# Patient Record
Sex: Female | Born: 1975 | Race: White | Hispanic: No | Marital: Single | Smoking: Current every day smoker
Health system: Southern US, Community
[De-identification: ages and names within clinical notes are randomized; demographics above are authoritative.]

## PROBLEM LIST (undated history)

## (undated) DIAGNOSIS — E7800 Pure hypercholesterolemia, unspecified: Secondary | ICD-10-CM

## (undated) DIAGNOSIS — I499 Cardiac arrhythmia, unspecified: Secondary | ICD-10-CM

## (undated) DIAGNOSIS — I10 Essential (primary) hypertension: Secondary | ICD-10-CM

## (undated) DIAGNOSIS — Z91199 Patient's noncompliance with other medical treatment and regimen due to unspecified reason: Secondary | ICD-10-CM

## (undated) DIAGNOSIS — F319 Bipolar disorder, unspecified: Secondary | ICD-10-CM

## (undated) DIAGNOSIS — F329 Major depressive disorder, single episode, unspecified: Secondary | ICD-10-CM

## (undated) DIAGNOSIS — Z9119 Patient's noncompliance with other medical treatment and regimen: Secondary | ICD-10-CM

## (undated) DIAGNOSIS — F209 Schizophrenia, unspecified: Secondary | ICD-10-CM

## (undated) DIAGNOSIS — J45909 Unspecified asthma, uncomplicated: Secondary | ICD-10-CM

## (undated) DIAGNOSIS — F419 Anxiety disorder, unspecified: Secondary | ICD-10-CM

## (undated) DIAGNOSIS — F32A Depression, unspecified: Secondary | ICD-10-CM

## (undated) DIAGNOSIS — I4891 Unspecified atrial fibrillation: Secondary | ICD-10-CM

## (undated) HISTORY — PX: TONSILLECTOMY: SUR1361

---
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
## 2002-04-27 ENCOUNTER — Emergency Department (HOSPITAL_COMMUNITY): Admission: EM | Admit: 2002-04-27 | Discharge: 2002-04-28 | Payer: Self-pay

## 2002-08-14 ENCOUNTER — Emergency Department (HOSPITAL_COMMUNITY): Admission: EM | Admit: 2002-08-14 | Discharge: 2002-08-14 | Payer: Self-pay | Admitting: Emergency Medicine

## 2002-11-05 ENCOUNTER — Emergency Department (HOSPITAL_COMMUNITY): Admission: EM | Admit: 2002-11-05 | Discharge: 2002-11-05 | Payer: Self-pay | Admitting: Emergency Medicine

## 2002-11-22 ENCOUNTER — Ambulatory Visit (HOSPITAL_COMMUNITY): Admission: RE | Admit: 2002-11-22 | Discharge: 2002-11-22 | Payer: Self-pay | Admitting: Orthopaedic Surgery

## 2003-05-01 ENCOUNTER — Emergency Department (HOSPITAL_COMMUNITY): Admission: EM | Admit: 2003-05-01 | Discharge: 2003-05-01 | Payer: Self-pay | Admitting: Emergency Medicine

## 2003-11-20 ENCOUNTER — Emergency Department (HOSPITAL_COMMUNITY): Admission: EM | Admit: 2003-11-20 | Discharge: 2003-11-21 | Payer: Self-pay | Admitting: Emergency Medicine

## 2004-02-17 ENCOUNTER — Emergency Department (HOSPITAL_COMMUNITY): Admission: EM | Admit: 2004-02-17 | Discharge: 2004-02-17 | Payer: Self-pay | Admitting: Emergency Medicine

## 2004-10-11 ENCOUNTER — Emergency Department (HOSPITAL_COMMUNITY): Admission: EM | Admit: 2004-10-11 | Discharge: 2004-10-11 | Payer: Self-pay | Admitting: Emergency Medicine

## 2005-02-23 ENCOUNTER — Emergency Department (HOSPITAL_COMMUNITY): Admission: EM | Admit: 2005-02-23 | Discharge: 2005-02-24 | Payer: Self-pay | Admitting: *Deleted

## 2005-05-16 ENCOUNTER — Emergency Department (HOSPITAL_COMMUNITY): Admission: EM | Admit: 2005-05-16 | Discharge: 2005-05-16 | Payer: Self-pay | Admitting: Emergency Medicine

## 2008-03-27 ENCOUNTER — Emergency Department (HOSPITAL_COMMUNITY): Admission: EM | Admit: 2008-03-27 | Discharge: 2008-03-27 | Payer: Self-pay | Admitting: Emergency Medicine

## 2008-08-11 ENCOUNTER — Emergency Department (HOSPITAL_COMMUNITY): Admission: EM | Admit: 2008-08-11 | Discharge: 2008-08-11 | Payer: Self-pay | Admitting: Emergency Medicine

## 2008-11-11 ENCOUNTER — Emergency Department (HOSPITAL_COMMUNITY): Admission: EM | Admit: 2008-11-11 | Discharge: 2008-11-11 | Payer: Self-pay | Admitting: Emergency Medicine

## 2009-11-25 ENCOUNTER — Emergency Department (HOSPITAL_COMMUNITY): Admission: EM | Admit: 2009-11-25 | Discharge: 2009-11-25 | Payer: Self-pay | Admitting: Emergency Medicine

## 2010-04-22 ENCOUNTER — Emergency Department (HOSPITAL_COMMUNITY): Admission: EM | Admit: 2010-04-22 | Discharge: 2010-04-22 | Payer: Self-pay | Admitting: Emergency Medicine

## 2010-07-10 ENCOUNTER — Emergency Department (HOSPITAL_COMMUNITY): Admission: EM | Admit: 2010-07-10 | Discharge: 2010-07-10 | Payer: Self-pay | Admitting: Emergency Medicine

## 2010-11-20 LAB — URINE MICROSCOPIC-ADD ON

## 2010-11-20 LAB — URINALYSIS, ROUTINE W REFLEX MICROSCOPIC
Glucose, UA: 500 mg/dL — AB
Leukocytes, UA: NEGATIVE
Protein, ur: 100 mg/dL — AB
Specific Gravity, Urine: >1.03 — ABNORMAL HIGH (ref 1.005–1.030)
Urobilinogen, UA: 0.2 mg/dL (ref 0.0–1.0)

## 2010-11-20 LAB — DIFFERENTIAL
Basophils Absolute: 0 10*3/uL (ref 0.0–0.1)
Basophils Relative: 0 % (ref 0–1)
Eosinophils Absolute: 0.8 10*3/uL — ABNORMAL HIGH (ref 0.0–0.7)
Eosinophils Relative: 6 % — ABNORMAL HIGH (ref 0–5)
Monocytes Absolute: 0.4 10*3/uL (ref 0.1–1.0)
Monocytes Relative: 3 % (ref 3–12)
Neutro Abs: 9.4 10*3/uL — ABNORMAL HIGH (ref 1.7–7.7)

## 2010-11-20 LAB — CBC
HCT: 45.7 % (ref 36.0–46.0)
Hemoglobin: 15.8 g/dL — ABNORMAL HIGH (ref 12.0–15.0)
MCH: 30.6 pg (ref 26.0–34.0)
MCHC: 34.6 g/dL (ref 30.0–36.0)
RDW: 14.5 % (ref 11.5–15.5)

## 2010-11-20 LAB — BASIC METABOLIC PANEL
BUN: 11 mg/dL (ref 6–23)
CO2: 22 mEq/L (ref 19–32)
GFR calc non Af Amer: >60 mL/min (ref >60)
Glucose, Bld: 265 mg/dL — ABNORMAL HIGH (ref 70–99)
Potassium: 4 mEq/L (ref 3.5–5.1)
Sodium: 135 mEq/L (ref 135–145)

## 2010-11-30 LAB — URINE MICROSCOPIC-ADD ON

## 2010-11-30 LAB — URINALYSIS, ROUTINE W REFLEX MICROSCOPIC
Bilirubin Urine: NEGATIVE
Glucose, UA: 100 mg/dL — AB
Ketones, ur: NEGATIVE mg/dL
Leukocytes, UA: NEGATIVE
Protein, ur: 30 mg/dL — AB
pH: 5.5 (ref 5.0–8.0)

## 2010-12-04 ENCOUNTER — Emergency Department (HOSPITAL_COMMUNITY)
Admission: EM | Admit: 2010-12-04 | Discharge: 2010-12-04 | Disposition: A | Discharge status: Home / Self Care | Payer: Self-pay | Attending: Emergency Medicine | Admitting: Emergency Medicine

## 2010-12-04 DIAGNOSIS — R220 Localized swelling, mass and lump, head: Secondary | ICD-10-CM | POA: Insufficient documentation

## 2010-12-04 DIAGNOSIS — E119 Type 2 diabetes mellitus without complications: Secondary | ICD-10-CM | POA: Insufficient documentation

## 2010-12-04 DIAGNOSIS — J45909 Unspecified asthma, uncomplicated: Secondary | ICD-10-CM | POA: Insufficient documentation

## 2010-12-04 DIAGNOSIS — K029 Dental caries, unspecified: Secondary | ICD-10-CM | POA: Insufficient documentation

## 2010-12-04 DIAGNOSIS — I10 Essential (primary) hypertension: Secondary | ICD-10-CM | POA: Insufficient documentation

## 2010-12-04 DIAGNOSIS — Z79899 Other long term (current) drug therapy: Secondary | ICD-10-CM | POA: Insufficient documentation

## 2011-11-22 ENCOUNTER — Encounter (HOSPITAL_COMMUNITY): Payer: Self-pay | Admitting: Emergency Medicine

## 2011-11-22 ENCOUNTER — Emergency Department (HOSPITAL_COMMUNITY)
Admission: EM | Admit: 2011-11-22 | Discharge: 2011-11-22 | Disposition: A | Discharge status: Home / Self Care | Payer: Self-pay | Attending: Emergency Medicine | Admitting: Emergency Medicine

## 2011-11-22 DIAGNOSIS — E119 Type 2 diabetes mellitus without complications: Secondary | ICD-10-CM | POA: Insufficient documentation

## 2011-11-22 DIAGNOSIS — L02219 Cutaneous abscess of trunk, unspecified: Secondary | ICD-10-CM | POA: Insufficient documentation

## 2011-11-22 DIAGNOSIS — R109 Unspecified abdominal pain: Secondary | ICD-10-CM | POA: Insufficient documentation

## 2011-11-22 DIAGNOSIS — F17200 Nicotine dependence, unspecified, uncomplicated: Secondary | ICD-10-CM | POA: Insufficient documentation

## 2011-11-22 DIAGNOSIS — L0291 Cutaneous abscess, unspecified: Secondary | ICD-10-CM

## 2011-11-22 MED ORDER — DOXYCYCLINE HYCLATE 100 MG PO TABS
100.0000 mg | ORAL_TABLET | Freq: Once | ORAL | Status: AC
  Administered 2011-11-22: 100 mg via ORAL
  Filled 2011-11-22: qty 1

## 2011-11-22 MED ORDER — LIDOCAINE-EPINEPHRINE (PF) 2 %-1:200000 IJ SOLN
INTRAMUSCULAR | Status: AC
  Administered 2011-11-22: 20 mL
  Filled 2011-11-22: qty 20

## 2011-11-22 MED ORDER — HYDROCODONE-ACETAMINOPHEN 5-325 MG PO TABS: 1.0000 | ORAL_TABLET | Freq: Four times a day (QID) | ORAL | Status: AC | PRN

## 2011-11-22 MED ORDER — HYDROCODONE-ACETAMINOPHEN 5-325 MG PO TABS
1.0000 | ORAL_TABLET | Freq: Once | ORAL | Status: AC
  Administered 2011-11-22: 1 via ORAL
  Filled 2011-11-22: qty 1

## 2011-11-22 MED ORDER — DOXYCYCLINE HYCLATE 100 MG PO CAPS: 100.0000 mg | ORAL_CAPSULE | Freq: Two times a day (BID) | ORAL | Status: DC

## 2011-11-22 MED ORDER — LIDOCAINE-EPINEPHRINE (PF) 2 %-1:200000 IJ SOLN
20.0000 mL | Freq: Once | INTRAMUSCULAR | Status: AC
  Administered 2011-11-22: 20 mL

## 2011-11-22 MED ORDER — IBUPROFEN 800 MG PO TABS
800.0000 mg | ORAL_TABLET | Freq: Once | ORAL | Status: AC
  Administered 2011-11-22: 800 mg via ORAL
  Filled 2011-11-22: qty 1

## 2011-11-22 NOTE — Discharge Instructions (Signed)
Abscess An abscess (boil or furuncle) is an infected area that contains a collection of pus.  SYMPTOMS Signs and symptoms of an abscess include pain, tenderness, redness, or hardness. You may feel a moveable soft area under your skin. An abscess can occur anywhere in the body.  TREATMENT  A surgical cut (incision) may be made over your abscess to drain the pus. Gauze may be packed into the space or a drain may be looped through the abscess cavity (pocket). This provides a drain that will allow the cavity to heal from the inside outwards. The abscess may be painful for a few days, but should feel much better if it was drained.  Your abscess, if seen early, may not have localized and may not have been drained. If not, another appointment may be required if it does not get better on its own or with medications. HOME CARE INSTRUCTIONS   Only take over-the-counter or prescription medicines for pain, discomfort, or fever as directed by your caregiver.   Take your antibiotics as directed if they were prescribed. Finish them even if you start to feel better.   Keep the skin and clothes clean around your abscess.   If the abscess was drained, you will need to use gauze dressing to collect any draining pus. Dressings will typically need to be changed 3 or more times a day.   The infection may spread by skin contact with others. Avoid skin contact as much as possible.   Practice good hygiene. This includes regular hand washing, cover any draining skin lesions, and do not share personal care items.   If you participate in sports, do not share athletic equipment, towels, whirlpools, or personal care items. Shower after every practice or tournament.   If a draining area cannot be adequately covered:   Do not participate in sports.   Children should not participate in day care until the wound has healed or drainage stops.   If your caregiver has given you a follow-up appointment, it is very important  to keep that appointment. Not keeping the appointment could result in a much worse infection, chronic or permanent injury, pain, and disability. If there is any problem keeping the appointment, you must call back to this facility for assistance.  SEEK MEDICAL CARE IF:   You develop increased pain, swelling, redness, drainage, or bleeding in the wound site.   You develop signs of generalized infection including muscle aches, chills, fever, or a general ill feeling.   You have an oral temperature above 102 F (38.9 C).  MAKE SURE YOU:   Understand these instructions.   Will watch your condition.   Will get help right away if you are not doing well or get worse.  Document Released: 06/03/2005 Document Revised: 08/13/2011 Document Reviewed: 03/27/2008 [facility] Patient Information 2012 [place], [place].Heat Therapy Your caregiver advises heat therapy for your condition. Heat applications help reduce pain and muscle spasm around injuries or areas of inflammation. They also increase blood flow to the area which can speed healing. Moist heat is commonly used to help heal skin infections. Heat treatments should be used for about 30-40 minutes every 2-4 hours. Shorter treatments should be used if there is discomfort. Different forms of heat therapy are:  Warm water - Use a basin or tub filled with heated water; change it often to keep the water hot. The water temperature should not be uncomfortable to the skin.   Hot packs - Use several bath towels soaked in hot  water and lightly wrung out. These should be changed every 5-10 minutes. You can buy commercially-available packs that provide more sustained heat. Hot water bottles are not recommended because they give only a small amount of heat.   Electric heating pads - These may be used for dry heat only. Do not use wet material around a regular heating pad because of the risk of electrical shock. Do not leave heating pads on for long periods as they can  burn the skin or cause permanent discoloration. Do not lie on top of a heating pad because, again, this can cause a burn.   Heat lamps - Use an infrared light. Keep the bulb 15-25 inches from the skin. Watch for signs of excessive heat (blotchy areas will appear).  Be cautious with heat therapy to avoid burning the skin. You should not use heat therapy without careful medical supervision if you have: circulation problems, numbness or unusual swelling in the area to be treated. Document Released: 08/24/2005 Document Revised: 08/13/2011 Document Reviewed: 02/19/2007 [facility] Patient Information 2012 [place], [place].   Take the meds as directed.  Take ibuprofen 800 mg every 8 hrs with food.  Remove 1 inch of packing daily until gone.  Apply warm compresses several times daily.  Return as needed.

## 2011-11-22 NOTE — ED Notes (Signed)
Dressing applied to wound.  

## 2011-11-22 NOTE — ED Provider Notes (Signed)
History     CSN: [id number]  Arrival date & time 11/22/11  1402   First MD Initiated Contact with Patient 11/22/11 1443      Chief Complaint  Patient presents with  . Abscess    (Consider location/radiation/quality/duration/timing/severity/associated sxs/prior treatment) Patient is a 36 y.o. female presenting with abscess. The history is provided by the patient and the police. No language interpreter was used.  Abscess  This is a new problem. Episode onset: 3 days ago. The problem has been gradually worsening. Affected Location: r groin crease. The abscess is characterized by redness, painfulness and draining. The abscess first occurred at home. Pertinent negatives include no fever. There were no sick contacts. She has received no recent medical care.    Past Medical History  Diagnosis Date  . Diabetes mellitus     Past Surgical History  Procedure Date  . Cesarean section   . Tonsillectomy     No family history on file.  History  Substance Use Topics  . Smoking status: Current Everyday Smoker  . Smokeless tobacco: Not on file  . Alcohol Use: No    OB History    Grav Para Term Preterm Abortions TAB SAB Ect Mult Living                  Review of Systems  Constitutional: Negative for fever.  Skin:       Abscess   All other systems reviewed and are negative.    Allergies  Sulfa antibiotics  Home Medications   Current Outpatient Rx  Name Route Sig Dispense Refill  . DOXYCYCLINE HYCLATE 100 MG PO CAPS Oral Take 1 capsule (100 mg total) by mouth 2 (two) times daily. 20 capsule 0  . HYDROCODONE-ACETAMINOPHEN 5-325 MG PO TABS Oral Take 1 tablet by mouth every 6 (six) hours as needed for pain. 20 tablet 0    BP 129/82  Pulse 93  Temp(Src) 97.8 F (36.6 C) (Oral)  Resp 18  Ht 5\' 6"  (1.676 m)  Wt 220 lb (99.791 kg)  BMI 35.51 kg/m2  SpO2 100%  LMP 11/13/2011  Physical Exam  Nursing note and vitals reviewed. Constitutional: She is oriented to person,  place, and time. She appears well-developed and well-nourished. No distress.  HENT:  Head: Normocephalic and atraumatic.  Eyes: EOM are normal.  Neck: Normal range of motion.  Cardiovascular: Normal rate, regular rhythm and normal heart sounds.   Pulmonary/Chest: Effort normal and breath sounds normal.  Abdominal: Soft. She exhibits no distension. There is no tenderness.  Musculoskeletal: Normal range of motion. She exhibits tenderness.       Legs: Neurological: She is alert and oriented to person, place, and time.  Skin: Skin is warm and dry.  Psychiatric: She has a normal mood and affect. Judgment normal.    ED Course  INCISION AND DRAINAGE Date/Time: 11/22/2011 3:30 PM Performed by: Worthy Rancher Authorized by: Worthy Rancher Consent: Verbal consent obtained. Written consent not obtained. Risks and benefits: risks, benefits and alternatives were discussed Consent given by: patient Patient understanding: patient states understanding of the procedure being performed Patient consent: the patient's understanding of the procedure matches consent given Imaging studies: imaging studies not available Required items: required blood products, implants, devices, and special equipment available Patient identity confirmed: verbally with patient Type: abscess Body area: trunk (R groin crease) Anesthesia: local infiltration Local anesthetic: lidocaine 2% with epinephrine Anesthetic total: 3 ml Patient sedated: no Scalpel size: 11 Needle gauge: 25 ga. Incision  type: single straight Complexity: simple Drainage: purulent Drainage amount: copious Wound treatment: drain placed Packing material: 1/4 in iodoform gauze (~ 6 inches inserted.) Patient tolerance: Patient tolerated the procedure well with no immediate complications.   (including critical care time)  Labs Reviewed - No data to display No results found.   1. Abscess       MDM  rx-doxy rx-hydrocodone OTC  ibuprofen Warm compresses Return prn.        Worthy Rancher, PA 11/22/11 1601  Worthy Rancher, PA 11/22/11 [phone]

## 2011-11-22 NOTE — ED Notes (Signed)
Pt presents with large red raised abscess to left thigh/groin. Center of abscess is black. Pt states abscess started approx 1 week ago. Pt reports sharp pain to site. Site draining since pt arrived in treatment room. Pt sitting up in stretcher. Stretcher in low locked position. Side rail up for pt safety. Call light within reach. Education on plan of care provided. Pt verbalized understanding. Awaiting PA evaluation on plan of care provided. Will continue to monitor.

## 2011-11-22 NOTE — ED Notes (Signed)
Pt a/ox4. Resp even and unlabored. NAD at this time. D/C instructions reviewed with pt. Pt verbalized understanding. Pt ambulated to lobby with steady gate.  

## 2011-11-22 NOTE — ED Notes (Signed)
Pt c/o abscess to right groin.

## 2011-11-26 NOTE — ED Provider Notes (Signed)
Medical screening examination/treatment/procedure(s) were performed by non-physician practitioner and as supervising physician I was immediately available for consultation/collaboration.   Jasani Lengel L Arieonna Medine, MD 11/26/11 1138 

## 2012-11-11 ENCOUNTER — Other Ambulatory Visit (HOSPITAL_COMMUNITY): Payer: Self-pay | Admitting: Nurse Practitioner

## 2012-11-11 DIAGNOSIS — N6452 Nipple discharge: Secondary | ICD-10-CM

## 2012-11-16 ENCOUNTER — Encounter (HOSPITAL_COMMUNITY): Payer: Self-pay

## 2012-11-21 ENCOUNTER — Encounter (HOSPITAL_COMMUNITY): Payer: Self-pay | Admitting: *Deleted

## 2012-11-21 ENCOUNTER — Emergency Department (HOSPITAL_COMMUNITY)
Admission: EM | Admit: 2012-11-21 | Discharge: 2012-11-21 | Disposition: A | Discharge status: Home / Self Care | Payer: Self-pay | Attending: Emergency Medicine | Admitting: Emergency Medicine

## 2012-11-21 ENCOUNTER — Emergency Department (HOSPITAL_COMMUNITY): Payer: Self-pay

## 2012-11-21 DIAGNOSIS — Z79899 Other long term (current) drug therapy: Secondary | ICD-10-CM | POA: Insufficient documentation

## 2012-11-21 DIAGNOSIS — R079 Chest pain, unspecified: Secondary | ICD-10-CM | POA: Insufficient documentation

## 2012-11-21 DIAGNOSIS — R109 Unspecified abdominal pain: Secondary | ICD-10-CM

## 2012-11-21 DIAGNOSIS — R110 Nausea: Secondary | ICD-10-CM | POA: Insufficient documentation

## 2012-11-21 DIAGNOSIS — M545 Low back pain, unspecified: Secondary | ICD-10-CM | POA: Insufficient documentation

## 2012-11-21 DIAGNOSIS — R059 Cough, unspecified: Secondary | ICD-10-CM | POA: Insufficient documentation

## 2012-11-21 DIAGNOSIS — J45901 Unspecified asthma with (acute) exacerbation: Secondary | ICD-10-CM | POA: Insufficient documentation

## 2012-11-21 DIAGNOSIS — J3489 Other specified disorders of nose and nasal sinuses: Secondary | ICD-10-CM | POA: Insufficient documentation

## 2012-11-21 DIAGNOSIS — Z3202 Encounter for pregnancy test, result negative: Secondary | ICD-10-CM | POA: Insufficient documentation

## 2012-11-21 DIAGNOSIS — R05 Cough: Secondary | ICD-10-CM | POA: Insufficient documentation

## 2012-11-21 DIAGNOSIS — R1012 Left upper quadrant pain: Secondary | ICD-10-CM | POA: Insufficient documentation

## 2012-11-21 DIAGNOSIS — R51 Headache: Secondary | ICD-10-CM | POA: Insufficient documentation

## 2012-11-21 DIAGNOSIS — R002 Palpitations: Secondary | ICD-10-CM | POA: Insufficient documentation

## 2012-11-21 DIAGNOSIS — M25519 Pain in unspecified shoulder: Secondary | ICD-10-CM | POA: Insufficient documentation

## 2012-11-21 DIAGNOSIS — Z794 Long term (current) use of insulin: Secondary | ICD-10-CM | POA: Insufficient documentation

## 2012-11-21 DIAGNOSIS — F17200 Nicotine dependence, unspecified, uncomplicated: Secondary | ICD-10-CM | POA: Insufficient documentation

## 2012-11-21 DIAGNOSIS — E119 Type 2 diabetes mellitus without complications: Secondary | ICD-10-CM | POA: Insufficient documentation

## 2012-11-21 HISTORY — DX: Unspecified asthma, uncomplicated: J45.909

## 2012-11-21 LAB — BASIC METABOLIC PANEL
BUN: 12 mg/dL (ref 6–23)
Calcium: 9 mg/dL (ref 8.4–10.5)
Creatinine, Ser: 0.52 mg/dL (ref 0.50–1.10)
GFR calc Af Amer: >90 mL/min (ref >90)
GFR calc non Af Amer: >90 mL/min (ref >90)
Glucose, Bld: 353 mg/dL — ABNORMAL HIGH (ref 70–99)
Potassium: 3.9 mEq/L (ref 3.5–5.1)

## 2012-11-21 LAB — URINALYSIS, ROUTINE W REFLEX MICROSCOPIC
Bilirubin Urine: NEGATIVE
Ketones, ur: NEGATIVE mg/dL
Leukocytes, UA: NEGATIVE
Nitrite: NEGATIVE
Protein, ur: NEGATIVE mg/dL
pH: 6 (ref 5.0–8.0)

## 2012-11-21 LAB — CBC WITH DIFFERENTIAL/PLATELET
Basophils Relative: 1 % (ref 0–1)
Eosinophils Absolute: 0.7 10*3/uL (ref 0.0–0.7)
Eosinophils Relative: 8 % — ABNORMAL HIGH (ref 0–5)
Hemoglobin: 16.1 g/dL — ABNORMAL HIGH (ref 12.0–15.0)
Lymphs Abs: 2.2 10*3/uL (ref 0.7–4.0)
MCH: 31.8 pg (ref 26.0–34.0)
MCHC: 37.5 g/dL — ABNORMAL HIGH (ref 30.0–36.0)
MCV: 84.6 fL (ref 78.0–100.0)
Monocytes Absolute: 0.3 10*3/uL (ref 0.1–1.0)
Monocytes Relative: 4 % (ref 3–12)
Neutrophils Relative %: 60 % (ref 43–77)
RBC: 5.07 MIL/uL (ref 3.87–5.11)

## 2012-11-21 LAB — HEPATIC FUNCTION PANEL
Albumin: 3.4 g/dL — ABNORMAL LOW (ref 3.5–5.2)
Alkaline Phosphatase: 93 U/L (ref 39–117)
Bilirubin, Direct: 0.1 mg/dL (ref 0.0–0.3)
Indirect Bilirubin: 0.2 mg/dL — ABNORMAL LOW (ref 0.3–0.9)
Total Bilirubin: 0.3 mg/dL (ref 0.3–1.2)

## 2012-11-21 LAB — LIPASE, BLOOD: Lipase: 31 U/L (ref 11–59)

## 2012-11-21 MED ORDER — SODIUM CHLORIDE 0.9 % IV BOLUS (SEPSIS)
250.0000 mL | Freq: Once | INTRAVENOUS | Status: AC
  Administered 2012-11-21: 250 mL via INTRAVENOUS

## 2012-11-21 MED ORDER — PROMETHAZINE HCL 25 MG PO TABS: 25.0000 mg | ORAL_TABLET | Freq: Four times a day (QID) | ORAL | Status: DC | PRN

## 2012-11-21 MED ORDER — HYDROCODONE-ACETAMINOPHEN 5-325 MG PO TABS: 1.0000 | ORAL_TABLET | Freq: Four times a day (QID) | ORAL | Status: DC | PRN

## 2012-11-21 MED ORDER — MORPHINE SULFATE 2 MG/ML IJ SOLN
2.0000 mg | Freq: Once | INTRAMUSCULAR | Status: AC
  Administered 2012-11-21: 2 mg via INTRAVENOUS
  Filled 2012-11-21: qty 1

## 2012-11-21 MED ORDER — SODIUM CHLORIDE 0.9 % IV SOLN
INTRAVENOUS | Status: DC
  Administered 2012-11-21: 18:00:00 via INTRAVENOUS

## 2012-11-21 MED ORDER — IOHEXOL 300 MG/ML  SOLN
100.0000 mL | Freq: Once | INTRAMUSCULAR | Status: AC | PRN
  Administered 2012-11-21: 100 mL via INTRAVENOUS

## 2012-11-21 MED ORDER — ONDANSETRON HCL 4 MG/2ML IJ SOLN
4.0000 mg | Freq: Once | INTRAMUSCULAR | Status: AC
  Administered 2012-11-21: 4 mg via INTRAVENOUS
  Filled 2012-11-21: qty 2

## 2012-11-21 MED ORDER — IOHEXOL 300 MG/ML  SOLN
50.0000 mL | Freq: Once | INTRAMUSCULAR | Status: AC | PRN
  Administered 2012-11-21: 50 mL via ORAL

## 2012-11-21 NOTE — ED Notes (Signed)
Chest pain with pain lt shoulder, with LUQ pain.  Sob, headache, cough nonproductive.

## 2012-11-21 NOTE — ED Provider Notes (Signed)
History    This chart was scribed for Shelda Jakes, MD by Marlyne Beards, ED Scribe. The patient was seen in room APA12/[facility]12. Patient's care was started at 5:05 PM.    CSN: [id number]  Arrival date & time 11/21/12  1628   First MD Initiated Contact with Patient 11/21/12 1705      Chief Complaint  Patient presents with  . Chest Pain    (Consider location/radiation/quality/duration/timing/severity/associated sxs/prior treatment) Patient is a 37 y.o. female presenting with chest pain. The history is provided by the patient. No language interpreter was used.  Chest Pain Pain location:  L chest Pain quality: aching   Pain radiates to:  L arm Pain radiates to the back: no   Pain severity:  Mild Onset quality:  Gradual Duration:  15 minutes Timing:  Intermittent Progression:  Unchanged Associated symptoms: abdominal pain, back pain (lower back), cough, headache, nausea and shortness of breath   Associated symptoms: no fever and not vomiting    Shari Collins is a 37 y.o. female who presents to the Emergency Department complaining of mild intermittent LUQ chest pain onset yesterday around 5 PM. Pt states the chest pain is "achey"and is exacerbated by movement. Pt states her chest pain radiates down into her left arm. She states that chest pain episodes last for about 15 min and are gone for periods of 30 minutes at a time. Pt complains of associated headaches, shortness of breath, nausea, heart palpitations and a nonproductive cough. Pt denies fever, chills, vomiting, diarrhea, weakness and any other associated symptoms. Pt's primary care is at the health department.    Past Medical History  Diagnosis Date  . Diabetes mellitus   . Asthma     Past Surgical History  Procedure Laterality Date  . Cesarean section    . Tonsillectomy      History reviewed. No pertinent family history.  History  Substance Use Topics  . Smoking status: Current Every Day Smoker  . Smokeless  tobacco: Not on file  . Alcohol Use: No    OB History   Grav Para Term Preterm Abortions TAB SAB Ect Mult Living                  Review of Systems  Constitutional: Negative for fever and chills.  HENT: Positive for rhinorrhea. Negative for congestion.   Eyes: Negative for visual disturbance.  Respiratory: Positive for cough and shortness of breath.   Cardiovascular: Positive for chest pain.  Gastrointestinal: Positive for nausea and abdominal pain. Negative for vomiting and diarrhea.  Genitourinary: Negative for dysuria and hematuria.  Musculoskeletal: Positive for back pain (lower back).  Skin: Negative for rash.  Neurological: Positive for headaches.  Hematological: Does not bruise/bleed easily.  Psychiatric/Behavioral: Negative for confusion.    Allergies  Sulfa antibiotics  Home Medications   Current Outpatient Rx  Name  Route  Sig  Dispense  Refill  . citalopram (CELEXA) 40 MG tablet   Oral   Take 40 mg by mouth daily.         . clonazePAM (KLONOPIN) 0.5 MG tablet   Oral   Take 0.5 mg by mouth 3 (three) times daily.         Marland Kitchen gemfibrozil (LOPID) 600 MG tablet   Oral   Take 600 mg by mouth 2 (two) times daily before a meal.         . insulin detemir (LEVEMIR) 100 UNIT/ML injection   Subcutaneous   Inject 10 Units  into the skin at bedtime.         Marland Kitchen lisinopril (PRINIVIL,ZESTRIL) 5 MG tablet   Oral   Take 5 mg by mouth daily.         . metFORMIN (GLUCOPHAGE) 500 MG tablet   Oral   Take 500 mg by mouth daily.         . traZODone (DESYREL) 100 MG tablet   Oral   Take 100 mg by mouth at bedtime.         Marland Kitchen HYDROcodone-acetaminophen (NORCO/VICODIN) 5-325 MG per tablet   Oral   Take 1-2 tablets by mouth every 6 (six) hours as needed for pain.   14 tablet   0   . promethazine (PHENERGAN) 25 MG tablet   Oral   Take 1 tablet (25 mg total) by mouth every 6 (six) hours as needed for nausea.   12 tablet   0     BP 135/84  Pulse 95   Temp(Src) 97.9 F (36.6 C) (Oral)  Resp 18  Ht 5\' 6"  (1.676 m)  Wt 220 lb (99.791 kg)  BMI 35.53 kg/m2  SpO2 100%  LMP 10/31/2012  Physical Exam  Nursing note and vitals reviewed. Constitutional: She is oriented to person, place, and time. She appears well-developed and well-nourished. No distress.  HENT:  Head: Normocephalic and atraumatic.  Mouth/Throat: Oropharynx is clear and moist. No oropharyngeal exudate.  Eyes: Conjunctivae and EOM are normal. Pupils are equal, round, and reactive to light. No scleral icterus.  Neck: Normal range of motion. Neck supple. No tracheal deviation present.  Cardiovascular: Normal rate, regular rhythm and normal heart sounds.   No murmur heard. Regular pulse is 2+  Pulmonary/Chest: Effort normal and breath sounds normal. No respiratory distress.  chet tenderness upon palpation on left side  Abdominal: Soft. Bowel sounds are normal. There is tenderness. There is no guarding.  Tenderness in LUQ but no gaurding  Musculoskeletal: Normal range of motion. She exhibits tenderness.  Pain in left shoulder upon movement but no deformity.  Lymphadenopathy:    She has no cervical adenopathy.  Neurological: She is alert and oriented to person, place, and time. No cranial nerve deficit. She exhibits normal muscle tone. Coordination normal.  Skin: Skin is warm and dry.  Psychiatric: She has a normal mood and affect. Her behavior is normal.    ED Course  Procedures (including critical care time) DIAGNOSTIC STUDIES: Oxygen Saturation is 100% on room air, normal by my interpretation.    COORDINATION OF CARE: 5:35 PM Discussed ED treatment with pt and pt agrees.     Labs Reviewed  CBC WITH DIFFERENTIAL - Abnormal; Notable for the following:    Hemoglobin 16.1 (*)    MCHC 37.5 (*)    Eosinophils Relative 8 (*)    All other components within normal limits  BASIC METABOLIC PANEL - Abnormal; Notable for the following:    Sodium 130 (*)    Chloride 95 (*)     Glucose, Bld 353 (*)    All other components within normal limits  HEPATIC FUNCTION PANEL - Abnormal; Notable for the following:    Albumin 3.4 (*)    All other components within normal limits  URINALYSIS, ROUTINE W REFLEX MICROSCOPIC - Abnormal; Notable for the following:    Glucose, UA >1000 (*)    All other components within normal limits  TROPONIN I  LIPASE, BLOOD  PREGNANCY, URINE  URINE MICROSCOPIC-ADD ON   Ct Abdomen Pelvis W Contrast  11/21/2012  *  RADIOLOGY REPORT*  Clinical Data: Left upper quadrant pain.  Chest pain. Shortness of breath.  CT ABDOMEN AND PELVIS WITH CONTRAST  Technique:  Multidetector CT imaging of the abdomen and pelvis was performed following the standard protocol during bolus administration of intravenous contrast.  Contrast: 50mL OMNIPAQUE IOHEXOL 300 MG/ML  SOLN, OMNIPAQUE IOHEXOL 300 MG/ML  SOLN  Comparison: 04/22/2010 and 11/25/2009.  Findings: Tiny lung base nodules measuring up to 2.4 mm greater on the right.  These areas may not been previously imaged.  If the patient is at high risk for bronchogenic carcinoma, follow- up chest CT at 1 year is recommended.  If the patient is at low risk, no follow-up is needed.  This recommendation follows the consensus statement: Guidelines for Management of Small Pulmonary Nodules Detected on CT Scans:  A Statement from the Fleischner Society as published in Radiology 2005; 237:395-400.  Right adrenal low density lesion has increased in size now measuring 2.5 x 3.1 x 3.3 cm versus prior 2 x 2.8 x 3 cm.  Consider consultation with interventional radiologist on elective basis to determine further workup.  Left adrenal hyperplasia/tiny adenoma.  Splenic 1 cm low density structures suggestive of a cyst which and is probably better visualized secondary to use contrast.  Fatty infiltration of the liver without focal hepatic lesion.  No calcified gallstones.  Corpus luteum cyst on the left.  Decompressed noncontrast filled  urinary bladder with limit evaluation.  Degenerative changes most notable L5-S1 without bony destructive lesion noted.  Top normal size right external iliac lymph node.  No abdominal aortic aneurysm.  No extraluminal bowel inflammatory process, free fluid or free air.  IMPRESSION: No extraluminal bowel inflammatory process, free fluid or free air.  Corpus luteum cyst on the left.  Degenerative changes thoracic lumbar spine most notable L5-S1.  Enlarging right adrenal lesion.  Consultation with interventional radiologist on elective basis may be considered to determine how this should be worked up.  Fatty infiltration of the liver.  Splenic 1 cm low density structure probably a cyst and an incidental finding. This is felt unlikely to be the cause of the patient's left upper quadrant pain but can be reevaluated on follow- up if there are progressive left upper quadrant symptoms.  Tiny lung nodules with follow up as noted above   Original Report Authenticated By: Lacy Duverney, M.D.    Dg Chest Portable 1 View  11/21/2012  *RADIOLOGY REPORT*  Clinical Data: Chest pain.  Current history of diabetes.  Smoker with asthma.  PORTABLE CHEST - 1 VIEW 11/21/2012 1722 hours:  Comparison: Two-view chest x-ray 07/10/2010.  Findings: Cardiac silhouette upper normal in size for the AP portable technique, unchanged.  Hilar and mediastinal contours otherwise unremarkable.  Mildly prominent bronchovascular markings diffusely, unchanged.  No new pulmonary parenchymal abnormalities. No visible pleural effusions.  IMPRESSION: Stable mild changes of chronic bronchitis and/or asthma.  No acute cardiopulmonary disease.   Original Report Authenticated By: Hulan Saas, M.D.     Date: 11/21/2012  Rate: 90  Rhythm: normal sinus rhythm  QRS Axis: left  Intervals: normal  ST/T Wave abnormalities: nonspecific ST/T changes  Conduction Disutrbances:none  Narrative Interpretation:   Old EKG Reviewed: none available  Results for  orders placed during the hospital encounter of 11/21/12  CBC WITH DIFFERENTIAL      Result Value Range   WBC 8.3  4.0 - 10.5 K/uL   RBC 5.07  3.87 - 5.11 MIL/uL   Hemoglobin 16.1 (*) 12.0 - 15.0  g/dL   HCT 28.4  13.2 - 44.0 %   MCV 84.6  78.0 - 100.0 fL   MCH 31.8  26.0 - 34.0 pg   MCHC 37.5 (*) 30.0 - 36.0 g/dL   RDW 10.2  72.5 - 36.6 %   Platelets 268  150 - 400 K/uL   Neutrophils Relative 60  43 - 77 %   Neutro Abs 5.0  1.7 - 7.7 K/uL   Lymphocytes Relative 27  12 - 46 %   Lymphs Abs 2.2  0.7 - 4.0 K/uL   Monocytes Relative 4  3 - 12 %   Monocytes Absolute 0.3  0.1 - 1.0 K/uL   Eosinophils Relative 8 (*) 0 - 5 %   Eosinophils Absolute 0.7  0.0 - 0.7 K/uL   Basophils Relative 1  0 - 1 %   Basophils Absolute 0.1  0.0 - 0.1 K/uL  BASIC METABOLIC PANEL      Result Value Range   Sodium 130 (*) 135 - 145 mEq/L   Potassium 3.9  3.5 - 5.1 mEq/L   Chloride 95 (*) 96 - 112 mEq/L   CO2 22  19 - 32 mEq/L   Glucose, Bld 353 (*) 70 - 99 mg/dL   BUN 12  6 - 23 mg/dL   Creatinine, Ser 4.40  0.50 - 1.10 mg/dL   Calcium 9.0  8.4 - 34.7 mg/dL   GFR calc non Af Amer >90  >90 mL/min   GFR calc Af Amer >90  >90 mL/min  TROPONIN I      Result Value Range   Troponin I <0.30  <0.30 ng/mL  HEPATIC FUNCTION PANEL      Result Value Range   Total Protein 6.4  6.0 - 8.3 g/dL   Albumin 3.4 (*) 3.5 - 5.2 g/dL   AST 15  0 - 37 U/L   ALT 20  0 - 35 U/L   Alkaline Phosphatase 93  39 - 117 U/L   Total Bilirubin 0.3  0.3 - 1.2 mg/dL   Bilirubin, Direct PENDING  0.0 - 0.3 mg/dL   Indirect Bilirubin PENDING  0.3 - 0.9 mg/dL  LIPASE, BLOOD      Result Value Range   Lipase 31  11 - 59 U/L  URINALYSIS, ROUTINE W REFLEX MICROSCOPIC      Result Value Range   Color, Urine YELLOW  YELLOW   APPearance CLEAR  CLEAR   Specific Gravity, Urine 1.020  1.005 - 1.030   pH 6.0  5.0 - 8.0   Glucose, UA >1000 (*) NEGATIVE mg/dL   Hgb urine dipstick NEGATIVE  NEGATIVE   Bilirubin Urine NEGATIVE  NEGATIVE    Ketones, ur NEGATIVE  NEGATIVE mg/dL   Protein, ur NEGATIVE  NEGATIVE mg/dL   Urobilinogen, UA 0.2  0.0 - 1.0 mg/dL   Nitrite NEGATIVE  NEGATIVE   Leukocytes, UA NEGATIVE  NEGATIVE  PREGNANCY, URINE      Result Value Range   Preg Test, Ur NEGATIVE  NEGATIVE  URINE MICROSCOPIC-ADD ON      Result Value Range   Urine-Other       Value: NO FORMED ELEMENTS SEEN ON URINE MICROSCOPIC EXAMINATION      1. Chest pain   2. Abdominal pain       MDM  Workup without significant findings however there are some concerns that will require followup. Nothing to specifically explain presentation of her symptoms here today other than perhaps a splenic cyst. There is an  enlargement of the right adrenal gland lesion that will require follow up and perhaps interventional radiology to evaluate. Patient will followup with health department and get referrals as needed. Urinalysis was negative for any evidence urinary tract infection renal function is normal. No leukocytosis no significant anemia. Chest x-ray negative for pneumonia pneumothorax or pulmonary edema. Troponin was also negative EKG had no acute changes.  I personally performed the services described in this documentation, which was scribed in my presence. The recorded information has been reviewed and is accurate.         Shelda Jakes, MD 11/21/12 [phone]

## 2012-11-30 ENCOUNTER — Other Ambulatory Visit (HOSPITAL_COMMUNITY): Payer: Self-pay | Admitting: Nurse Practitioner

## 2012-11-30 ENCOUNTER — Ambulatory Visit (HOSPITAL_COMMUNITY)
Admission: RE | Admit: 2012-11-30 | Discharge: 2012-11-30 | Disposition: A | Discharge status: Home / Self Care | Payer: Self-pay | Source: Ambulatory Visit | Attending: Obstetrics and Gynecology | Admitting: Obstetrics and Gynecology

## 2012-11-30 DIAGNOSIS — N6459 Other signs and symptoms in breast: Secondary | ICD-10-CM | POA: Insufficient documentation

## 2012-11-30 DIAGNOSIS — N6452 Nipple discharge: Secondary | ICD-10-CM

## 2012-11-30 DIAGNOSIS — D249 Benign neoplasm of unspecified breast: Secondary | ICD-10-CM | POA: Insufficient documentation

## 2013-07-04 ENCOUNTER — Encounter (HOSPITAL_COMMUNITY): Payer: Self-pay | Admitting: Emergency Medicine

## 2013-07-04 ENCOUNTER — Emergency Department (HOSPITAL_COMMUNITY)
Admission: EM | Admit: 2013-07-04 | Discharge: 2013-07-04 | Disposition: A | Discharge status: Home / Self Care | Payer: Medicaid Other | Attending: Emergency Medicine | Admitting: Emergency Medicine

## 2013-07-04 ENCOUNTER — Other Ambulatory Visit (HOSPITAL_COMMUNITY): Payer: Self-pay | Admitting: Physician Assistant

## 2013-07-04 DIAGNOSIS — R109 Unspecified abdominal pain: Secondary | ICD-10-CM | POA: Insufficient documentation

## 2013-07-04 DIAGNOSIS — Z79899 Other long term (current) drug therapy: Secondary | ICD-10-CM | POA: Insufficient documentation

## 2013-07-04 DIAGNOSIS — R1031 Right lower quadrant pain: Secondary | ICD-10-CM | POA: Insufficient documentation

## 2013-07-04 DIAGNOSIS — K829 Disease of gallbladder, unspecified: Secondary | ICD-10-CM

## 2013-07-04 DIAGNOSIS — R110 Nausea: Secondary | ICD-10-CM | POA: Insufficient documentation

## 2013-07-04 DIAGNOSIS — Z794 Long term (current) use of insulin: Secondary | ICD-10-CM | POA: Insufficient documentation

## 2013-07-04 DIAGNOSIS — E119 Type 2 diabetes mellitus without complications: Secondary | ICD-10-CM | POA: Insufficient documentation

## 2013-07-04 DIAGNOSIS — F17200 Nicotine dependence, unspecified, uncomplicated: Secondary | ICD-10-CM | POA: Insufficient documentation

## 2013-07-04 LAB — COMPREHENSIVE METABOLIC PANEL
ALT: 21 U/L (ref 0–35)
AST: 15 U/L (ref 0–37)
BUN: 14 mg/dL (ref 6–23)
CO2: 26 mEq/L (ref 19–32)
Calcium: 9 mg/dL (ref 8.4–10.5)
Chloride: 99 mEq/L (ref 96–112)
Creatinine, Ser: 0.6 mg/dL (ref 0.50–1.10)
GFR calc non Af Amer: >90 mL/min (ref >90)
Glucose, Bld: 224 mg/dL — ABNORMAL HIGH (ref 70–99)
Sodium: 135 mEq/L (ref 135–145)
Total Bilirubin: 0.3 mg/dL (ref 0.3–1.2)

## 2013-07-04 LAB — CBC
Hemoglobin: 14.3 g/dL (ref 12.0–15.0)
MCV: 88.1 fL (ref 78.0–100.0)
Platelets: 294 10*3/uL (ref 150–400)
RBC: 4.55 MIL/uL (ref 3.87–5.11)
WBC: 11.4 10*3/uL — ABNORMAL HIGH (ref 4.0–10.5)

## 2013-07-04 MED ORDER — GI COCKTAIL ~~LOC~~
30.0000 mL | Freq: Once | ORAL | Status: AC
  Administered 2013-07-04: 30 mL via ORAL
  Filled 2013-07-04: qty 30

## 2013-07-04 MED ORDER — ONDANSETRON 4 MG PO TBDP: ORAL_TABLET | ORAL | Status: DC

## 2013-07-04 NOTE — ED Provider Notes (Signed)
CSN: [id number]     Arrival date & time 07/04/13  1930 History  This chart was scribed for Dagmar Hait, MD by Bennett Scrape, ED Scribe. This patient was seen in room APA01/APA01 and the patient's care was started at 9:00 PM.     Chief Complaint  Patient presents with  . Abdominal Pain    Patient is a 37 y.o. female presenting with abdominal pain. The history is provided by the patient. No language interpreter was used.  Abdominal Pain Pain location:  RLQ Pain radiates to:  Back Onset quality:  Gradual Duration: several months. Timing:  Constant Progression:  Worsening Chronicity:  New Relieved by:  Nothing Worsened by:  Nothing tried Associated symptoms: nausea   Associated symptoms: no diarrhea, no dysuria, no fever and no vomiting     HPI Comments: Shari Collins is a 37 y.o. female who presents to the Emergency Department complaining of RLQ that radiates to the back that has been intermittently for 'the past few months'. She reports that the pain has become a gradually worsening, constant pain with associated nausea over the past 2 days. She denies any modifying factors including eating. She denies any vaginal bleeding or discharge. She was seen by her PMD and was advised to come to the ED for an [place].  She denies any chronic pain or cardiac conditions. She reports one prior c-section but otherwise denies any abdominal surgeries  PCP is Katherene Ponto    Past Medical History  Diagnosis Date  . Diabetes mellitus   . Asthma    Past Surgical History  Procedure Laterality Date  . Cesarean section    . Tonsillectomy     History reviewed. No pertinent family history. History  Substance Use Topics  . Smoking status: Current Every Day Smoker  . Smokeless tobacco: Not on file  . Alcohol Use: No   No OB history provided.  Review of Systems  Constitutional: Negative for fever.  Gastrointestinal: Positive for nausea and abdominal pain. Negative for vomiting and diarrhea.   Genitourinary: Negative for dysuria.  All other systems reviewed and are negative.    Allergies  Bee venom and Sulfa antibiotics  Home Medications   Current Outpatient Rx  Name  Route  Sig  Dispense  Refill  . Choline Fenofibrate (TRILIPIX) 135 MG capsule   Oral   Take 135 mg by mouth every morning.         . citalopram (CELEXA) 20 MG tablet   Oral   Take 20 mg by mouth at bedtime.         Marland Kitchen ibuprofen (ADVIL,MOTRIN) 200 MG tablet   Oral   Take 800 mg by mouth daily as needed for pain.         . Insulin Glargine (LANTUS SOLOSTAR) 100 UNIT/ML SOPN   Subcutaneous   Inject 36 Units into the skin at bedtime.         Marland Kitchen lisinopril (PRINIVIL,ZESTRIL) 10 MG tablet   Oral   Take 10 mg by mouth every morning.         . Multiple Vitamin (MULTIVITAMIN WITH MINERALS) TABS tablet   Oral   Take 1 tablet by mouth daily.         . rosuvastatin (CRESTOR) 20 MG tablet   Oral   Take 20 mg by mouth at bedtime.         . sitaGLIPtin-metformin (JANUMET) 50-500 MG per tablet   Oral   Take 1 tablet by mouth 2 (two)  times daily with a meal.         . traZODone (DESYREL) 150 MG tablet   Oral   Take 150 mg by mouth at bedtime.         . ondansetron (ZOFRAN ODT) 4 MG disintegrating tablet      4mg  ODT q4 hours prn nausea/vomit   10 tablet   0    Triage Vitals: BP 117/64  Pulse 85  Temp(Src) 98.1 F (36.7 C) (Oral)  Resp 20  Ht 5\' 6"  (1.676 m)  Wt 223 lb (101.152 kg)  BMI 36.01 kg/m2  SpO2 97%  LMP 05/31/2013  Physical Exam  Nursing note and vitals reviewed. Constitutional: She is oriented to person, place, and time. She appears well-developed and well-nourished. No distress.  HENT:  Head: Normocephalic and atraumatic.  Eyes: EOM are normal.  Neck: Neck supple. No tracheal deviation present.  Cardiovascular: Normal rate and regular rhythm.   Pulmonary/Chest: Effort normal and breath sounds normal. No respiratory distress.  Abdominal: Soft. There is  tenderness (mild RLQ tenderness). There is no rebound and no guarding.  Musculoskeletal: Normal range of motion.  Neurological: She is alert and oriented to person, place, and time.  Skin: Skin is warm and dry.  Psychiatric: She has a normal mood and affect. Her behavior is normal.    ED Course  Procedures (including critical care time)  Medications  gi cocktail (Maalox,Lidocaine,Donnatal) (30 mLs Oral Given 07/04/13 2127)    DIAGNOSTIC STUDIES: Oxygen Saturation is 97% on room air, normal by my interpretation.    COORDINATION OF CARE: 9:03 PM-Discussed treatment plan which includes GI cocktail, [place] of abdomen, CBC panel, CMP and lipase with pt at bedside and pt agreed to plan.   9:22 PM-Advised pt that [place] has already left today. She has an [place] scheduled tomorrow at 2:15 PM. Advised pt that if she would rather wait until lab results are back before discharge. She states that the GI cocktail helped.  Labs Review Labs Reviewed  CBC - Abnormal; Notable for the following:    WBC 11.4 (*)    All other components within normal limits  COMPREHENSIVE METABOLIC PANEL - Abnormal; Notable for the following:    Glucose, Bld 224 (*)    All other components within normal limits  LIPASE, BLOOD   Imaging Review No results found.  EKG Interpretation   None       MDM   1. Abdominal pain    37 year old female here with right upper quadrant pain for the past 2 months. [place] by PCP for right upper quadrant ultrasound. Patient actually has ultrasound scheduled for tomorrow which is on the dry ordered. Labs with mild white count, otherwise normal. Vision is mild right upper quadrant pain on exam with Murphy sign, however with no fevers, vomiting, feel like this is not consistent with acute cholecystitis. Vitals are stable, I feel she is stable for discharge and return tomorrow for her scan.  I personally performed the services described in this documentation, which was scribed in my presence.  The recorded information has been reviewed and is accurate.    Dagmar Hait, MD 07/05/13 [phone]

## 2013-07-04 NOTE — ED Notes (Signed)
Pt c/o rt sided abd pain for months with nausea. Pt states the pain has gotten worse over last week.

## 2013-07-04 NOTE — ED Notes (Signed)
Pt reports intermittent right side abdominal pain x2 months. Constant for last 2 days. Reports some dark stools, and having to strain to void urine small amounts.

## 2013-07-05 ENCOUNTER — Ambulatory Visit (HOSPITAL_COMMUNITY)
Admission: RE | Admit: 2013-07-05 | Discharge: 2013-07-05 | Disposition: A | Discharge status: Home / Self Care | Payer: Medicaid Other | Source: Ambulatory Visit | Attending: Physician Assistant | Admitting: Physician Assistant

## 2013-07-05 DIAGNOSIS — R1011 Right upper quadrant pain: Secondary | ICD-10-CM

## 2013-07-05 DIAGNOSIS — K7689 Other specified diseases of liver: Secondary | ICD-10-CM | POA: Insufficient documentation

## 2013-07-10 ENCOUNTER — Other Ambulatory Visit (HOSPITAL_COMMUNITY): Payer: Self-pay | Admitting: Physician Assistant

## 2013-07-10 DIAGNOSIS — R1011 Right upper quadrant pain: Secondary | ICD-10-CM

## 2013-07-14 ENCOUNTER — Encounter (HOSPITAL_COMMUNITY): Payer: Self-pay

## 2013-07-14 ENCOUNTER — Ambulatory Visit (HOSPITAL_COMMUNITY)
Admission: RE | Admit: 2013-07-14 | Discharge: 2013-07-14 | Disposition: A | Discharge status: Home / Self Care | Payer: Medicaid Other | Source: Ambulatory Visit | Attending: Physician Assistant | Admitting: Physician Assistant

## 2013-07-14 DIAGNOSIS — R1011 Right upper quadrant pain: Secondary | ICD-10-CM | POA: Insufficient documentation

## 2013-07-14 DIAGNOSIS — E119 Type 2 diabetes mellitus without complications: Secondary | ICD-10-CM | POA: Insufficient documentation

## 2013-07-14 DIAGNOSIS — K7689 Other specified diseases of liver: Secondary | ICD-10-CM | POA: Insufficient documentation

## 2013-07-14 DIAGNOSIS — D3500 Benign neoplasm of unspecified adrenal gland: Secondary | ICD-10-CM | POA: Insufficient documentation

## 2013-07-14 DIAGNOSIS — Q619 Cystic kidney disease, unspecified: Secondary | ICD-10-CM | POA: Insufficient documentation

## 2013-07-14 MED ORDER — GADOBENATE DIMEGLUMINE 529 MG/ML IV SOLN
20.0000 mL | Freq: Once | INTRAVENOUS | Status: AC | PRN
  Administered 2013-07-14: 20 mL via INTRAVENOUS

## 2013-07-14 MED ORDER — SODIUM CHLORIDE 0.9 % IV SOLN
INTRAVENOUS | Status: AC
  Filled 2013-07-14: qty 150

## 2013-07-25 ENCOUNTER — Other Ambulatory Visit (HOSPITAL_COMMUNITY): Payer: Self-pay | Admitting: *Deleted

## 2013-07-25 DIAGNOSIS — R928 Other abnormal and inconclusive findings on diagnostic imaging of breast: Secondary | ICD-10-CM

## 2013-08-02 ENCOUNTER — Ambulatory Visit (HOSPITAL_COMMUNITY)
Admission: RE | Admit: 2013-08-02 | Discharge: 2013-08-02 | Disposition: A | Discharge status: Home / Self Care | Payer: PRIVATE HEALTH INSURANCE | Source: Ambulatory Visit | Attending: *Deleted | Admitting: *Deleted

## 2013-08-02 DIAGNOSIS — N63 Unspecified lump in unspecified breast: Secondary | ICD-10-CM | POA: Insufficient documentation

## 2013-08-02 DIAGNOSIS — R928 Other abnormal and inconclusive findings on diagnostic imaging of breast: Secondary | ICD-10-CM

## 2014-01-04 ENCOUNTER — Emergency Department (HOSPITAL_COMMUNITY)
Admission: EM | Admit: 2014-01-04 | Discharge: 2014-01-04 | Discharge status: Against Medical Advice | Payer: Medicaid Other | Attending: Emergency Medicine | Admitting: Emergency Medicine

## 2014-01-04 ENCOUNTER — Encounter (HOSPITAL_COMMUNITY): Payer: Self-pay | Admitting: Emergency Medicine

## 2014-01-04 DIAGNOSIS — Z008 Encounter for other general examination: Secondary | ICD-10-CM | POA: Insufficient documentation

## 2014-01-04 DIAGNOSIS — J45909 Unspecified asthma, uncomplicated: Secondary | ICD-10-CM | POA: Insufficient documentation

## 2014-01-04 DIAGNOSIS — E119 Type 2 diabetes mellitus without complications: Secondary | ICD-10-CM | POA: Insufficient documentation

## 2014-01-04 DIAGNOSIS — F17200 Nicotine dependence, unspecified, uncomplicated: Secondary | ICD-10-CM | POA: Insufficient documentation

## 2014-01-04 HISTORY — DX: Major depressive disorder, single episode, unspecified: F32.9

## 2014-01-04 HISTORY — DX: Depression, unspecified: F32.A

## 2014-01-04 NOTE — ED Notes (Signed)
Hx of depression. Been off meds lately and having thought of wanting to harm self. Denies any plan.  Tearful in triage

## 2014-01-04 NOTE — ED Notes (Signed)
When pt was place in her room she state she did not want to stay. States she came her voluntary and she wanted to leave. Pt states she has an appointment on 5/15 at [facility]. States her meds have been changed and they are not working and her doctor does not care

## 2014-01-15 ENCOUNTER — Other Ambulatory Visit (HOSPITAL_COMMUNITY): Payer: Self-pay | Admitting: Physician Assistant

## 2014-01-15 DIAGNOSIS — F17200 Nicotine dependence, unspecified, uncomplicated: Secondary | ICD-10-CM

## 2014-01-15 DIAGNOSIS — R918 Other nonspecific abnormal finding of lung field: Secondary | ICD-10-CM

## 2014-01-22 ENCOUNTER — Ambulatory Visit (HOSPITAL_COMMUNITY): Payer: Self-pay

## 2014-02-15 ENCOUNTER — Ambulatory Visit (HOSPITAL_COMMUNITY)
Admission: RE | Admit: 2014-02-15 | Discharge: 2014-02-15 | Disposition: A | Discharge status: Home / Self Care | Payer: Medicaid Other | Source: Ambulatory Visit | Attending: Physician Assistant | Admitting: Physician Assistant

## 2014-02-15 DIAGNOSIS — R918 Other nonspecific abnormal finding of lung field: Secondary | ICD-10-CM | POA: Diagnosis present

## 2014-02-15 DIAGNOSIS — D3500 Benign neoplasm of unspecified adrenal gland: Secondary | ICD-10-CM | POA: Diagnosis not present

## 2014-02-15 DIAGNOSIS — F17200 Nicotine dependence, unspecified, uncomplicated: Secondary | ICD-10-CM | POA: Diagnosis not present

## 2014-02-23 ENCOUNTER — Emergency Department (HOSPITAL_COMMUNITY)
Admission: EM | Admit: 2014-02-23 | Discharge: 2014-02-23 | Disposition: A | Discharge status: Home / Self Care | Payer: Medicaid Other | Attending: Emergency Medicine | Admitting: Emergency Medicine

## 2014-02-23 ENCOUNTER — Encounter (HOSPITAL_COMMUNITY): Payer: Self-pay | Admitting: Emergency Medicine

## 2014-02-23 DIAGNOSIS — F3289 Other specified depressive episodes: Secondary | ICD-10-CM | POA: Insufficient documentation

## 2014-02-23 DIAGNOSIS — Z791 Long term (current) use of non-steroidal anti-inflammatories (NSAID): Secondary | ICD-10-CM | POA: Insufficient documentation

## 2014-02-23 DIAGNOSIS — Z3201 Encounter for pregnancy test, result positive: Secondary | ICD-10-CM | POA: Insufficient documentation

## 2014-02-23 DIAGNOSIS — R21 Rash and other nonspecific skin eruption: Secondary | ICD-10-CM

## 2014-02-23 DIAGNOSIS — Z79899 Other long term (current) drug therapy: Secondary | ICD-10-CM | POA: Insufficient documentation

## 2014-02-23 DIAGNOSIS — F329 Major depressive disorder, single episode, unspecified: Secondary | ICD-10-CM | POA: Insufficient documentation

## 2014-02-23 DIAGNOSIS — J45909 Unspecified asthma, uncomplicated: Secondary | ICD-10-CM | POA: Insufficient documentation

## 2014-02-23 DIAGNOSIS — F17200 Nicotine dependence, unspecified, uncomplicated: Secondary | ICD-10-CM | POA: Insufficient documentation

## 2014-02-23 DIAGNOSIS — E119 Type 2 diabetes mellitus without complications: Secondary | ICD-10-CM | POA: Insufficient documentation

## 2014-02-23 DIAGNOSIS — Z794 Long term (current) use of insulin: Secondary | ICD-10-CM | POA: Insufficient documentation

## 2014-02-23 LAB — POC URINE PREG, ED: Preg Test, Ur: POSITIVE — AB

## 2014-02-23 MED ORDER — PERMETHRIN 5 % EX CREA: TOPICAL_CREAM | CUTANEOUS | Status: DC

## 2014-02-23 NOTE — ED Provider Notes (Signed)
CSN: [id number]     Arrival date & time 02/23/14  1524 History   First MD Initiated Contact with Patient 02/23/14 1534     Chief Complaint  Patient presents with  . Rash     (Consider location/radiation/quality/duration/timing/severity/associated sxs/prior Treatment) Patient is a 38 y.o. female presenting with rash. The history is provided by the patient.  Rash Location:  Torso and finger (between fingers) Torso rash location:  L flank, R flank, abd LLQ and abd RLQ Quality: dryness and redness   Severity:  Moderate Onset quality:  Gradual Duration:  1 month Timing:  Constant Progression:  Worsening Chronicity:  New  Shari Collins is a 38 y.o. female who presents to the ED with a rash that started a month ago. She complains of itching between her finger and on both sides of her lower abdomen that goes to the lower back. She has done nothing for the rash. She was treated by her doctor with cortisone cream but it did not help.   Past Medical History  Diagnosis Date  . Diabetes mellitus   . Asthma   . Depression    Past Surgical History  Procedure Laterality Date  . Cesarean section    . Tonsillectomy     No family history on file. History  Substance Use Topics  . Smoking status: Current Every Day Smoker  . Smokeless tobacco: Not on file  . Alcohol Use: No   OB History   Grav Para Term Preterm Abortions TAB SAB Ect Mult Living                 Review of Systems  Skin: Positive for rash.  all other systems negative    Allergies  Bee venom and Sulfa antibiotics  Home Medications   Prior to Admission medications   Medication Sig Start Date End Date Taking? Authorizing Provider  albuterol (PROVENTIL HFA;VENTOLIN HFA) 108 (90 BASE) MCG/ACT inhaler Inhale 2 puffs into the lungs every 6 (six) hours as needed for wheezing or shortness of breath.    Historical Provider, MD  citalopram (CELEXA) 20 MG tablet Take 20 mg by mouth at bedtime.    Historical Provider, MD   ibuprofen (ADVIL,MOTRIN) 200 MG tablet Take 800 mg by mouth daily as needed for pain.    Historical Provider, MD  Insulin Glargine (LANTUS SOLOSTAR) 100 UNIT/ML SOPN Inject 32 Units into the skin at bedtime.     Historical Provider, MD  lisinopril (PRINIVIL,ZESTRIL) 10 MG tablet Take 10 mg by mouth every morning.    Historical Provider, MD  Multiple Vitamin (MULTIVITAMIN WITH MINERALS) TABS tablet Take 1 tablet by mouth daily.    Historical Provider, MD  rosuvastatin (CRESTOR) 20 MG tablet Take 20 mg by mouth at bedtime.    Historical Provider, MD  sitaGLIPtin-metformin (JANUMET) 50-1000 MG per tablet Take 1 tablet by mouth 2 (two) times daily with a meal.    Historical Provider, MD  traZODone (DESYREL) 50 MG tablet Take 50 mg by mouth at bedtime.    Historical Provider, MD   BP 119/74  Pulse 78  Temp(Src) 97.6 F (36.4 C) (Oral)  Resp 18  SpO2 98%  LMP 01/29/2014 Physical Exam  Nursing note and vitals reviewed. Constitutional: She is oriented to person, place, and time. She appears well-developed and well-nourished.  HENT:  Head: Normocephalic.  Eyes: Conjunctivae and EOM are normal.  Neck: Neck supple.  Cardiovascular: Normal rate.   Pulmonary/Chest: Effort normal.  Musculoskeletal: Normal range of motion.  Neurological:  She is alert and oriented to person, place, and time. No cranial nerve deficit.  Skin: Skin is warm and dry. Rash noted.  Pruritic papules and vesicles noted to lower abdomen, fingers and right nipple.   Psychiatric: She has a normal mood and affect. Her behavior is normal.   Results for orders placed during the hospital encounter of 02/23/14 (from the past 24 hour(s))  POC URINE PREG, ED     Status: Abnormal   Collection Time    02/23/14  4:09 PM      Result Value Ref Range   Preg Test, Ur POSITIVE (*) NEGATIVE    ED Course  Procedures  Dr. Roderic Palau in to examine the patient.  MDM  38 y.o. female with pruritic rash x 1 month. Will treat for scabies and she  will follow up with the dermatologist. She will also follow up with her OB since her pregnancy test today is positive. Discussed with the patient and all questioned fully answered.    Medication List    TAKE these medications       permethrin 5 % cream  Commonly known as:  ELIMITE  Apply to affected area once      ASK your doctor about these medications       albuterol 108 (90 BASE) MCG/ACT inhaler  Commonly known as:  PROVENTIL HFA;VENTOLIN HFA  Inhale 2 puffs into the lungs every 6 (six) hours as needed for wheezing or shortness of breath.     citalopram 20 MG tablet  Commonly known as:  CELEXA  Take 20 mg by mouth at bedtime.     ibuprofen 200 MG tablet  Commonly known as:  ADVIL,MOTRIN  Take 800 mg by mouth daily as needed for pain.     LANTUS SOLOSTAR 100 UNIT/ML Solostar Pen  Generic drug:  Insulin Glargine  Inject 32 Units into the skin at bedtime.     lisinopril 10 MG tablet  Commonly known as:  PRINIVIL,ZESTRIL  Take 10 mg by mouth every morning.     multivitamin with minerals Tabs tablet  Take 1 tablet by mouth daily.     rosuvastatin 20 MG tablet  Commonly known as:  CRESTOR  Take 20 mg by mouth at bedtime.     sitaGLIPtin-metformin 50-1000 MG per tablet  Commonly known as:  JANUMET  Take 1 tablet by mouth 2 (two) times daily with a meal.     traZODone 50 MG tablet  Commonly known as:  DESYREL  Take 50 mg by mouth at bedtime.            [place], [place] 02/23/14 1623

## 2014-02-23 NOTE — ED Notes (Signed)
Pt reports woke up with rash this morning.  C/O itching.  Rash is fine, red, raised bumps on trunk and chest.  Pt also reports has a knot on her R nipple x 4 days and is tender to touch.

## 2014-02-23 NOTE — Discharge Instructions (Signed)
The rash you have may be scabies. We are treating you with the cream that will treat scabies. If the rash continues call Dr. Juel Burrow office for follow up.

## 2014-02-23 NOTE — ED Notes (Signed)
Dr Roderic Palau in to see pt

## 2014-02-23 NOTE — ED Notes (Signed)
Pts  POC preg was  Positive, PT notified,  And advised about f/u.  Alert, NAD

## 2014-02-23 NOTE — ED Provider Notes (Signed)
Medical screening examination/treatment/procedure(s) were performed by non-physician practitioner and as supervising physician I was immediately available for consultation/collaboration.   EKG Interpretation None        Maudry Diego, MD 02/23/14 2133

## 2014-03-08 ENCOUNTER — Other Ambulatory Visit: Payer: Self-pay

## 2014-03-15 ENCOUNTER — Encounter (HOSPITAL_COMMUNITY): Payer: Self-pay | Admitting: Nurse Practitioner

## 2014-03-20 ENCOUNTER — Encounter (HOSPITAL_COMMUNITY): Payer: Medicaid Other

## 2014-03-23 ENCOUNTER — Encounter (HOSPITAL_COMMUNITY): Payer: Medicaid Other

## 2014-03-27 ENCOUNTER — Encounter (HOSPITAL_COMMUNITY): Payer: Self-pay

## 2014-03-27 ENCOUNTER — Encounter (HOSPITAL_COMMUNITY): Payer: Medicaid Other

## 2014-03-27 ENCOUNTER — Ambulatory Visit (HOSPITAL_COMMUNITY)
Admission: RE | Admit: 2014-03-27 | Discharge: 2014-03-27 | Disposition: A | Discharge status: Home / Self Care | Payer: Medicaid Other | Source: Ambulatory Visit | Attending: Nurse Practitioner | Admitting: Nurse Practitioner

## 2014-03-27 ENCOUNTER — Other Ambulatory Visit: Payer: Self-pay

## 2014-03-27 DIAGNOSIS — O09529 Supervision of elderly multigravida, unspecified trimester: Secondary | ICD-10-CM | POA: Insufficient documentation

## 2014-03-27 DIAGNOSIS — E119 Type 2 diabetes mellitus without complications: Secondary | ICD-10-CM | POA: Diagnosis not present

## 2014-03-27 DIAGNOSIS — O24919 Unspecified diabetes mellitus in pregnancy, unspecified trimester: Secondary | ICD-10-CM | POA: Insufficient documentation

## 2014-03-27 DIAGNOSIS — O10019 Pre-existing essential hypertension complicating pregnancy, unspecified trimester: Secondary | ICD-10-CM | POA: Diagnosis not present

## 2014-03-27 DIAGNOSIS — J45909 Unspecified asthma, uncomplicated: Secondary | ICD-10-CM | POA: Diagnosis not present

## 2014-03-27 DIAGNOSIS — IMO0002 Reserved for concepts with insufficient information to code with codable children: Secondary | ICD-10-CM | POA: Diagnosis not present

## 2014-03-27 DIAGNOSIS — O99330 Smoking (tobacco) complicating pregnancy, unspecified trimester: Secondary | ICD-10-CM | POA: Insufficient documentation

## 2014-03-27 DIAGNOSIS — O99340 Other mental disorders complicating pregnancy, unspecified trimester: Secondary | ICD-10-CM | POA: Insufficient documentation

## 2014-03-27 DIAGNOSIS — F341 Dysthymic disorder: Secondary | ICD-10-CM | POA: Insufficient documentation

## 2014-03-27 NOTE — Progress Notes (Signed)
Genetic Counseling  High-Risk Gestation Note  Appointment Date:  03/27/2014 Referred By: Shari Fantasia, NP Date of Birth:  1976-03-29   Pregnancy History: G1P0 Estimated Date of Delivery: 11/04/14 Estimated Gestational Age: [redacted]w[redacted]d Attending: Jolyn Lent, MD  [name] was seen for genetic counseling because of a maternal age of 38 y.o..  She was accompanied by her friend to today's visit.   They were counseled regarding maternal age and the association with risk for chromosome conditions due to nondisjunction with aging of the ova.   We reviewed chromosomes, nondisjunction, and the associated 1 in 67 risk for fetal aneuploidy related to a maternal age of 38 y.o. at [redacted]w[redacted]d gestation.  They were counseled that the risk for aneuploidy decreases as gestational age increases, accounting for those pregnancies which spontaneously abort.  We specifically discussed Down syndrome (trisomy 54), trisomies 43 and 76, and sex chromosome aneuploidies (47,XXX and 47,XXY) including the common features and prognoses of each.   We reviewed available screening options including First Screen, Quad screen, noninvasive prenatal screening (NIPS)/cell free DNA (cfDNA) testing, and detailed ultrasound.  They were counseled that screening tests are used to modify a patient's a priori risk for aneuploidy, typically based on age. This estimate provides a pregnancy specific risk assessment. We reviewed the benefits and limitations of each option. Specifically, we discussed the conditions for which each test screens, the detection rates, and false positive rates of each. They were also counseled regarding diagnostic testing via CVS and amniocentesis. We reviewed the approximate 1 in 175 risk for complications for CVS and the approximate 1 in 102-585 risk for complications for amniocentesis, including spontaneous pregnancy loss. After consideration of all the options, she expressed interest in pursuing first trimester  screening. We discussed that this is performed between 11 and [redacted] weeks gestation. Please contact our office if you would like this appointment scheduled with the [facility].  [name] declined CVS and amniocentesis in the pregnancy. They understand/s that screening tests cannot rule out all birth defects or genetic syndromes.   [name] was provided with written information regarding cystic fibrosis (CF) including the carrier frequency and incidence in the Caucasian and African American populations, the availability of carrier testing and prenatal diagnosis if indicated.  In addition, we discussed that CF is routinely screened for as part of the [place] newborn screening panel.  She declined testing today.   [name] family history was reviewed and found to be noncontributory for birth defects, intellectual disability, and known genetic conditions. [name] was not familiar with the father of the baby's family history.  We, therefore, cannot comment on how his history might contribute to the overall chance for the baby to have a birth defect. Without further information regarding the provided family history, an accurate genetic risk cannot be calculated. Further genetic counseling is warranted if more information is obtained.  [name] denied exposure to environmental toxins or chemical agents. She denied the use of alcohol or street drugs. She reported smoking approximately a half pack of cigarettes per day. The associations of smoking in pregnancy were reviewed and cessation encouraged. She denied significant viral illnesses during the course of her pregnancy. Her medical and surgical histories were contributory for diabetes mellitus. She also reported a history of mental health conditions and stated that she is not currently taking medication for treatment. [name] was seen for MFM consultation today regarding her medical history. See separate consult  note  from Dr. Nelda Collins for detailed discussion.   I counseled [name] regarding the above risks and available options.  The approximate face-to-face time with the genetic counselor was 35 minutes.  Shari Oman, MS,  Certified Genetic Counselor 03/27/2014

## 2014-03-27 NOTE — Progress Notes (Signed)
MATERNAL FETAL MEDICINE CONSULT  Patient Name: [place] Record Number:  [id number] Date of Birth: [birth year] Requesting Physician Name:  Tylene Fantasia, NP Date of Service: 03/27/2014  Chief Complaint Multiple medical issues  History of Present Illness Abena L Raschke was seen today for prenatal diagnosis secondary to multiple medical issues at the request of Tylene Fantasia, NP.  The patient is a 38 y.o. G1P0,at [redacted]w[redacted]d with an EDD of 11/04/2014, by Last Menstrual Period dating method.  Ms. Navarrete has a history of type II DM since 2011.  She is currently receiving Novolog 10 units [place] with meals, Lantus 40 mg [place] qhs, and Janumet.  Review of Ms. Russett's glucose log since starting the current regimen about 1 week ago show all of her fastings to be greater than 90, but only a few of her 1 hour post-prandials are greater than 140.  She was first diagnosed with hypertension 14 years ago.  She reports good blood pressure control with lisinopril prior to pregnancy.  Since learning she was pregnant she has stopped this medication and not started an additional agent.  She also has an extensive psychiatric history and reports having depression, anxiety, bipolar disorder, and PTSD.  She was taking clonazepam, citalopram, and haloperidol prior to pregnancy.  Her mental health provider took her off all of these medications when she became pregnant.  She reports good control of her mood and anxiety at this time without medication.  Review of Systems Pertinent items are noted in HPI.  Patient History OB History  Gravida Para Term Preterm AB SAB TAB Ectopic Multiple Living  1             # Outcome Date GA Lbr Len/2nd Weight Sex Delivery Anes PTL Lv  1 CUR               Past Medical History  Diagnosis Date  . Diabetes mellitus   . Asthma   . Depression     Past Surgical History  Procedure Laterality Date  . Cesarean section    . Tonsillectomy      History   Social History  .  Marital Status: Single    Spouse Name: N/A    Number of Children: N/A  . Years of Education: N/A   Social History Main Topics  . Smoking status: Current Every Day Smoker  . Smokeless tobacco: None  . Alcohol Use: No  . Drug Use: No  . Sexual Activity: Yes    Birth Control/ Protection: None   Other Topics Concern  . None   Social History Narrative  . None    History reviewed. No pertinent family history. In addition, the patient has no family history of mental retardation, birth defects, or genetic diseases.  Physical Examination Filed Vitals:   03/27/14 1404  BP: 112/75  Pulse: 84   General appearance - alert, well appearing, and in no distress  Assessment and Recommendations 1.  Diabetes mellitus.  Ms. Cotham's hemoglobin A1c was 8.2% indicating a slightly increased risk of congenital anomalies.  Review of her glucose log clearly demonstrates fasting hyperglycemia.  However, her post-prandial glycemic control is difficult to assess as she is checking 1 hour after meals rather than 2 hours afterward which is typically done during pregnancy.  However, as nearly all of her 1 hour values are less than 140, I suspect she has only mild post-prandial hyperglycemia.  I have instructed her to increase Lantus to 44 units [place] qhs to  improve her fasting hyperglycemia.  I advised her to begin checking 2 hours, rather than 1 hour, after meals.  She will fax her glucose log to use in one week and the need to increase her mealtime Novolog will be re-assessed.  Ms. Ferrucci should meet with a dietician and/or diabetes educator to discuss dietary modification, blood glucose testing, and goals of diet therapy.  The goals of therapy include fasting blood sugars less than 90 mg/dl and 2-hour postprandial less than 120 mg/dl with avoidance of hypoglycemia.   Ms. Zweig will require serial growth ultrasounds every 4 weeks after her anatomy scan at 18 weeks and twice weekly antepartum testing beginning at  32 weeks. 2.  Chronic hypertension.  Ms. Martire reports her BP has been normal or only mildly elevated since stopping her lisinopril.  Given that I do not recommend starting an alternative agent at this time.  However, it is very likely that she will require an anti-hypertensive agent later in pregnancy.  I recommend oral labetalol or nifedipine XL as they are very effective at lowering blood pressure than methyldopa and safe in pregnancy.  Recommended starting doses are labetalol 200 mg po bid or nifedipine XL 30 mg daily. Both of those medications can be titrated upwards to a maximum daily dose of 2400 mg of labetalol or 120 mg of nifedipine XL.  A CBC, AST, ALT, 24 hour urine collection, and serum creatinine should be performed if not already collected to determine her baseline creatinine clearance and proteinuria. This should be repeated each trimester and as clinically indicated based on disease symptoms or the appearance of hypertension. As chronic hypertension is associated with fetal growth restriction the patient should have serial growth scans every 4 weeks after her anatomic survey at approximately 18 weeks. Once or twice weekly fetal surveillance should be started at 32 weeks of gestation. 3.  Psychiatric issues.  Ms. Chaffin reports several psychiatric diagnoses.  With the abrupt discontinuation of her medications she is at high risk of relapse.  At the very least I recommended Ms. Pilot restart citalopram; however, she declined to do so.  Ms. Salamon should follow up with her mental health provider immediately to reassess the need to restart these medications or find alternatives safe to use in pregnancy.  While I would defer the management of her psychiatric issues to her mental health provider I would be happy to discuss the safety of various medications in pregnancy to assist with establishing an effective regimen that is also safe in pregnancy.    I spent 30 minutes with Ms. Wnek today  of which 50% was face-to-face counseling.  Thank you for referring Ms. Passero to the [facility].  Please do not hesitate to contact us with questions.   Jolyn Lent, MD

## 2014-04-02 ENCOUNTER — Telehealth (HOSPITAL_COMMUNITY): Payer: Self-pay | Admitting: *Deleted

## 2014-04-02 NOTE — Telephone Encounter (Signed)
Shari Collins faxed in her blood sugars to the office.  Reviewed with Dr. Leonides Sake.  See scanned documents for blood sugar log.  Left message for patient to make changes as follows: Increase novolog from 10 units to 14 units with each meal, Increase Lantus from 44 units at bedtime to 50 units.  Left number to call back with any questions or concerns.

## 2014-04-04 ENCOUNTER — Other Ambulatory Visit: Payer: Self-pay

## 2014-05-25 ENCOUNTER — Other Ambulatory Visit (HOSPITAL_COMMUNITY): Payer: Self-pay | Admitting: Unknown Physician Specialty

## 2014-05-25 DIAGNOSIS — O09522 Supervision of elderly multigravida, second trimester: Secondary | ICD-10-CM

## 2014-05-25 DIAGNOSIS — Z3689 Encounter for other specified antenatal screening: Secondary | ICD-10-CM

## 2014-05-25 DIAGNOSIS — R772 Abnormality of alphafetoprotein: Secondary | ICD-10-CM

## 2014-05-25 DIAGNOSIS — O24912 Unspecified diabetes mellitus in pregnancy, second trimester: Secondary | ICD-10-CM

## 2014-06-01 ENCOUNTER — Ambulatory Visit (HOSPITAL_COMMUNITY)
Admission: RE | Admit: 2014-06-01 | Discharge: 2014-06-01 | Disposition: A | Discharge status: Home / Self Care | Payer: Medicaid Other | Source: Ambulatory Visit | Attending: Nurse Practitioner | Admitting: Nurse Practitioner

## 2014-06-01 ENCOUNTER — Encounter (HOSPITAL_COMMUNITY): Payer: Self-pay

## 2014-06-01 ENCOUNTER — Ambulatory Visit (HOSPITAL_COMMUNITY): Admission: RE | Admit: 2014-06-01 | Payer: Medicaid Other | Source: Ambulatory Visit

## 2014-06-01 DIAGNOSIS — O09522 Supervision of elderly multigravida, second trimester: Secondary | ICD-10-CM

## 2014-06-01 DIAGNOSIS — O09529 Supervision of elderly multigravida, unspecified trimester: Secondary | ICD-10-CM | POA: Diagnosis not present

## 2014-06-01 DIAGNOSIS — Z3689 Encounter for other specified antenatal screening: Secondary | ICD-10-CM

## 2014-06-01 DIAGNOSIS — R772 Abnormality of alphafetoprotein: Secondary | ICD-10-CM

## 2014-06-01 DIAGNOSIS — O24919 Unspecified diabetes mellitus in pregnancy, unspecified trimester: Secondary | ICD-10-CM | POA: Diagnosis present

## 2014-06-01 DIAGNOSIS — E119 Type 2 diabetes mellitus without complications: Secondary | ICD-10-CM | POA: Diagnosis not present

## 2014-06-01 DIAGNOSIS — O24912 Unspecified diabetes mellitus in pregnancy, second trimester: Secondary | ICD-10-CM

## 2014-06-27 ENCOUNTER — Other Ambulatory Visit (HOSPITAL_COMMUNITY): Payer: Self-pay | Admitting: Unknown Physician Specialty

## 2014-06-27 DIAGNOSIS — O10912 Unspecified pre-existing hypertension complicating pregnancy, second trimester: Secondary | ICD-10-CM

## 2014-06-27 DIAGNOSIS — O24912 Unspecified diabetes mellitus in pregnancy, second trimester: Secondary | ICD-10-CM

## 2014-06-27 DIAGNOSIS — O09522 Supervision of elderly multigravida, second trimester: Secondary | ICD-10-CM

## 2014-06-27 DIAGNOSIS — Z3A22 22 weeks gestation of pregnancy: Secondary | ICD-10-CM

## 2014-07-06 ENCOUNTER — Ambulatory Visit (HOSPITAL_COMMUNITY)
Admission: RE | Admit: 2014-07-06 | Discharge: 2014-07-06 | Disposition: A | Discharge status: Home / Self Care | Payer: Medicaid Other | Source: Ambulatory Visit | Attending: Unknown Physician Specialty | Admitting: Unknown Physician Specialty

## 2014-07-06 ENCOUNTER — Other Ambulatory Visit (HOSPITAL_COMMUNITY): Payer: Self-pay | Admitting: Unknown Physician Specialty

## 2014-07-06 VITALS — BP 120/64 | HR 85 | Wt 234.8 lb

## 2014-07-06 DIAGNOSIS — O09522 Supervision of elderly multigravida, second trimester: Secondary | ICD-10-CM | POA: Diagnosis not present

## 2014-07-06 DIAGNOSIS — E119 Type 2 diabetes mellitus without complications: Secondary | ICD-10-CM | POA: Insufficient documentation

## 2014-07-06 DIAGNOSIS — Z3A22 22 weeks gestation of pregnancy: Secondary | ICD-10-CM | POA: Insufficient documentation

## 2014-07-06 DIAGNOSIS — O24912 Unspecified diabetes mellitus in pregnancy, second trimester: Secondary | ICD-10-CM

## 2014-07-06 DIAGNOSIS — O10012 Pre-existing essential hypertension complicating pregnancy, second trimester: Secondary | ICD-10-CM | POA: Diagnosis not present

## 2014-07-06 DIAGNOSIS — O10912 Unspecified pre-existing hypertension complicating pregnancy, second trimester: Secondary | ICD-10-CM

## 2014-07-06 DIAGNOSIS — O24112 Pre-existing diabetes mellitus, type 2, in pregnancy, second trimester: Secondary | ICD-10-CM | POA: Diagnosis not present

## 2014-07-09 ENCOUNTER — Encounter (HOSPITAL_COMMUNITY): Payer: Self-pay

## 2014-07-17 ENCOUNTER — Other Ambulatory Visit (HOSPITAL_COMMUNITY): Payer: Self-pay

## 2014-08-04 ENCOUNTER — Encounter (HOSPITAL_COMMUNITY): Payer: Self-pay | Admitting: *Deleted

## 2014-08-04 ENCOUNTER — Emergency Department (HOSPITAL_COMMUNITY)
Admission: EM | Admit: 2014-08-04 | Discharge: 2014-08-04 | Disposition: A | Discharge status: Home / Self Care | Payer: Medicaid Other | Attending: Emergency Medicine | Admitting: Emergency Medicine

## 2014-08-04 DIAGNOSIS — Z3490 Encounter for supervision of normal pregnancy, unspecified, unspecified trimester: Secondary | ICD-10-CM

## 2014-08-04 DIAGNOSIS — F17210 Nicotine dependence, cigarettes, uncomplicated: Secondary | ICD-10-CM | POA: Insufficient documentation

## 2014-08-04 DIAGNOSIS — Z79899 Other long term (current) drug therapy: Secondary | ICD-10-CM | POA: Insufficient documentation

## 2014-08-04 DIAGNOSIS — Z794 Long term (current) use of insulin: Secondary | ICD-10-CM | POA: Insufficient documentation

## 2014-08-04 DIAGNOSIS — J45901 Unspecified asthma with (acute) exacerbation: Secondary | ICD-10-CM | POA: Insufficient documentation

## 2014-08-04 DIAGNOSIS — O99513 Diseases of the respiratory system complicating pregnancy, third trimester: Secondary | ICD-10-CM | POA: Diagnosis present

## 2014-08-04 DIAGNOSIS — O24113 Pre-existing diabetes mellitus, type 2, in pregnancy, third trimester: Secondary | ICD-10-CM | POA: Diagnosis not present

## 2014-08-04 DIAGNOSIS — O99333 Smoking (tobacco) complicating pregnancy, third trimester: Secondary | ICD-10-CM | POA: Insufficient documentation

## 2014-08-04 DIAGNOSIS — O99343 Other mental disorders complicating pregnancy, third trimester: Secondary | ICD-10-CM | POA: Insufficient documentation

## 2014-08-04 DIAGNOSIS — Z3A28 28 weeks gestation of pregnancy: Secondary | ICD-10-CM | POA: Diagnosis not present

## 2014-08-04 DIAGNOSIS — F329 Major depressive disorder, single episode, unspecified: Secondary | ICD-10-CM | POA: Insufficient documentation

## 2014-08-04 MED ORDER — PREDNISONE 20 MG PO TABS
40.0000 mg | ORAL_TABLET | Freq: Once | ORAL | Status: AC
  Administered 2014-08-04: 40 mg via ORAL
  Filled 2014-08-04: qty 2

## 2014-08-04 MED ORDER — PREDNISONE 20 MG PO TABS: 20.0000 mg | ORAL_TABLET | Freq: Every day | ORAL | Status: DC

## 2014-08-04 MED ORDER — ALBUTEROL SULFATE HFA 108 (90 BASE) MCG/ACT IN AERS
2.0000 | INHALATION_SPRAY | Freq: Once | RESPIRATORY_TRACT | Status: AC
  Administered 2014-08-04: 2 via RESPIRATORY_TRACT
  Filled 2014-08-04: qty 6.7

## 2014-08-04 MED ORDER — OXYMETAZOLINE HCL 0.05 % NA SOLN
2.0000 | Freq: Once | NASAL | Status: AC
  Administered 2014-08-04: 2 via NASAL
  Filled 2014-08-04: qty 15

## 2014-08-04 NOTE — Discharge Instructions (Signed)
Your examination is consistent with an exacerbation of your asthma. Please use albuterol 2 puffs every 4 hours. Please use prednisone daily with a meal. Please monitor your glucose carefully, as prednisone can have an effect on your glucose levels. Please discuss your asthma management with your GYN specialist. Asthma Asthma is a condition of the lungs in which the airways tighten and narrow. Asthma can make it hard to breathe. Asthma cannot be cured, but medicine and lifestyle changes can help control it. Asthma may be started (triggered) by:  Animal skin flakes (dander).  Dust.  Cockroaches.  Pollen.  Mold.  Smoke.  Cleaning products.  Hair sprays or aerosol sprays.  Paint fumes or strong smells.  Cold air, weather changes, and winds.  Crying or laughing hard.  Stress.  Certain medicines or drugs.  Foods, such as dried fruit, potato chips, and sparkling grape juice.  Infections or conditions (colds, flu).  Exercise.  Certain medical conditions or diseases.  Exercise or tiring activities. HOME CARE   Take medicine as told by your doctor.  Use a peak flow meter as told by your doctor. A peak flow meter is a tool that measures how well the lungs are working.  Record and keep track of the peak flow meter's readings.  Understand and use the asthma action plan. An asthma action plan is a written plan for taking care of your asthma and treating your attacks.  To help prevent asthma attacks:  Do not smoke. Stay away from secondhand smoke.  Change your heating and air conditioning filter often.  Limit your use of fireplaces and wood stoves.  Get rid of pests (such as roaches and mice) and their droppings.  Throw away plants if you see mold on them.  Clean your floors. Dust regularly. Use cleaning products that do not smell.  Have someone vacuum when you are not home. Use a vacuum cleaner with a HEPA filter if possible.  Replace carpet with wood, tile, or  vinyl flooring. Carpet can trap animal skin flakes and dust.  Use allergy-proof pillows, mattress covers, and box spring covers.  Wash bed sheets and blankets every week in hot water and dry them in a dryer.  Use blankets that are made of polyester or cotton.  Clean bathrooms and kitchens with bleach. If possible, have someone repaint the walls in these rooms with mold-resistant paint. Keep out of the rooms that are being cleaned and painted.  Wash hands often. GET HELP IF:  You have make a whistling sound when breaking (wheeze), have shortness of breath, or have a cough even if taking medicine to prevent attacks.  The colored mucus you cough up (sputum) is thicker than usual.  The colored mucus you cough up changes from clear or white to yellow, green, gray, or bloody.  You have problems from the medicine you are taking such as:  A rash.  Itching.  Swelling.  Trouble breathing.  You need reliever medicines more than 2-3 times a week.  Your peak flow measurement is still at 50-79% of your personal best after following the action plan for 1 hour.  You have a fever. GET HELP RIGHT AWAY IF:   You seem to be worse and are not responding to medicine during an asthma attack.  You are short of breath even at rest.  You get short of breath when doing very little activity.  You have trouble eating, drinking, or talking.  You have chest pain.  You have a fast heartbeat.  Your lips or fingernails start to turn blue.  You are light-headed, dizzy, or faint.  Your peak flow is less than 50% of your personal best. MAKE SURE YOU:   Understand these instructions.  Will watch your condition.  Will get help right away if you are not doing well or get worse. Document Released: 02/10/2008 Document Revised: 01/08/2014 Document Reviewed: 03/23/2013 [facility] Patient Information 2015 [place], [place]. This information is not intended to replace advice given to you by your health  care provider. Make sure you discuss any questions you have with your health care provider.

## 2014-08-04 NOTE — ED Notes (Signed)
Patient with no complaints at this time. Respirations even and unlabored. Skin warm/dry. Discharge instructions reviewed with patient at this time. Patient given opportunity to voice concerns/ask questions. Patient discharged at this time and left Emergency Department with steady gait.   

## 2014-08-04 NOTE — ED Provider Notes (Signed)
CSN: [id number]     Arrival date & time 08/04/14  1913 History   First MD Initiated Contact with Patient 08/04/14 1957     Chief Complaint  Patient presents with  . URI     (Consider location/radiation/quality/duration/timing/severity/associated sxs/prior Treatment) Patient is a 38 y.o. female presenting with URI. The history is provided by the patient.  URI Presenting symptoms: congestion and cough   Severity:  Moderate Onset quality:  Gradual Duration:  2 days Timing:  Intermittent Progression:  Worsening Chronicity:  New Relieved by:  Nothing Ineffective treatments:  None tried Associated symptoms: myalgias   Risk factors: diabetes mellitus and sick contacts   Risk factors: no immunosuppression and no recent travel     Past Medical History  Diagnosis Date  . Diabetes mellitus   . Asthma   . Depression    Past Surgical History  Procedure Laterality Date  . Cesarean section    . Tonsillectomy     History reviewed. No pertinent family history. History  Substance Use Topics  . Smoking status: Current Every Day Smoker  . Smokeless tobacco: Not on file  . Alcohol Use: No   OB History    Gravida Para Term Preterm AB TAB SAB Ectopic Multiple Living   4 1 1  2  2   1      Review of Systems  Constitutional: Positive for chills.  HENT: Positive for congestion.   Respiratory: Positive for cough.   Musculoskeletal: Positive for myalgias.  Psychiatric/Behavioral:       Depression      Allergies  Bee venom and Sulfa antibiotics  Home Medications   Prior to Admission medications   Medication Sig Start Date End Date Taking? Authorizing Provider  albuterol (PROVENTIL HFA;VENTOLIN HFA) 108 (90 BASE) MCG/ACT inhaler Inhale 2 puffs into the lungs every 6 (six) hours as needed for wheezing or shortness of breath.    Historical Provider, MD  citalopram (CELEXA) 20 MG tablet Take 20 mg by mouth at bedtime.    Historical Provider, MD  ibuprofen (ADVIL,MOTRIN) 200 MG  tablet Take 800 mg by mouth daily as needed for pain.    Historical Provider, MD  Insulin Glargine (LANTUS SOLOSTAR) 100 UNIT/ML SOPN Inject 32 Units into the skin at bedtime.     Historical Provider, MD  lisinopril (PRINIVIL,ZESTRIL) 10 MG tablet Take 10 mg by mouth every morning.    Historical Provider, MD  Multiple Vitamin (MULTIVITAMIN WITH MINERALS) TABS tablet Take 1 tablet by mouth daily.    Historical Provider, MD  permethrin (ELIMITE) 5 % cream Apply to affected area once 02/23/14   Ashley Murrain, NP  rosuvastatin (CRESTOR) 20 MG tablet Take 20 mg by mouth at bedtime.    Historical Provider, MD  sitaGLIPtin-metformin (JANUMET) 50-1000 MG per tablet Take 1 tablet by mouth 2 (two) times daily with a meal.    Historical Provider, MD  traZODone (DESYREL) 50 MG tablet Take 50 mg by mouth at bedtime.    Historical Provider, MD   BP 138/73 mmHg  Pulse 99  Temp(Src) 99 F (37.2 C) (Oral)  Resp 19  Ht 5\' 6"  (1.676 m)  Wt 242 lb (109.77 kg)  BMI 39.08 kg/m2  SpO2 97%  LMP 01/28/2014 Physical Exam  Constitutional: She is oriented to person, place, and time. She appears well-developed and well-nourished.  Non-toxic appearance.  HENT:  Head: Normocephalic.  Right Ear: Tympanic membrane and external ear normal.  Left Ear: Tympanic membrane and external ear normal.  Eyes:  EOM and lids are normal. Pupils are equal, round, and reactive to light.  Neck: Normal range of motion. Neck supple. Carotid bruit is not present.  Cardiovascular: Normal rate, regular rhythm, normal heart sounds, intact distal pulses and normal pulses.   Pulmonary/Chest: No respiratory distress. She has wheezes.  Pt speaks in complete sentences without problem.  Abdominal: Soft. Bowel sounds are normal. There is no guarding.  Fetal heart tones 158  Musculoskeletal: Normal range of motion.  Lymphadenopathy:       Head (right side): No submandibular adenopathy present.       Head (left side): No submandibular adenopathy  present.    She has no cervical adenopathy.  Neurological: She is alert and oriented to person, place, and time. She has normal strength. No cranial nerve deficit or sensory deficit.  Skin: Skin is warm and dry.  Psychiatric: She has a normal mood and affect. Her speech is normal.  Nursing note and vitals reviewed.   ED Course  Procedures (including critical care time) Labs Review Labs Reviewed - No data to display  Imaging Review No results found.   EKG Interpretation None      MDM  Patient presents to the emergency department with complaint of chills and cough for 2 or 3 days. She has a history of asthma. She also states that she is 7 months pregnant. She's not had any high fevers to report. There's been no injury to the chest area or abdomen. She's not had any pain in her chest. And has not been any hemoptysis.  The examination is consistent with an acute exacerbation of her asthma. Her pulse oximetry is 97%, and she speaks in complete sentences.  The plan at this time is for the patient to be placed on a short course of prednisone and albuterol inhaler. The patient is to follow-up with her GYN specialist for additional evaluation and management. The patient will return to the emergency department if any emergent changes, problems, or concerns. The fetal heart tones were tested and found to be 158.    Final diagnoses:  Asthma exacerbation  Pregnancy    **I have reviewed nursing notes, vital signs, and all appropriate lab and imaging results for this patient.Lenox Ahr, PA-C 08/04/14 2144  Richarda Blade, MD 08/04/14 [phone]

## 2014-08-04 NOTE — ED Notes (Signed)
Pt c/o chills and cough x 2 days

## 2014-08-06 ENCOUNTER — Ambulatory Visit (HOSPITAL_COMMUNITY): Payer: Medicaid Other | Attending: Unknown Physician Specialty

## 2014-08-28 ENCOUNTER — Other Ambulatory Visit (HOSPITAL_COMMUNITY): Payer: Self-pay | Admitting: Unknown Physician Specialty

## 2014-08-28 ENCOUNTER — Encounter (HOSPITAL_COMMUNITY): Payer: Self-pay

## 2014-08-28 ENCOUNTER — Other Ambulatory Visit (HOSPITAL_COMMUNITY): Payer: Self-pay

## 2014-08-28 ENCOUNTER — Ambulatory Visit (HOSPITAL_COMMUNITY)
Admission: RE | Admit: 2014-08-28 | Discharge: 2014-08-28 | Disposition: A | Discharge status: Home / Self Care | Payer: Medicaid Other | Source: Ambulatory Visit | Attending: Unknown Physician Specialty | Admitting: Unknown Physician Specialty

## 2014-08-28 DIAGNOSIS — Z3A30 30 weeks gestation of pregnancy: Secondary | ICD-10-CM

## 2014-08-28 DIAGNOSIS — O09522 Supervision of elderly multigravida, second trimester: Secondary | ICD-10-CM | POA: Diagnosis not present

## 2014-08-28 DIAGNOSIS — O24912 Unspecified diabetes mellitus in pregnancy, second trimester: Secondary | ICD-10-CM | POA: Diagnosis not present

## 2014-08-28 DIAGNOSIS — O09529 Supervision of elderly multigravida, unspecified trimester: Secondary | ICD-10-CM

## 2014-08-28 DIAGNOSIS — O10912 Unspecified pre-existing hypertension complicating pregnancy, second trimester: Secondary | ICD-10-CM | POA: Insufficient documentation

## 2014-08-28 DIAGNOSIS — O99213 Obesity complicating pregnancy, third trimester: Secondary | ICD-10-CM

## 2014-08-28 DIAGNOSIS — O24319 Unspecified pre-existing diabetes mellitus in pregnancy, unspecified trimester: Secondary | ICD-10-CM

## 2014-09-14 ENCOUNTER — Encounter (HOSPITAL_COMMUNITY): Payer: Self-pay

## 2014-09-14 ENCOUNTER — Ambulatory Visit (HOSPITAL_COMMUNITY)
Admission: RE | Admit: 2014-09-14 | Discharge: 2014-09-14 | Disposition: A | Discharge status: Home / Self Care | Payer: Medicaid Other | Source: Ambulatory Visit | Attending: Unknown Physician Specialty | Admitting: Unknown Physician Specialty

## 2014-09-14 VITALS — BP 135/78 | HR 89 | Wt 240.5 lb

## 2014-09-14 DIAGNOSIS — Z3A32 32 weeks gestation of pregnancy: Secondary | ICD-10-CM

## 2014-09-14 DIAGNOSIS — O99213 Obesity complicating pregnancy, third trimester: Secondary | ICD-10-CM

## 2014-09-14 DIAGNOSIS — O09523 Supervision of elderly multigravida, third trimester: Secondary | ICD-10-CM

## 2014-09-14 DIAGNOSIS — O09522 Supervision of elderly multigravida, second trimester: Secondary | ICD-10-CM | POA: Diagnosis present

## 2014-09-14 DIAGNOSIS — O24912 Unspecified diabetes mellitus in pregnancy, second trimester: Secondary | ICD-10-CM | POA: Diagnosis not present

## 2014-09-14 DIAGNOSIS — O10912 Unspecified pre-existing hypertension complicating pregnancy, second trimester: Secondary | ICD-10-CM | POA: Diagnosis not present

## 2014-09-14 DIAGNOSIS — O24313 Unspecified pre-existing diabetes mellitus in pregnancy, third trimester: Secondary | ICD-10-CM

## 2014-09-21 ENCOUNTER — Ambulatory Visit (HOSPITAL_COMMUNITY)
Admission: RE | Admit: 2014-09-21 | Discharge: 2014-09-21 | Disposition: A | Discharge status: Home / Self Care | Payer: Medicaid Other | Source: Ambulatory Visit | Attending: Unknown Physician Specialty | Admitting: Unknown Physician Specialty

## 2014-09-21 ENCOUNTER — Encounter (HOSPITAL_COMMUNITY): Payer: Self-pay

## 2014-09-21 DIAGNOSIS — E119 Type 2 diabetes mellitus without complications: Secondary | ICD-10-CM | POA: Insufficient documentation

## 2014-09-21 DIAGNOSIS — F17210 Nicotine dependence, cigarettes, uncomplicated: Secondary | ICD-10-CM | POA: Diagnosis not present

## 2014-09-21 DIAGNOSIS — O10013 Pre-existing essential hypertension complicating pregnancy, third trimester: Secondary | ICD-10-CM | POA: Insufficient documentation

## 2014-09-21 DIAGNOSIS — O99333 Smoking (tobacco) complicating pregnancy, third trimester: Secondary | ICD-10-CM | POA: Diagnosis not present

## 2014-09-21 DIAGNOSIS — O24113 Pre-existing diabetes mellitus, type 2, in pregnancy, third trimester: Secondary | ICD-10-CM | POA: Insufficient documentation

## 2014-09-21 DIAGNOSIS — O3421 Maternal care for scar from previous cesarean delivery: Secondary | ICD-10-CM | POA: Diagnosis not present

## 2014-09-21 DIAGNOSIS — O09523 Supervision of elderly multigravida, third trimester: Secondary | ICD-10-CM | POA: Diagnosis not present

## 2014-09-21 DIAGNOSIS — O09522 Supervision of elderly multigravida, second trimester: Secondary | ICD-10-CM

## 2014-09-21 DIAGNOSIS — O99213 Obesity complicating pregnancy, third trimester: Secondary | ICD-10-CM | POA: Diagnosis not present

## 2014-09-21 DIAGNOSIS — O24912 Unspecified diabetes mellitus in pregnancy, second trimester: Secondary | ICD-10-CM

## 2014-09-21 DIAGNOSIS — Z3A33 33 weeks gestation of pregnancy: Secondary | ICD-10-CM | POA: Insufficient documentation

## 2014-09-21 DIAGNOSIS — O10912 Unspecified pre-existing hypertension complicating pregnancy, second trimester: Secondary | ICD-10-CM

## 2014-09-28 ENCOUNTER — Other Ambulatory Visit (HOSPITAL_COMMUNITY): Payer: Medicaid Other

## 2014-09-28 ENCOUNTER — Ambulatory Visit (HOSPITAL_COMMUNITY): Admission: RE | Admit: 2014-09-28 | Payer: Medicaid Other | Source: Ambulatory Visit

## 2014-10-05 ENCOUNTER — Ambulatory Visit (HOSPITAL_COMMUNITY)
Admission: RE | Admit: 2014-10-05 | Discharge: 2014-10-05 | Disposition: A | Discharge status: Home / Self Care | Payer: Medicaid Other | Source: Ambulatory Visit | Attending: Unknown Physician Specialty | Admitting: Unknown Physician Specialty

## 2014-10-05 ENCOUNTER — Other Ambulatory Visit (HOSPITAL_COMMUNITY): Payer: Self-pay | Admitting: Unknown Physician Specialty

## 2014-10-05 ENCOUNTER — Encounter (HOSPITAL_COMMUNITY): Payer: Self-pay

## 2014-10-05 DIAGNOSIS — O09522 Supervision of elderly multigravida, second trimester: Secondary | ICD-10-CM

## 2014-10-05 DIAGNOSIS — O24912 Unspecified diabetes mellitus in pregnancy, second trimester: Secondary | ICD-10-CM

## 2014-10-05 DIAGNOSIS — Z3A35 35 weeks gestation of pregnancy: Secondary | ICD-10-CM | POA: Insufficient documentation

## 2014-10-05 DIAGNOSIS — F17210 Nicotine dependence, cigarettes, uncomplicated: Secondary | ICD-10-CM | POA: Insufficient documentation

## 2014-10-05 DIAGNOSIS — O10919 Unspecified pre-existing hypertension complicating pregnancy, unspecified trimester: Secondary | ICD-10-CM | POA: Insufficient documentation

## 2014-10-05 DIAGNOSIS — O10912 Unspecified pre-existing hypertension complicating pregnancy, second trimester: Secondary | ICD-10-CM

## 2014-10-05 DIAGNOSIS — O10013 Pre-existing essential hypertension complicating pregnancy, third trimester: Secondary | ICD-10-CM | POA: Diagnosis present

## 2014-10-05 DIAGNOSIS — O99333 Smoking (tobacco) complicating pregnancy, third trimester: Secondary | ICD-10-CM | POA: Insufficient documentation

## 2014-10-05 DIAGNOSIS — O09523 Supervision of elderly multigravida, third trimester: Secondary | ICD-10-CM | POA: Diagnosis not present

## 2014-10-05 DIAGNOSIS — O24113 Pre-existing diabetes mellitus, type 2, in pregnancy, third trimester: Secondary | ICD-10-CM | POA: Diagnosis not present

## 2014-10-05 DIAGNOSIS — O289 Unspecified abnormal findings on antenatal screening of mother: Secondary | ICD-10-CM | POA: Diagnosis not present

## 2014-10-05 DIAGNOSIS — E119 Type 2 diabetes mellitus without complications: Secondary | ICD-10-CM | POA: Insufficient documentation

## 2015-01-05 ENCOUNTER — Encounter (HOSPITAL_COMMUNITY): Payer: Self-pay | Admitting: Emergency Medicine

## 2015-01-05 ENCOUNTER — Emergency Department (HOSPITAL_COMMUNITY)
Admission: EM | Admit: 2015-01-05 | Discharge: 2015-01-05 | Disposition: A | Discharge status: Home / Self Care | Payer: Medicaid Other | Attending: Emergency Medicine | Admitting: Emergency Medicine

## 2015-01-05 DIAGNOSIS — Z7952 Long term (current) use of systemic steroids: Secondary | ICD-10-CM | POA: Insufficient documentation

## 2015-01-05 DIAGNOSIS — Z794 Long term (current) use of insulin: Secondary | ICD-10-CM | POA: Insufficient documentation

## 2015-01-05 DIAGNOSIS — Z720 Tobacco use: Secondary | ICD-10-CM | POA: Insufficient documentation

## 2015-01-05 DIAGNOSIS — Z79899 Other long term (current) drug therapy: Secondary | ICD-10-CM | POA: Insufficient documentation

## 2015-01-05 DIAGNOSIS — E119 Type 2 diabetes mellitus without complications: Secondary | ICD-10-CM | POA: Insufficient documentation

## 2015-01-05 DIAGNOSIS — J45909 Unspecified asthma, uncomplicated: Secondary | ICD-10-CM | POA: Insufficient documentation

## 2015-01-05 DIAGNOSIS — N61 Inflammatory disorders of breast: Secondary | ICD-10-CM | POA: Insufficient documentation

## 2015-01-05 DIAGNOSIS — Z8659 Personal history of other mental and behavioral disorders: Secondary | ICD-10-CM | POA: Diagnosis not present

## 2015-01-05 DIAGNOSIS — N644 Mastodynia: Secondary | ICD-10-CM | POA: Diagnosis present

## 2015-01-05 MED ORDER — DOXYCYCLINE HYCLATE 100 MG PO CAPS: 100.0000 mg | ORAL_CAPSULE | Freq: Two times a day (BID) | ORAL | Status: DC

## 2015-01-05 MED ORDER — DOXYCYCLINE HYCLATE 100 MG PO TABS
100.0000 mg | ORAL_TABLET | Freq: Once | ORAL | Status: AC
  Administered 2015-01-05: 100 mg via ORAL
  Filled 2015-01-05: qty 1

## 2015-01-05 NOTE — Discharge Instructions (Signed)
Cellulitis Cellulitis is an infection of the skin and the tissue under the skin. The infected area is usually red and tender. This happens most often in the arms and lower legs. HOME CARE   Take your antibiotic medicine as told. Finish the medicine even if you start to feel better.  Keep the infected arm or leg raised (elevated).  Put a warm cloth on the area up to 4 times per day.  Only take medicines as told by your doctor.  Keep all doctor visits as told. GET HELP IF:  You see red streaks on the skin coming from the infected area.  Your red area gets bigger or turns a dark color.  Your bone or joint under the infected area is painful after the skin heals.  Your infection comes back in the same area or different area.  You have a puffy (swollen) bump in the infected area.  You have new symptoms.  You have a fever. GET HELP RIGHT AWAY IF:   You feel very sleepy.  You throw up (vomit) or have watery poop (diarrhea).  You feel sick and have muscle aches and pains. MAKE SURE YOU:   Understand these instructions.  Will watch your condition.  Will get help right away if you are not doing well or get worse. Document Released: 02/10/2008 Document Revised: 01/08/2014 Document Reviewed: 11/09/2011 [facility] Patient Information 2015 [place], [place]. This information is not intended to replace advice given to you by your health care provider. Make sure you discuss any questions you have with your health care provider.  Warm moist pack to breasts. Keep very clean. Antibiotic twice a day. Return for worsening infection

## 2015-01-05 NOTE — ED Provider Notes (Signed)
CSN: [id number]     Arrival date & time 01/05/15  1512 History   First MD Initiated Contact with Patient 01/05/15 1541     Chief Complaint  Patient presents with  . Breast Pain     (Consider location/radiation/quality/duration/timing/severity/associated sxs/prior Treatment) HPI.... Right nipple swelling and green discharge for 1 week, getting worse. Patient reports fever and chills. She is a diabetic. Severity is mild to moderate. No other complaints.  Past Medical History  Diagnosis Date  . Diabetes mellitus   . Asthma   . Depression    Past Surgical History  Procedure Laterality Date  . Cesarean section    . Tonsillectomy     History reviewed. No pertinent family history. History  Substance Use Topics  . Smoking status: Current Every Day Smoker -- 0.50 packs/day    Types: Cigarettes  . Smokeless tobacco: Not on file  . Alcohol Use: No   OB History    Gravida Para Term Preterm AB TAB SAB Ectopic Multiple Living   4 1 1  2  2   1      Review of Systems  All other systems reviewed and are negative.     Allergies  Bee venom and Sulfa antibiotics  Home Medications   Prior to Admission medications   Medication Sig Start Date End Date Taking? Authorizing Provider  albuterol (PROVENTIL HFA;VENTOLIN HFA) 108 (90 BASE) MCG/ACT inhaler Inhale 2 puffs into the lungs every 6 (six) hours as needed for wheezing or shortness of breath.    Historical Provider, MD  Dextromethorphan-Guaifenesin (ROBITUSSIN DM PO) Take by mouth.    Historical Provider, MD  DiphenhydrAMINE HCl (BENADRYL ALLERGY PO) Take by mouth.    Historical Provider, MD  doxycycline (VIBRAMYCIN) 100 MG capsule Take 1 capsule (100 mg total) by mouth 2 (two) times daily. 01/05/15   Nat Christen, MD  insulin aspart (NOVOLOG) 100 UNIT/ML injection Inject 20 Units into the skin 3 (three) times daily before meals.    Historical Provider, MD  Insulin Glargine (LANTUS SOLOSTAR) 100 UNIT/ML SOPN Inject 55 Units into the skin  at bedtime.     Historical Provider, MD  permethrin (ELIMITE) 5 % cream Apply to affected area once Patient not taking: Reported on 08/04/2014 02/23/14   Ashley Murrain, NP  predniSONE (DELTASONE) 20 MG tablet Take 1 tablet (20 mg total) by mouth daily. Patient not taking: Reported on 10/05/2014 08/04/14   Lily Kocher, PA-C  Prenatal Vit-Fe Fumarate-FA (MULTIVITAMIN-PRENATAL) 27-0.8 MG TABS tablet Take 1 tablet by mouth daily at 12 noon.    Historical Provider, MD   BP 140/77 mmHg  Pulse 84  Temp(Src) 97.8 F (36.6 C) (Oral)  Resp 18  Ht 5\' 6"  (1.676 m)  Wt 217 lb (98.431 kg)  BMI 35.04 kg/m2  SpO2 100%  LMP 12/31/2014  Breastfeeding? Unknown Physical Exam  Constitutional: She is oriented to person, place, and time. She appears well-developed and well-nourished.  HENT:  Head: Normocephalic and atraumatic.  Eyes: Conjunctivae and EOM are normal. Pupils are equal, round, and reactive to light.  Neck: Normal range of motion. Neck supple.  Cardiovascular: Normal rate and regular rhythm.   Pulmonary/Chest: Effort normal and breath sounds normal.  Abdominal: Soft. Bowel sounds are normal.  Musculoskeletal: Normal range of motion.  Neurological: She is alert and oriented to person, place, and time.  Skin: Skin is warm and dry.  Left breast normal. Right breast: Nipple is tender and boggy. Minimal discharge. Area of induration below nipple approximately 1  cm diameter.  Psychiatric: She has a normal mood and affect. Her behavior is normal.  Nursing note and vitals reviewed.   ED Course  Procedures (including critical care time) Labs Review Labs Reviewed - No data to display  Imaging Review No results found.   EKG Interpretation None      MDM   Final diagnoses:  Cellulitis of female breast    Patient is nontoxic-appearing. Rx doxycycline 100 mg twice a day. Patient given strict return precautions.    Nat Christen, MD 01/05/15 1620

## 2015-01-05 NOTE — ED Notes (Signed)
PT c/o right breast pain and redness to nipple with pain x1 week. PT also states she has had cold chills with thought fever x3 days.

## 2015-07-15 ENCOUNTER — Ambulatory Visit: Payer: Self-pay | Admitting: Physician Assistant

## 2015-07-29 ENCOUNTER — Ambulatory Visit: Payer: Self-pay | Admitting: Physician Assistant

## 2015-09-12 ENCOUNTER — Encounter: Payer: Self-pay | Admitting: Physician Assistant

## 2015-09-12 ENCOUNTER — Ambulatory Visit: Payer: Self-pay | Admitting: Physician Assistant

## 2015-09-12 VITALS — BP 134/86 | HR 88 | Temp 97.5°F | Wt 221.2 lb

## 2015-09-12 DIAGNOSIS — N61 Mastitis without abscess: Secondary | ICD-10-CM

## 2015-09-12 MED ORDER — DICLOXACILLIN SODIUM 500 MG PO CAPS: 500.0000 mg | ORAL_CAPSULE | Freq: Four times a day (QID) | ORAL | Status: DC

## 2015-09-12 NOTE — Patient Instructions (Signed)
Mastitis Mastitis is inflammation of the breast tissue. It occurs most often in women who are breastfeeding, but it can also affect other women, and even sometimes men. CAUSES  Mastitis is usually caused by a bacterial infection. Bacteria enter the breast tissue through cuts or openings in the skin. Typically, this occurs with breastfeeding because of cracked or irritated skin. Sometimes, it can occur even when there is no opening in the skin. It can be associated with plugged milk (lactiferous) ducts. Nipple piercing can also lead to mastitis. Also, some forms of breast cancer can cause mastitis. SIGNS AND SYMPTOMS   Swelling, redness, tenderness, and pain in an area of the breast.  Swelling of the glands under the arm on the same side.  Fever. If an infection is allowed to progress, a collection of pus (abscess) may develop. DIAGNOSIS  Your health care provider can usually diagnose mastitis based on your symptoms and a physical exam. Tests may be done to help confirm the diagnosis. These may include:   Removal of pus from the breast by applying pressure to the area. This pus can be examined in the lab to determine which bacteria are present. If an abscess has developed, the fluid in the abscess can be removed with a needle. This can also be used to confirm the diagnosis and determine the bacteria present. In most cases, pus will not be present.  Blood tests to determine if your body is fighting a bacterial infection.  Mammogram or ultrasound tests to rule out other problems or diseases. TREATMENT  Antibiotic medicine is used to treat a bacterial infection. Your health care provider will determine which bacteria are most likely causing the infection and will select an appropriate antibiotic. This is sometimes changed based on the results of tests performed to identify the bacteria, or if there is no response to the antibiotic selected. Antibiotics are usually given by mouth. You may also be  given medicine for pain. Mastitis that occurs with breastfeeding will sometimes go away on its own, so your health care provider may choose to wait 24 hours after first seeing you to decide whether a prescription medicine is needed. HOME CARE INSTRUCTIONS   Only take over-the-counter or prescription medicines for pain, fever, or discomfort as directed by your health care provider.  If your health care provider prescribed an antibiotic, take the medicine as directed. Make sure you finish it even if you start to feel better.  Do not wear a tight or underwire bra. Wear a soft, supportive bra.  Increase your fluid intake, especially if you have a fever.  Women who are breastfeeding should follow these instructions:  Continue to empty the breast. Your health care provider can tell you whether this milk is safe for your infant or needs to be thrown out. You may be told to stop nursing until your health care provider thinks it is safe for your baby. Use a breast pump if you are advised to stop nursing.  Keep your nipples clean and dry.  Empty the first breast completely before going to the other breast. If your baby is not emptying your breasts completely for some reason, use a breast pump to empty your breasts.  If you go back to work, pump your breasts while at work to stay in time with your nursing schedule.  Avoid allowing your breasts to become overly filled with milk (engorged). SEEK MEDICAL CARE IF:   You have pus-like discharge from the breast.  Your symptoms do not   Avoid allowing your breasts to become overly filled with milk (engorged).  SEEK MEDICAL CARE IF:   · You have pus-like discharge from the breast.  · Your symptoms do not improve with the treatment prescribed by your health care provider within 2 days.  SEEK IMMEDIATE MEDICAL CARE IF:   · Your pain and swelling are getting worse.  · You have pain that is not controlled with medicine.  · You have a red line extending from the breast toward your armpit.  · You have a fever or persistent symptoms for more than 2-3 days.  · You have a fever and your symptoms suddenly get worse.     This information  is not intended to replace advice given to you by your health care provider. Make sure you discuss any questions you have with your health care provider.     Document Released: 08/24/2005 Document Revised: 08/29/2013 Document Reviewed: 03/24/2013  Elsevier Interactive Patient Education ©2016 [employer]

## 2015-09-12 NOTE — Progress Notes (Signed)
   BP 134/86 mmHg  Pulse 88  Temp(Src) 97.5 F (36.4 C)  Wt 221 lb 3.2 oz (100.336 kg)  SpO2 99%   Subjective:    Patient ID: Shari Collins, female    DOB: [birth year], 40 y.o.   MRN: [id number]  HPI: Shari Collins is a 40 y.o. female presenting on 09/12/2015 for Breast Problem   HPI   Pt states L breast pain for one week.  No nipple discharge.  LMP was 08/22/15.   She is also feeling nauseous, dizzy and c/o chills.  Pt had a miscarriage in February.   Relevant past medical, surgical, family and social history reviewed and updated as indicated. Interim medical history since our last visit reviewed. Allergies and medications reviewed and updated.  CURRENT MEDS: Albuterol MDI citalpram  Clonazepam Insulin novolog Insulin lantus risperdal  Review of Systems  Constitutional: Positive for chills, diaphoresis and fatigue. Negative for fever, appetite change and unexpected weight change.  HENT: Negative for congestion, dental problem, drooling, ear pain, facial swelling, hearing loss, mouth sores, sneezing, sore throat, trouble swallowing and voice change.   Eyes: Negative for pain, discharge, redness, itching and visual disturbance.  Respiratory: Positive for shortness of breath. Negative for cough, choking and wheezing.   Cardiovascular: Positive for palpitations. Negative for chest pain and leg swelling.  Gastrointestinal: Positive for diarrhea. Negative for vomiting, abdominal pain, constipation and blood in stool.  Endocrine: Negative for cold intolerance, heat intolerance and polydipsia.  Genitourinary: Negative for dysuria, hematuria and decreased urine volume.  Musculoskeletal: Negative for back pain, arthralgias and gait problem.  Skin: Negative for rash.  Allergic/Immunologic: Negative for environmental allergies.  Neurological: Negative for seizures, syncope, light-headedness and headaches.  Hematological: Negative for adenopathy.  Psychiatric/Behavioral: Negative for  suicidal ideas, dysphoric mood and agitation. The patient is not nervous/anxious.     Per HPI unless specifically indicated above     Objective:    BP 134/86 mmHg  Pulse 88  Temp(Src) 97.5 F (36.4 C)  Wt 221 lb 3.2 oz (100.336 kg)  SpO2 99%  Wt Readings from Last 3 Encounters:  09/12/15 221 lb 3.2 oz (100.336 kg)  01/05/15 217 lb (98.431 kg)  10/05/14 261 lb 4 oz (118.502 kg)    Physical Exam  Constitutional:  Pt is weraing a tank top today (outside temp 33 with 29 deg wind chill)  Genitourinary: There is breast tenderness. No breast swelling, discharge or bleeding.  approx 3cm mass L breast        Assessment & Plan:   Encounter Diagnosis  Name Primary?  . Mastitis Yes    Warm compressess rx dicloxacillin F/u 2 wk. RTO sooner prn worsening or new symptoms

## 2015-09-21 ENCOUNTER — Emergency Department (HOSPITAL_COMMUNITY)
Admission: EM | Admit: 2015-09-21 | Discharge: 2015-09-22 | Disposition: A | Discharge status: Home / Self Care | Payer: Medicaid Other | Attending: Emergency Medicine | Admitting: Emergency Medicine

## 2015-09-21 ENCOUNTER — Encounter (HOSPITAL_COMMUNITY): Payer: Self-pay | Admitting: *Deleted

## 2015-09-21 DIAGNOSIS — F329 Major depressive disorder, single episode, unspecified: Secondary | ICD-10-CM | POA: Insufficient documentation

## 2015-09-21 DIAGNOSIS — Z91040 Latex allergy status: Secondary | ICD-10-CM | POA: Insufficient documentation

## 2015-09-21 DIAGNOSIS — J45909 Unspecified asthma, uncomplicated: Secondary | ICD-10-CM | POA: Insufficient documentation

## 2015-09-21 DIAGNOSIS — Z79899 Other long term (current) drug therapy: Secondary | ICD-10-CM | POA: Insufficient documentation

## 2015-09-21 DIAGNOSIS — N61 Mastitis without abscess: Secondary | ICD-10-CM | POA: Insufficient documentation

## 2015-09-21 DIAGNOSIS — Z794 Long term (current) use of insulin: Secondary | ICD-10-CM | POA: Insufficient documentation

## 2015-09-21 DIAGNOSIS — E1165 Type 2 diabetes mellitus with hyperglycemia: Secondary | ICD-10-CM | POA: Insufficient documentation

## 2015-09-21 DIAGNOSIS — F17210 Nicotine dependence, cigarettes, uncomplicated: Secondary | ICD-10-CM | POA: Insufficient documentation

## 2015-09-21 DIAGNOSIS — Z792 Long term (current) use of antibiotics: Secondary | ICD-10-CM | POA: Insufficient documentation

## 2015-09-21 DIAGNOSIS — R739 Hyperglycemia, unspecified: Secondary | ICD-10-CM

## 2015-09-21 LAB — BASIC METABOLIC PANEL
ANION GAP: 9 (ref 5–15)
BUN: 9 mg/dL (ref 6–20)
CALCIUM: 9 mg/dL (ref 8.9–10.3)
CO2: 26 mmol/L (ref 22–32)
CREATININE: 0.61 mg/dL (ref 0.44–1.00)
Chloride: 99 mmol/L — ABNORMAL LOW (ref 101–111)
GFR calc Af Amer: >60 mL/min (ref >60)
GFR calc non Af Amer: >60 mL/min (ref >60)
GLUCOSE: 357 mg/dL — AB (ref 65–99)
Potassium: 4.3 mmol/L (ref 3.5–5.1)
Sodium: 134 mmol/L — ABNORMAL LOW (ref 135–145)

## 2015-09-21 LAB — CBC
HEMATOCRIT: 42.4 % (ref 36.0–46.0)
Hemoglobin: 14.8 g/dL (ref 12.0–15.0)
MCH: 30.8 pg (ref 26.0–34.0)
MCHC: 34.9 g/dL (ref 30.0–36.0)
MCV: 88.1 fL (ref 78.0–100.0)
Platelets: 369 10*3/uL (ref 150–400)
RBC: 4.81 MIL/uL (ref 3.87–5.11)
RDW: 13.3 % (ref 11.5–15.5)
WBC: 11.9 10*3/uL — ABNORMAL HIGH (ref 4.0–10.5)

## 2015-09-21 LAB — CBG MONITORING, ED: GLUCOSE-CAPILLARY: 319 mg/dL — AB (ref 65–99)

## 2015-09-21 MED ORDER — INSULIN ASPART 100 UNIT/ML ~~LOC~~ SOLN
10.0000 [IU] | Freq: Once | SUBCUTANEOUS | Status: AC
  Administered 2015-09-21: 10 [IU] via SUBCUTANEOUS
  Filled 2015-09-21: qty 1

## 2015-09-21 MED ORDER — HYDROCODONE-ACETAMINOPHEN 5-325 MG PO TABS: 2.0000 | ORAL_TABLET | ORAL | Status: DC | PRN

## 2015-09-21 MED ORDER — SODIUM CHLORIDE 0.9 % IV BOLUS (SEPSIS)
1000.0000 mL | Freq: Once | INTRAVENOUS | Status: AC
Start: 2015-09-21 — End: 2015-09-22
  Administered 2015-09-21: 1000 mL via INTRAVENOUS

## 2015-09-21 MED ORDER — VANCOMYCIN HCL IN DEXTROSE 1-5 GM/200ML-% IV SOLN
1000.0000 mg | Freq: Once | INTRAVENOUS | Status: AC
  Administered 2015-09-21: 1000 mg via INTRAVENOUS
  Filled 2015-09-21: qty 200

## 2015-09-21 MED ORDER — DOXYCYCLINE HYCLATE 100 MG PO CAPS: 100.0000 mg | ORAL_CAPSULE | Freq: Two times a day (BID) | ORAL | Status: DC

## 2015-09-21 MED ORDER — SODIUM CHLORIDE 0.9 % IV BOLUS (SEPSIS)
1000.0000 mL | Freq: Once | INTRAVENOUS | Status: AC
  Administered 2015-09-21: 1000 mL via INTRAVENOUS

## 2015-09-21 MED ORDER — NAPROXEN 500 MG PO TABS: 500.0000 mg | ORAL_TABLET | Freq: Two times a day (BID) | ORAL | Status: DC

## 2015-09-21 NOTE — ED Provider Notes (Signed)
CSN: [id number]     Arrival date & time 09/21/15  1910 History   First MD Initiated Contact with Patient 09/21/15 1938     Chief Complaint  Patient presents with  . Breast Pain     (Consider location/radiation/quality/duration/timing/severity/associated sxs/prior Treatment) HPI Comments: The patient is a 40 year old female, she has a history of diabetes, asthma and has a prior history of cellulitis of her breasts. She was diagnosed initially with mastitis approximately 9 days ago, started on dicloxacillin which she was taking 4 times per day but states that over the last 9 days her breast has gradually worsened causing increased redness, increased pain. Initially she was having some green drainage from her bilateral nipples, the left nipple has not draining as well as the right. She has no fevers, no vomiting, no other sites of pain or infection.  The history is provided by the patient.    Past Medical History  Diagnosis Date  . Diabetes mellitus   . Asthma   . Depression    Past Surgical History  Procedure Laterality Date  . Cesarean section    . Tonsillectomy     History reviewed. No pertinent family history. Social History  Substance Use Topics  . Smoking status: Current Every Day Smoker -- 0.50 packs/day    Types: Cigarettes  . Smokeless tobacco: None  . Alcohol Use: No   OB History    Gravida Para Term Preterm AB TAB SAB Ectopic Multiple Living   4 1 1  2  2   1      Review of Systems  All other systems reviewed and are negative.     Allergies  Bee venom; Latex; and Sulfa antibiotics  Home Medications   Prior to Admission medications   Medication Sig Start Date End Date Taking? Authorizing Provider  citalopram (CELEXA) 40 MG tablet Take 80 mg by mouth daily.   Yes Historical Provider, MD  clonazePAM (KLONOPIN) 0.5 MG tablet Take 0.5 mg by mouth 2 (two) times daily.   Yes Historical Provider, MD  dicloxacillin (DYNAPEN) 500 MG capsule Take 1 capsule (500 mg  total) by mouth 4 (four) times daily. 09/12/15  Yes Soyla Dryer, PA-C  ibuprofen (ADVIL,MOTRIN) 200 MG tablet Take 800 mg by mouth every 6 (six) hours as needed for mild pain.   Yes Historical Provider, MD  insulin aspart (NOVOLOG) 100 UNIT/ML injection Inject 25 Units into the skin 3 (three) times daily with meals.    Yes Historical Provider, MD  Insulin Glargine (LANTUS SOLOSTAR) 100 UNIT/ML SOPN Inject 30 Units into the skin at bedtime.    Yes Historical Provider, MD  RisperiDONE (RISPERDAL PO) Take by mouth 2 (two) times daily.   Yes Historical Provider, MD  albuterol (PROVENTIL HFA;VENTOLIN HFA) 108 (90 BASE) MCG/ACT inhaler Inhale 2 puffs into the lungs every 6 (six) hours as needed for wheezing or shortness of breath.    Historical Provider, MD  doxycycline (VIBRAMYCIN) 100 MG capsule Take 1 capsule (100 mg total) by mouth 2 (two) times daily. 09/21/15   Noemi Chapel, MD  HYDROcodone-acetaminophen (NORCO/VICODIN) 5-325 MG tablet Take 2 tablets by mouth every 4 (four) hours as needed. 09/21/15   Noemi Chapel, MD  naproxen (NAPROSYN) 500 MG tablet Take 1 tablet (500 mg total) by mouth 2 (two) times daily with a meal. 09/21/15   Noemi Chapel, MD   BP 158/84 mmHg  Pulse 90  Temp(Src) 99.3 F (37.4 C) (Oral)  Resp 20  Ht 5\' 6"  (1.676 m)  Wt 221 lb (100.245 kg)  BMI 35.69 kg/m2  SpO2 98%  LMP 09/15/2015 Physical Exam  Constitutional: She appears well-developed and well-nourished. No distress.  HENT:  Head: Normocephalic and atraumatic.  Mouth/Throat: Oropharynx is clear and moist. No oropharyngeal exudate.  Eyes: Conjunctivae and EOM are normal. Pupils are equal, round, and reactive to light. Right eye exhibits no discharge. Left eye exhibits no discharge. No scleral icterus.  Neck: Normal range of motion. Neck supple. No JVD present. No thyromegaly present.  Cardiovascular: Normal rate, regular rhythm, normal heart sounds and intact distal pulses.  Exam reveals no gallop and no friction  rub.   No murmur heard. Pulmonary/Chest: Effort normal and breath sounds normal. No respiratory distress. She has no wheezes. She has no rales.  Abdominal: Soft. Bowel sounds are normal. She exhibits no distension and no mass. There is no tenderness.  Genitourinary:  Chaperone present for exam, left breast with swelling redness around the nipple with swelling and induration of the skin - no fluctuance  Musculoskeletal: Normal range of motion. She exhibits no edema or tenderness.  Lymphadenopathy:    She has no cervical adenopathy.  Neurological: She is alert. Coordination normal.  Skin: Skin is warm and dry. No rash noted. No erythema.  Psychiatric: She has a normal mood and affect. Her behavior is normal.  Nursing note and vitals reviewed.   ED Course  Procedures (including critical care time) Labs Review Labs Reviewed  CBC - Abnormal; Notable for the following:    WBC 11.9 (*)    All other components within normal limits  BASIC METABOLIC PANEL - Abnormal; Notable for the following:    Sodium 134 (*)    Chloride 99 (*)    Glucose, Bld 357 (*)    All other components within normal limits    Imaging Review No results found. I have personally reviewed and evaluated these images and lab results as part of my medical decision-making.    MDM   Final diagnoses:  Cellulitis of female breast  Hyperglycemia    The patient does state that she has had multiple ultrasounds of the breast. They have been following her but she is unsure exactly why. She does not have any prior history of biopsy or surgery. She has no fever or tachycardia however she does have breast tissue that appears to be infected. Given her resistance to dicloxacillin that would be suggestive of MRSA, we'll order vancomycin, labs, bedside ultrasound shows no abscess, she does have cellulitis based on cobblestoning. She will need to have follow-up with her family doctor or with a general surgeon if this gets worse. I  have encouraged her to return to the emergency department for severe swelling fever or redness.  EMERGENCY DEPARTMENT US SOFT TISSUE INTERPRETATION "Study: Limited Ultrasound of the noted body part in comments below"  INDICATIONS: Soft tissue infection Multiple views of the body part are obtained with a multi-frequency linear probe  PERFORMED BY:  Myself  IMAGES ARCHIVED?: Yes  SIDE:Left and Right   BODY PART:Breast  FINDINGS: No abcess noted and Cellulitis present  LIMITATIONS:  Body Habitus and Emergent Procedure  INTERPRETATION:  No abcess noted and Cellulitis present  COMMENT:  R side was normal - L side with cobblestoning  Teh pt has an elevated blood sugar - states she has not been using her insulin today, she has no DKA on labs - she has vancomycin given, fluids given, insulin given - pt stable for d/c.  Meds given in ED:  Medications  vancomycin (VANCOCIN) IVPB 1000 mg/200 mL premix (1,000 mg Intravenous New Bag/Given 09/21/15 2013)  insulin aspart (novoLOG) injection 10 Units (10 Units Subcutaneous Given 09/21/15 2152)  sodium chloride 0.9 % bolus 1,000 mL (1,000 mLs Intravenous New Bag/Given 09/21/15 2152)    New Prescriptions   DOXYCYCLINE (VIBRAMYCIN) 100 MG CAPSULE    Take 1 capsule (100 mg total) by mouth 2 (two) times daily.   HYDROCODONE-ACETAMINOPHEN (NORCO/VICODIN) 5-325 MG TABLET    Take 2 tablets by mouth every 4 (four) hours as needed.   NAPROXEN (NAPROSYN) 500 MG TABLET    Take 1 tablet (500 mg total) by mouth 2 (two) times daily with a meal.      Noemi Chapel, MD 09/21/15 2209

## 2015-09-21 NOTE — Discharge Instructions (Signed)

## 2015-09-21 NOTE — ED Notes (Signed)
Pt states she was diagnosed mastitis Jan. 6th and was placed on an antibiotic Dicloxacillin 500 mg 4 x a day.  Pt states her left breast looks worse than it did before she began treatment, Pt c/o nausea as well.

## 2015-09-22 LAB — CBG MONITORING, ED: GLUCOSE-CAPILLARY: 228 mg/dL — AB (ref 65–99)

## 2015-09-22 NOTE — ED Notes (Signed)
Pt states understanding of care given and follow up instructions.  Ambulated from ED  

## 2015-09-24 ENCOUNTER — Encounter: Payer: Self-pay | Admitting: Physician Assistant

## 2015-09-24 ENCOUNTER — Ambulatory Visit: Payer: Self-pay | Admitting: Physician Assistant

## 2015-09-24 VITALS — BP 136/88 | HR 91 | Temp 97.7°F | Ht 66.0 in | Wt 224.6 lb

## 2015-09-24 DIAGNOSIS — IMO0002 Reserved for concepts with insufficient information to code with codable children: Secondary | ICD-10-CM | POA: Insufficient documentation

## 2015-09-24 DIAGNOSIS — E1165 Type 2 diabetes mellitus with hyperglycemia: Secondary | ICD-10-CM

## 2015-09-24 DIAGNOSIS — Z91199 Patient's noncompliance with other medical treatment and regimen due to unspecified reason: Secondary | ICD-10-CM | POA: Insufficient documentation

## 2015-09-24 DIAGNOSIS — Z9119 Patient's noncompliance with other medical treatment and regimen: Secondary | ICD-10-CM

## 2015-09-24 DIAGNOSIS — N6320 Unspecified lump in the left breast, unspecified quadrant: Secondary | ICD-10-CM

## 2015-09-24 DIAGNOSIS — E118 Type 2 diabetes mellitus with unspecified complications: Secondary | ICD-10-CM

## 2015-09-24 NOTE — Progress Notes (Signed)
BP 136/88 mmHg  Pulse 91  Temp(Src) 97.7 F (36.5 C)  Ht 5\' 6"  (1.676 m)  Wt 224 lb 9.6 oz (101.878 kg)  BMI 36.27 kg/m2  SpO2 98%  LMP 09/15/2015   Subjective:    Patient ID: Shari Collins, female    DOB: [birth year], 40 y.o.   MRN: [id number]  HPI: Shari Collins is a 40 y.o. female presenting on 09/24/2015 for Breast Mass   HPI Pt went to ER on Saturday jan 14 but she has still not started on any of the meds she was given.  She is still taking the diclox that she she got when she was here on 09/12/15 .  Relevant past medical, surgical, family and social history reviewed and updated as indicated. Interim medical history since our last visit reviewed. Allergies and medications reviewed and updated.  Current outpatient prescriptions:  .  albuterol (PROVENTIL HFA;VENTOLIN HFA) 108 (90 BASE) MCG/ACT inhaler, Inhale 2 puffs into the lungs every 6 (six) hours as needed for wheezing or shortness of breath., Disp: , Rfl:  .  citalopram (CELEXA) 40 MG tablet, Take 40 mg by mouth 2 (two) times daily. , Disp: , Rfl:  .  clonazePAM (KLONOPIN) 0.5 MG tablet, Take 0.5 mg by mouth 2 (two) times daily., Disp: , Rfl:  .  dicloxacillin (DYNAPEN) 500 MG capsule, Take 1 capsule (500 mg total) by mouth 4 (four) times daily., Disp: 40 capsule, Rfl: 0 .  ibuprofen (ADVIL,MOTRIN) 200 MG tablet, Take 800 mg by mouth every 6 (six) hours as needed for mild pain., Disp: , Rfl:  .  insulin aspart (NOVOLOG) 100 UNIT/ML injection, Inject 25 Units into the skin 3 (three) times daily with meals. , Disp: , Rfl:  .  Insulin Glargine (LANTUS SOLOSTAR) 100 UNIT/ML SOPN, Inject 50 Units into the skin at bedtime. , Disp: , Rfl:  .  RisperiDONE (RISPERDAL PO), Take 2 mg by mouth 2 (two) times daily. , Disp: , Rfl:  .  doxycycline (VIBRAMYCIN) 100 MG capsule, Take 1 capsule (100 mg total) by mouth 2 (two) times daily. (Patient not taking: Reported on 09/24/2015), Disp: 20 capsule, Rfl: 0 .  HYDROcodone-acetaminophen  (NORCO/VICODIN) 5-325 MG tablet, Take 2 tablets by mouth every 4 (four) hours as needed. (Patient not taking: Reported on 09/24/2015), Disp: 10 tablet, Rfl: 0 .  naproxen (NAPROSYN) 500 MG tablet, Take 1 tablet (500 mg total) by mouth 2 (two) times daily with a meal. (Patient not taking: Reported on 09/24/2015), Disp: 30 tablet, Rfl: 0   Review of Systems  Constitutional: Positive for chills, diaphoresis and fatigue. Negative for fever, appetite change and unexpected weight change.  HENT: Positive for ear pain. Negative for congestion, dental problem, drooling, facial swelling, hearing loss, mouth sores, sneezing, sore throat, trouble swallowing and voice change.   Eyes: Negative for pain, discharge, redness, itching and visual disturbance.  Respiratory: Negative for cough, choking, shortness of breath and wheezing.   Cardiovascular: Negative for chest pain, palpitations and leg swelling.  Gastrointestinal: Negative for vomiting, abdominal pain, diarrhea, constipation and blood in stool.  Endocrine: Negative for cold intolerance, heat intolerance and polydipsia.  Genitourinary: Negative for dysuria, hematuria and decreased urine volume.  Musculoskeletal: Negative for back pain, arthralgias and gait problem.  Skin: Negative for rash.  Allergic/Immunologic: Negative for environmental allergies.  Neurological: Positive for light-headedness and headaches. Negative for seizures and syncope.  Hematological: Negative for adenopathy.  Psychiatric/Behavioral: Positive for dysphoric mood and agitation. Negative for suicidal ideas. The  patient is nervous/anxious.     Per HPI unless specifically indicated above     Objective:    BP 136/88 mmHg  Pulse 91  Temp(Src) 97.7 F (36.5 C)  Ht 5\' 6"  (1.676 m)  Wt 224 lb 9.6 oz (101.878 kg)  BMI 36.27 kg/m2  SpO2 98%  LMP 09/15/2015  Wt Readings from Last 3 Encounters:  09/24/15 224 lb 9.6 oz (101.878 kg)  09/21/15 221 lb (100.245 kg)  09/12/15 221 lb  3.2 oz (100.336 kg)    Physical Exam  Constitutional: She is oriented to person, place, and time. She appears well-developed and well-nourished.  Pulmonary/Chest: Effort normal.  Genitourinary: There is breast swelling and tenderness. No breast discharge or bleeding.  Left breast mass larger than it was on 09/12/15.  It is tender red hot. No fluctuance.    Neurological: She is alert and oriented to person, place, and time.  Skin: Skin is warm and dry.  Psychiatric: She has a normal mood and affect. Her behavior is normal.  Vitals reviewed.      Assessment & Plan:   Encounter Diagnosis  Name Primary?  . Breast mass, left Yes    -In light of pt allergy to sulfa, pt's best option is the doxycycline that she was given in the ER.  Discussed with Mayra Reel ([facility]) who is assisting pt with financial constraints of getting the medicine. -F/u OV in 10 days.  RTO sooner prn worsening or new symptoms

## 2015-10-02 ENCOUNTER — Emergency Department (HOSPITAL_COMMUNITY): Payer: Self-pay

## 2015-10-02 ENCOUNTER — Encounter (HOSPITAL_COMMUNITY): Payer: Self-pay | Admitting: Emergency Medicine

## 2015-10-02 ENCOUNTER — Inpatient Hospital Stay (HOSPITAL_COMMUNITY)
Admission: EM | Admit: 2015-10-02 | Discharge: 2015-10-03 | DRG: 601 | Disposition: A | Discharge status: Home / Self Care | Payer: Self-pay | Attending: Internal Medicine | Admitting: Internal Medicine

## 2015-10-02 DIAGNOSIS — L0291 Cutaneous abscess, unspecified: Secondary | ICD-10-CM

## 2015-10-02 DIAGNOSIS — J45909 Unspecified asthma, uncomplicated: Secondary | ICD-10-CM | POA: Diagnosis present

## 2015-10-02 DIAGNOSIS — L0390 Cellulitis, unspecified: Secondary | ICD-10-CM

## 2015-10-02 DIAGNOSIS — F209 Schizophrenia, unspecified: Secondary | ICD-10-CM

## 2015-10-02 DIAGNOSIS — IMO0002 Reserved for concepts with insufficient information to code with codable children: Secondary | ICD-10-CM | POA: Diagnosis present

## 2015-10-02 DIAGNOSIS — E1165 Type 2 diabetes mellitus with hyperglycemia: Secondary | ICD-10-CM | POA: Diagnosis present

## 2015-10-02 DIAGNOSIS — F319 Bipolar disorder, unspecified: Secondary | ICD-10-CM | POA: Diagnosis present

## 2015-10-02 DIAGNOSIS — F3181 Bipolar II disorder: Secondary | ICD-10-CM

## 2015-10-02 DIAGNOSIS — E118 Type 2 diabetes mellitus with unspecified complications: Secondary | ICD-10-CM

## 2015-10-02 DIAGNOSIS — N611 Abscess of the breast and nipple: Principal | ICD-10-CM | POA: Diagnosis present

## 2015-10-02 DIAGNOSIS — F17210 Nicotine dependence, cigarettes, uncomplicated: Secondary | ICD-10-CM | POA: Diagnosis present

## 2015-10-02 DIAGNOSIS — Z8249 Family history of ischemic heart disease and other diseases of the circulatory system: Secondary | ICD-10-CM

## 2015-10-02 DIAGNOSIS — Z794 Long term (current) use of insulin: Secondary | ICD-10-CM

## 2015-10-02 LAB — BASIC METABOLIC PANEL
Anion gap: 6 (ref 5–15)
BUN: 12 mg/dL (ref 6–20)
CHLORIDE: 100 mmol/L — AB (ref 101–111)
CO2: 29 mmol/L (ref 22–32)
CREATININE: 0.57 mg/dL (ref 0.44–1.00)
Calcium: 9.1 mg/dL (ref 8.9–10.3)
GFR calc Af Amer: >60 mL/min (ref >60)
GFR calc non Af Amer: >60 mL/min (ref >60)
GLUCOSE: 261 mg/dL — AB (ref 65–99)
Potassium: 4.4 mmol/L (ref 3.5–5.1)
SODIUM: 135 mmol/L (ref 135–145)

## 2015-10-02 LAB — CBC WITH DIFFERENTIAL/PLATELET
Basophils Absolute: 0.1 10*3/uL (ref 0.0–0.1)
Basophils Relative: 1 %
EOS ABS: 0.9 10*3/uL — AB (ref 0.0–0.7)
EOS PCT: 9 %
HCT: 40.2 % (ref 36.0–46.0)
HEMOGLOBIN: 13.9 g/dL (ref 12.0–15.0)
LYMPHS ABS: 2.5 10*3/uL (ref 0.7–4.0)
Lymphocytes Relative: 24 %
MCH: 30.8 pg (ref 26.0–34.0)
MCHC: 34.6 g/dL (ref 30.0–36.0)
MCV: 89.1 fL (ref 78.0–100.0)
MONOS PCT: 4 %
Monocytes Absolute: 0.4 10*3/uL (ref 0.1–1.0)
Neutro Abs: 6.4 10*3/uL (ref 1.7–7.7)
Neutrophils Relative %: 62 %
PLATELETS: 370 10*3/uL (ref 150–400)
RBC: 4.51 MIL/uL (ref 3.87–5.11)
RDW: 13.5 % (ref 11.5–15.5)
WBC: 10.3 10*3/uL (ref 4.0–10.5)

## 2015-10-02 LAB — GLUCOSE, CAPILLARY: GLUCOSE-CAPILLARY: 240 mg/dL — AB (ref 65–99)

## 2015-10-02 LAB — CBG MONITORING, ED: GLUCOSE-CAPILLARY: 225 mg/dL — AB (ref 65–99)

## 2015-10-02 MED ORDER — INSULIN ASPART 100 UNIT/ML ~~LOC~~ SOLN
15.0000 [IU] | Freq: Once | SUBCUTANEOUS | Status: AC
  Administered 2015-10-02: 15 [IU] via SUBCUTANEOUS
  Filled 2015-10-02: qty 1

## 2015-10-02 MED ORDER — INSULIN GLARGINE 100 UNIT/ML ~~LOC~~ SOLN
35.0000 [IU] | Freq: Every day | SUBCUTANEOUS | Status: DC
Start: 2015-10-02 — End: 2015-10-03
  Administered 2015-10-02: 35 [IU] via SUBCUTANEOUS
  Filled 2015-10-02 (×2): qty 0.35

## 2015-10-02 MED ORDER — INSULIN GLARGINE 100 UNIT/ML SOLOSTAR PEN: 35.0000 [IU] | PEN_INJECTOR | Freq: Every day | SUBCUTANEOUS | Status: DC

## 2015-10-02 MED ORDER — ACETAMINOPHEN 650 MG RE SUPP
650.0000 mg | Freq: Four times a day (QID) | RECTAL | Status: DC | PRN
Start: 2015-10-02 — End: 2015-10-03

## 2015-10-02 MED ORDER — VANCOMYCIN HCL IN DEXTROSE 1-5 GM/200ML-% IV SOLN
1000.0000 mg | Freq: Once | INTRAVENOUS | Status: AC
  Administered 2015-10-02: 1000 mg via INTRAVENOUS
  Filled 2015-10-02: qty 200

## 2015-10-02 MED ORDER — INSULIN ASPART 100 UNIT/ML ~~LOC~~ SOLN
10.0000 [IU] | Freq: Three times a day (TID) | SUBCUTANEOUS | Status: DC
  Administered 2015-10-03 (×2): 10 [IU] via SUBCUTANEOUS

## 2015-10-02 MED ORDER — RISPERIDONE 1 MG PO TABS
2.0000 mg | ORAL_TABLET | Freq: Two times a day (BID) | ORAL | Status: DC
  Administered 2015-10-02 – 2015-10-03 (×2): 2 mg via ORAL
  Filled 2015-10-02 (×2): qty 2

## 2015-10-02 MED ORDER — VANCOMYCIN HCL IN DEXTROSE 1-5 GM/200ML-% IV SOLN
1000.0000 mg | Freq: Once | INTRAVENOUS | Status: AC
  Administered 2015-10-02: 1000 mg via INTRAVENOUS

## 2015-10-02 MED ORDER — ONDANSETRON HCL 4 MG/2ML IJ SOLN
4.0000 mg | Freq: Once | INTRAMUSCULAR | Status: AC
  Administered 2015-10-02: 4 mg via INTRAVENOUS
  Filled 2015-10-02: qty 2

## 2015-10-02 MED ORDER — CITALOPRAM HYDROBROMIDE 20 MG PO TABS
40.0000 mg | ORAL_TABLET | Freq: Two times a day (BID) | ORAL | Status: DC
  Administered 2015-10-02 – 2015-10-03 (×2): 40 mg via ORAL
  Filled 2015-10-02 (×2): qty 2

## 2015-10-02 MED ORDER — MORPHINE SULFATE (PF) 4 MG/ML IV SOLN
4.0000 mg | Freq: Once | INTRAVENOUS | Status: AC
  Administered 2015-10-02: 4 mg via INTRAVENOUS
  Filled 2015-10-02: qty 1

## 2015-10-02 MED ORDER — OXYCODONE HCL 5 MG PO TABS
5.0000 mg | ORAL_TABLET | ORAL | Status: DC | PRN
  Administered 2015-10-02 – 2015-10-03 (×2): 5 mg via ORAL
  Filled 2015-10-02 (×3): qty 1

## 2015-10-02 MED ORDER — CLONAZEPAM 0.5 MG PO TABS
0.5000 mg | ORAL_TABLET | Freq: Two times a day (BID) | ORAL | Status: DC
  Administered 2015-10-02 – 2015-10-03 (×2): 0.5 mg via ORAL
  Filled 2015-10-02 (×2): qty 1

## 2015-10-02 MED ORDER — VANCOMYCIN HCL IN DEXTROSE 1-5 GM/200ML-% IV SOLN
1000.0000 mg | Freq: Three times a day (TID) | INTRAVENOUS | Status: DC
  Administered 2015-10-03 (×2): 1000 mg via INTRAVENOUS

## 2015-10-02 MED ORDER — SODIUM CHLORIDE 0.9 % IV SOLN
INTRAVENOUS | Status: DC
  Administered 2015-10-02: 21:00:00 via INTRAVENOUS

## 2015-10-02 MED ORDER — INSULIN ASPART 100 UNIT/ML ~~LOC~~ SOLN
0.0000 [IU] | Freq: Three times a day (TID) | SUBCUTANEOUS | Status: DC
  Administered 2015-10-03 (×2): 3 [IU] via SUBCUTANEOUS

## 2015-10-02 MED ORDER — ENOXAPARIN SODIUM 40 MG/0.4ML ~~LOC~~ SOLN: 40.0000 mg | SUBCUTANEOUS | Status: DC

## 2015-10-02 MED ORDER — ACETAMINOPHEN 325 MG PO TABS
650.0000 mg | ORAL_TABLET | Freq: Four times a day (QID) | ORAL | Status: DC | PRN
Start: 2015-10-02 — End: 2015-10-03

## 2015-10-02 NOTE — ED Notes (Signed)
Pt reports seen for same approx 1 week ago. Pt reports has three remaining abx doses. Pt reports "redness has spread". Moderate redness noted to left breast. Pt denies any known fevers.

## 2015-10-02 NOTE — H&P (Signed)
Patient Demographics  Shari Collins, is a 40 y.o. female  MRN: [id number]   DOB - 16-Sep-1975  Admit Date - 10/02/2015  Outpatient Primary MD for the patient is Soyla Dryer, PA-C   With History of -  Past Medical History  Diagnosis Date  . Diabetes mellitus   . Asthma   . Depression       Past Surgical History  Procedure Laterality Date  . Cesarean section    . Tonsillectomy      in for   Chief Complaint  Patient presents with  . Breast Pain     HPI  Shari Collins  is a 40 y.o. female, with past medical history of diabetes mellitus, schizophrenia and bipolar disorder, presents with complaints of breast pain, patient reports she's been having left breast pain/erythema/month, appears to be started as cellulitis, treated initially with dicloxacillin, with no improvement, switched to doxycycline on 1/14 after ED visit, patient currently denies any nipple discharge, initially reports she had green discharge, but resolved, as well reports redness has significantly improved, but breast is more tender, with more hard feeling in your area, patient has chronic left breast masses, felt to be benign, this was based on ultrasound done in 2014, recommendation for repeat ultrasound every 6 months which she did not follow with, has any fever or chills, a febrile NAD, no leukocytosis, ultrasound left breast significant for abscess, ED consulted Dr. Arnoldo Morale, will consult, hospitalist requested to admit.    Review of Systems    In addition to the HPI above,  No Fever-chills, No Headache, No changes with Vision or hearing, No problems swallowing food or Liquids, No Chest pain, Cough or Shortness of Breath, No Abdominal pain, No Nausea or Vommitting, Bowel movements are regular, No Blood in stool or Urine, No dysuria,  left breast erythema, tenderness No new joints pains-aches,  No new weakness, tingling, numbness in any extremity, No recent weight gain or loss, No polyuria,  polydypsia or polyphagia, No significant Mental Stressors.  A full 10 point Review of Systems was done, except as stated above, all other Review of Systems were negative.   Social History Social History  Substance Use Topics  . Smoking status: Current Every Day Smoker -- 0.50 packs/day    Types: Cigarettes  . Smokeless tobacco: Not on file  . Alcohol Use: No     Family History  family history significant for hypertension . she denies any history of breast cancer   Prior to Admission medications   Medication Sig Start Date End Date Taking? Authorizing Provider  citalopram (CELEXA) 40 MG tablet Take 40 mg by mouth 2 (two) times daily.    Yes Historical Provider, MD  clonazePAM (KLONOPIN) 0.5 MG tablet Take 0.5 mg by mouth 2 (two) times daily.   Yes Historical Provider, MD  dicloxacillin (DYNAPEN) 500 MG capsule Take 1 capsule (500 mg total) by mouth 4 (four) times daily. 09/12/15  Yes Soyla Dryer, PA-C  ibuprofen (ADVIL,MOTRIN) 200 MG tablet Take 800 mg by mouth every 6 (six) hours as needed for mild pain.   Yes Historical Provider, MD  insulin aspart (NOVOLOG) 100 UNIT/ML injection Inject 25 Units into the skin 3 (three) times daily with meals.    Yes Historical Provider, MD  Insulin Glargine (LANTUS SOLOSTAR) 100 UNIT/ML SOPN Inject 50 Units into the skin at bedtime.    Yes Historical Provider, MD  naproxen (NAPROSYN) 500 MG tablet Take 1 tablet (500 mg total) by mouth 2 (two)  times daily with a meal. 09/21/15  Yes Noemi Chapel, MD  RisperiDONE (RISPERDAL PO) Take 2 mg by mouth 2 (two) times daily.    Yes Historical Provider, MD  albuterol (PROVENTIL HFA;VENTOLIN HFA) 108 (90 BASE) MCG/ACT inhaler Inhale 2 puffs into the lungs every 6 (six) hours as needed for wheezing or shortness of breath.    Historical Provider, MD  doxycycline (VIBRAMYCIN) 100 MG capsule Take 1 capsule (100 mg total) by mouth 2 (two) times daily. Patient not taking: Reported on 09/24/2015 09/21/15   Noemi Chapel,  MD  HYDROcodone-acetaminophen (NORCO/VICODIN) 5-325 MG tablet Take 2 tablets by mouth every 4 (four) hours as needed. Patient not taking: Reported on 09/24/2015 09/21/15   Noemi Chapel, MD    Allergies  Allergen Reactions  . Bee Venom Anaphylaxis and Swelling  . Latex Itching  . Sulfa Antibiotics Itching and Rash    Physical Exam  Vitals  Blood pressure 131/65, pulse 74, temperature 98.2 F (36.8 C), temperature source Oral, resp. rate 18, height 5\' 7"  (1.702 m), weight 108.863 kg (240 lb), last menstrual period 09/15/2015, SpO2 96 %, not currently breastfeeding.   physical exam was done with presence of female chaperone from ED staff .  1. General  moderately built well-nourished female lying in bed in NAD,    2. Normal affect and insight, Not Suicidal or Homicidal, Awake Alert, Oriented X 3.  3. No F.N deficits, ALL C.Nerves Intact, Strength 5/5 all 4 extremities, Sensation intact all 4 extremities, Plantars down going.  4. Ears and Eyes appear Normal, Conjunctivae clear, PERRLA. Moist Oral Mucosa.  5. Supple Neck, No JVD, No cervical lymphadenopathy appriciated, No Carotid Bruits.  6. Symmetrical Chest wall movement, Good air movement bilaterally, CTAB.  7. RRR, No Gallops, Rubs or Murmurs, No Parasternal Heave.  8. Positive Bowel Sounds, Abdomen Soft, No tenderness, No organomegaly appriciated,No rebound -guarding or rigidity.  9.  No Cyanosis, Normal Skin Turgor, No Skin Rash or Bruise.  10. Good muscle tone,  joints appear normal , no effusions, Normal ROM.  11.  left breast significant for induration, with erythema and induration , with crusting dark macular lesion at the nipple .   Data Review  CBC  Recent Labs Lab 10/02/15 1720  WBC 10.3  HGB 13.9  HCT 40.2  PLT 370  MCV 89.1  MCH 30.8  MCHC 34.6  RDW 13.5  LYMPHSABS 2.5  MONOABS 0.4  EOSABS 0.9*  BASOSABS 0.1    ------------------------------------------------------------------------------------------------------------------  Chemistries   Recent Labs Lab 10/02/15 1720  NA 135  K 4.4  CL 100*  CO2 29  GLUCOSE 261*  BUN 12  CREATININE 0.57  CALCIUM 9.1   ------------------------------------------------------------------------------------------------------------------ estimated creatinine clearance is 120 mL/min (by C-G formula based on Cr of 0.57). ------------------------------------------------------------------------------------------------------------------ No results for input(s): TSH, T4TOTAL, T3FREE, THYROIDAB in the last 72 hours.  Invalid input(s): FREET3   Coagulation profile No results for input(s): INR, PROTIME in the last 168 hours. ------------------------------------------------------------------------------------------------------------------- No results for input(s): DDIMER in the last 72 hours. -------------------------------------------------------------------------------------------------------------------  Cardiac Enzymes No results for input(s): CKMB, TROPONINI, MYOGLOBIN in the last 168 hours.  Invalid input(s): CK ------------------------------------------------------------------------------------------------------------------ Invalid input(s): POCBNP   ---------------------------------------------------------------------------------------------------------------  Urinalysis    Component Value Date/Time   COLORURINE YELLOW 11/21/2012 1740   APPEARANCEUR CLEAR 11/21/2012 1740   LABSPEC 1.020 11/21/2012 1740   PHURINE 6.0 11/21/2012 1740   GLUCOSEU >1000* 11/21/2012 1740   HGBUR NEGATIVE 11/21/2012 1740   BILIRUBINUR NEGATIVE 11/21/2012 1740   KETONESUR NEGATIVE  11/21/2012 1740   PROTEINUR NEGATIVE 11/21/2012 1740   UROBILINOGEN 0.2 11/21/2012 1740   NITRITE NEGATIVE 11/21/2012 1740   LEUKOCYTESUR NEGATIVE 11/21/2012 1740     ----------------------------------------------------------------------------------------------------------------  Imaging results:   US Breast Ltd Uni Left Inc Axilla  10/02/2015  CLINICAL DATA:  Cellulitis and acute breast abscess. EXAM: ULTRASOUND OF THE LEFT BREAST COMPARISON:  Left breast ultrasound 08/02/2013 FINDINGS: Static images from targeted left breast ultrasound demonstrate skin thickening of up to 7 mm, breast parenchymal edema and a complex fluid collection measuring 6.2 by 5.8 by 4.7 cm centered in the retroareolar left breast. No associated suspicious in appearance breast masses seen. IMPRESSION: Sonographic findings consistent with large retroareolar breast abscess, with associated changes of mastitis and cellulitis. RECOMMENDATION: Ultrasound-guided aspiration for microbiology evaluation and/or surgical consult. I have discussed the findings and recommendations with the patient. Results were also provided in writing at the conclusion of the visit. If applicable, a reminder letter will be sent to the patient regarding the next appointment. BI-RADS CATEGORY  2: Benign Finding(s) Electronically Signed   By: Fidela Salisbury M.D.   On: 10/02/2015 17:56  changes    Assessment & Plan  Active Problems:   Uncontrolled type 2 diabetes mellitus with complication (HCC)   Breast abscess   Schizophrenia (HCC)   Bipolar disorder, unspecified ([place])    breast abscess  - Patient with improvement of cellulitic  Lesion on  oral antibiotic, but with significant induration  , with evidence of abscess on ultrasound , she will be started on IV vancomycin, surgery consulted by ED , will keep patient nothing by mouth after midnight for possible need for I&D in a.m. by surgery , patient with known history of benign left breast lesion, but failed to follow-up on repeat ultrasound ,  will leave it up to surgery to see if biopsy needs to be done .  Diabetes mellitus - Uncontrolled, most likely  due to infection, will resume on lower dose Lantus as she is nothing by mouth after midnight, will decrease Lantus from 50-35, will continue with NovoLog 3 times a day before meals, will lower dose as well, will add insulin sliding scale, check hemoglobin A1c.  Psychiatricy - Patient with history of schizophrenia and bipolar disorder, no active hallucinations currently, no suicidal ideation, continue with home medication.  Tobacco abuse - She was counseled, will start on nicotine patch.     DVT Prophylaxis  Lovenox to start tomorrow p.m.(possible need for surgery in a.m.)  bs Ordered, also please review Full Orders  Family Communication: Admission, patients condition and plan of care including tests being ordered have been discussed with the patient  who indicate understanding and agree with the plan and Code Status.  Code Status Full  Likely DC to  home  Condition GUARDED    Time spent in minutes : 55 minutes    Kariah Loredo M.D on 10/02/2015 at 7:36 PM  Between 7am to 7pm - Pager - [phone]  After 7pm go to [email or web address] - password [email or web address]  And look for the night coverage person covering me after hours  [facility] Office  [phone]

## 2015-10-02 NOTE — Progress Notes (Signed)
ANTIBIOTIC CONSULT NOTE - INITIAL  Pharmacy Consult for Vancomycin Indication: Breast abscess  Allergies  Allergen Reactions  . Bee Venom Anaphylaxis and Swelling  . Latex Itching  . Sulfa Antibiotics Itching and Rash    Patient Measurements: Height: 5\' 7"  (170.2 cm) Weight: 240 lb (108.863 kg) IBW/kg (Calculated) : 61.6  Vital Signs: Temp: 98.2 F (36.8 C) (01/25 1612) Temp Source: Oral (01/25 1612) BP: 131/65 mmHg (01/25 1829) Pulse Rate: 74 (01/25 1829) Intake/Output from previous day:   Intake/Output from this shift:    Labs:  Recent Labs  10/02/15 1720  WBC 10.3  HGB 13.9  PLT 370  CREATININE 0.57   Estimated Creatinine Clearance: 120 mL/min (by C-G formula based on Cr of 0.57). No results for input(s): VANCOTROUGH, VANCOPEAK, VANCORANDOM, GENTTROUGH, GENTPEAK, GENTRANDOM, TOBRATROUGH, TOBRAPEAK, TOBRARND, AMIKACINPEAK, AMIKACINTROU, AMIKACIN in the last 72 hours.   Microbiology: No results found for this or any previous visit (from the past 720 hour(s)).  Medical History: Past Medical History  Diagnosis Date  . Diabetes mellitus   . Asthma   . Depression     Medications:  See med rec  Assessment: 40 yo female with h/o DM presents with breast abscess, that failed tx with dicloxacillin and improved with doxycycline. Breast is still tender but no discharge as before.  No fever, chills, or leukocytosis. Surgery consulted, Empiric tx with vancomycin.  Goal of Therapy:  Vancomycin trough level 10-15 mcg/ml  Plan:  Vancomycin 2gm to be given in ED, then 1gm IV every 8 hours Measure antibiotic drug levels at steady state Follow up culture results  Monitor V/S and labs  Isac Sarna, BS Vena [place], BCPS Clinical Pharmacist Pager [phone] 10/02/2015,8:02 PM

## 2015-10-02 NOTE — ED Provider Notes (Signed)
CSN: [id number]     Arrival date & time 10/02/15  1601 History   First MD Initiated Contact with Patient 10/02/15 1644     Chief Complaint  Patient presents with  . Breast Pain     (Consider location/radiation/quality/duration/timing/severity/associated sxs/prior Treatment) The history is provided by the patient.   Shari Collins is a 40 y.o. female with a history of DM, todays home cbg measuring 234, presenting for evaluation of persistent left breast pain, swelling, redness and hardness associated with cellulitis which has worsened despite a course of dicloxacillin, switched to doxycycline when the infection was not responding to the initial antibiotic, currently on day 9 of doxy.  She denies nipple discharge, although she initially had green nipple discharge from both breasts per the chart.  She endorses nausea without emesis and subjective fevers since last night.  She has 2 chronic left breast masses which were felt to be benign based on q 6 months [place], last exam occuring in 2014.  At that time it was suggested she continue getting repeat US every 6 months but this has not happened.     Past Medical History  Diagnosis Date  . Diabetes mellitus   . Asthma   . Depression    Past Surgical History  Procedure Laterality Date  . Cesarean section    . Tonsillectomy     History reviewed. No pertinent family history. Social History  Substance Use Topics  . Smoking status: Current Every Day Smoker -- 0.50 packs/day    Types: Cigarettes  . Smokeless tobacco: None  . Alcohol Use: No   OB History    Gravida Para Term Preterm AB TAB SAB Ectopic Multiple Living   4 1 1  2  2   1      Review of Systems  Constitutional: Negative for fever.  HENT: Negative for congestion and sore throat.   Eyes: Negative.   Respiratory: Negative for chest tightness and shortness of breath.   Cardiovascular: Negative for chest pain.  Gastrointestinal: Negative for nausea and abdominal pain.   Genitourinary: Negative.   Musculoskeletal: Negative for joint swelling, arthralgias and neck pain.  Skin: Positive for color change. Negative for rash.  Neurological: Negative for dizziness, weakness, light-headedness, numbness and headaches.  Psychiatric/Behavioral: Negative.       Allergies  Bee venom; Latex; and Sulfa antibiotics  Home Medications   Prior to Admission medications   Medication Sig Start Date End Date Taking? Authorizing Provider  albuterol (PROVENTIL HFA;VENTOLIN HFA) 108 (90 BASE) MCG/ACT inhaler Inhale 2 puffs into the lungs every 6 (six) hours as needed for wheezing or shortness of breath.    Historical Provider, MD  citalopram (CELEXA) 40 MG tablet Take 40 mg by mouth 2 (two) times daily.     Historical Provider, MD  clonazePAM (KLONOPIN) 0.5 MG tablet Take 0.5 mg by mouth 2 (two) times daily.    Historical Provider, MD  dicloxacillin (DYNAPEN) 500 MG capsule Take 1 capsule (500 mg total) by mouth 4 (four) times daily. 09/12/15   Soyla Dryer, PA-C  doxycycline (VIBRAMYCIN) 100 MG capsule Take 1 capsule (100 mg total) by mouth 2 (two) times daily. Patient not taking: Reported on 09/24/2015 09/21/15   Noemi Chapel, MD  HYDROcodone-acetaminophen (NORCO/VICODIN) 5-325 MG tablet Take 2 tablets by mouth every 4 (four) hours as needed. Patient not taking: Reported on 09/24/2015 09/21/15   Noemi Chapel, MD  ibuprofen (ADVIL,MOTRIN) 200 MG tablet Take 800 mg by mouth every 6 (six) hours as needed  for mild pain.    Historical Provider, MD  insulin aspart (NOVOLOG) 100 UNIT/ML injection Inject 25 Units into the skin 3 (three) times daily with meals.     Historical Provider, MD  Insulin Glargine (LANTUS SOLOSTAR) 100 UNIT/ML SOPN Inject 50 Units into the skin at bedtime.     Historical Provider, MD  naproxen (NAPROSYN) 500 MG tablet Take 1 tablet (500 mg total) by mouth 2 (two) times daily with a meal. Patient not taking: Reported on 09/24/2015 09/21/15   Noemi Chapel, MD   RisperiDONE (RISPERDAL PO) Take 2 mg by mouth 2 (two) times daily.     Historical Provider, MD   BP 144/84 mmHg  Pulse 86  Temp(Src) 98.2 F (36.8 C) (Oral)  Resp 18  Ht 5\' 7"  (1.702 m)  Wt 108.863 kg  BMI 37.58 kg/m2  SpO2 98%  LMP 09/15/2015  Breastfeeding? No Physical Exam  Constitutional: She appears well-developed and well-nourished.  HENT:  Head: Normocephalic and atraumatic.  Eyes: Conjunctivae are normal.  Neck: Normal range of motion.  Cardiovascular: Normal rate, regular rhythm, normal heart sounds and intact distal pulses.   Pulmonary/Chest: Effort normal and breath sounds normal. She has no wheezes.  Abdominal: Soft. Bowel sounds are normal. There is no tenderness.  Genitourinary: There is breast swelling and tenderness. No breast discharge.  Ttp, induration and erythema around the left breast nipple. No fluctuance. Dark macular lesion at nipple.  Musculoskeletal: Normal range of motion.  Neurological: She is alert.  Skin: Skin is warm and dry.  Psychiatric: She has a normal mood and affect.  Nursing note and vitals reviewed.   ED Course  Procedures (including critical care time) Labs Review Labs Reviewed  CBC WITH DIFFERENTIAL/PLATELET - Abnormal; Notable for the following:    Eosinophils Absolute 0.9 (*)    All other components within normal limits  BASIC METABOLIC PANEL - Abnormal; Notable for the following:    Chloride 100 (*)    Glucose, Bld 261 (*)    All other components within normal limits    Imaging Review US Breast Ltd Uni Left Inc Axilla  10/02/2015  CLINICAL DATA:  Cellulitis and acute breast abscess. EXAM: ULTRASOUND OF THE LEFT BREAST COMPARISON:  Left breast ultrasound 08/02/2013 FINDINGS: Static images from targeted left breast ultrasound demonstrate skin thickening of up to 7 mm, breast parenchymal edema and a complex fluid collection measuring 6.2 by 5.8 by 4.7 cm centered in the retroareolar left breast. No associated suspicious in  appearance breast masses seen. IMPRESSION: Sonographic findings consistent with large retroareolar breast abscess, with associated changes of mastitis and cellulitis. RECOMMENDATION: Ultrasound-guided aspiration for microbiology evaluation and/or surgical consult. I have discussed the findings and recommendations with the patient. Results were also provided in writing at the conclusion of the visit. If applicable, a reminder letter will be sent to the patient regarding the next appointment. BI-RADS CATEGORY  2: Benign Finding(s) Electronically Signed   By: Fidela Salisbury M.D.   On: 10/02/2015 17:56   I have personally reviewed and evaluated these images and lab results as part of my medical decision-making.   EKG Interpretation None      MDM   Final diagnoses:  Cellulitis and abscess    Pt discussed with Dr Wyvonnia Dusky who also saw patient and agrees with need for admission for IV abx, surgical eval.  Call placed to Dr Arnoldo Morale, he will consult, seeing patient in am.  Asked for medical admission for DM control.  Call placed to the  Hospitalist group and spoke with Dr. Waldron Labs.  Accepts pt for admission and he will place admission orders.    Evalee Jefferson, PA-C 10/02/15 [place], MD 10/02/15 [phone]

## 2015-10-03 ENCOUNTER — Inpatient Hospital Stay (HOSPITAL_COMMUNITY): Payer: Medicaid Other

## 2015-10-03 LAB — GLUCOSE, CAPILLARY
GLUCOSE-CAPILLARY: 157 mg/dL — AB (ref 65–99)
Glucose-Capillary: 195 mg/dL — ABNORMAL HIGH (ref 65–99)
Glucose-Capillary: 98 mg/dL (ref 65–99)

## 2015-10-03 LAB — BASIC METABOLIC PANEL
ANION GAP: 6 (ref 5–15)
BUN: 10 mg/dL (ref 6–20)
CALCIUM: 8.3 mg/dL — AB (ref 8.9–10.3)
CO2: 27 mmol/L (ref 22–32)
Chloride: 104 mmol/L (ref 101–111)
Creatinine, Ser: 0.48 mg/dL (ref 0.44–1.00)
GFR calc Af Amer: >60 mL/min (ref >60)
GLUCOSE: 179 mg/dL — AB (ref 65–99)
Potassium: 4.2 mmol/L (ref 3.5–5.1)
Sodium: 137 mmol/L (ref 135–145)

## 2015-10-03 LAB — CBC
HCT: 42.2 % (ref 36.0–46.0)
Hemoglobin: 14.3 g/dL (ref 12.0–15.0)
MCH: 30.5 pg (ref 26.0–34.0)
MCHC: 33.9 g/dL (ref 30.0–36.0)
MCV: 90 fL (ref 78.0–100.0)
PLATELETS: 313 10*3/uL (ref 150–400)
RBC: 4.69 MIL/uL (ref 3.87–5.11)
RDW: 13.8 % (ref 11.5–15.5)
WBC: 9.1 10*3/uL (ref 4.0–10.5)

## 2015-10-03 MED ORDER — OXYCODONE HCL 5 MG PO TABS
5.0000 mg | ORAL_TABLET | ORAL | Status: DC | PRN
Start: 2015-10-03 — End: 2015-10-07

## 2015-10-03 MED ORDER — LIDOCAINE HCL (PF) 2 % IJ SOLN
INTRAMUSCULAR | Status: AC
  Filled 2015-10-03: qty 10

## 2015-10-03 MED ORDER — CLINDAMYCIN HCL 300 MG PO CAPS: 300.0000 mg | ORAL_CAPSULE | Freq: Four times a day (QID) | ORAL | Status: DC

## 2015-10-03 NOTE — Consult Note (Signed)
Reason for Consult: Left breast abscess Referring Physician: Hospitalist  Shari Collins is an 40 y.o. female.  HPI: Patient is a 40 year old white female who has been undergoing outpatient treatment for a left breast abscess. She presented emergency room due to increasing pain and nipple swelling. Ultrasound of the left breast revealed a large retroareolar fluid collection in the left breast.  Past Medical History  Diagnosis Date  . Diabetes mellitus   . Asthma   . Depression     Past Surgical History  Procedure Laterality Date  . Cesarean section    . Tonsillectomy      History reviewed. No pertinent family history.  Social History:  reports that she has been smoking Cigarettes.  She has been smoking about 0.50 packs per day. She does not have any smokeless tobacco history on file. She reports that she does not drink alcohol or use illicit drugs.  Allergies:  Allergies  Allergen Reactions  . Bee Venom Anaphylaxis and Swelling  . Latex Itching  . Sulfa Antibiotics Itching and Rash    Medications: I have reviewed the patient's current medications.  Results for orders placed or performed during the hospital encounter of 10/02/15 (from the past 48 hour(s))  CBC with Differential     Status: Abnormal   Collection Time: 10/02/15  5:20 PM  Result Value Ref Range   WBC 10.3 4.0 - 10.5 K/uL   RBC 4.51 3.87 - 5.11 MIL/uL   Hemoglobin 13.9 12.0 - 15.0 g/dL   HCT 40.2 36.0 - 46.0 %   MCV 89.1 78.0 - 100.0 fL   MCH 30.8 26.0 - 34.0 pg   MCHC 34.6 30.0 - 36.0 g/dL   RDW 13.5 11.5 - 15.5 %   Platelets 370 150 - 400 K/uL   Neutrophils Relative % 62 %   Neutro Abs 6.4 1.7 - 7.7 K/uL   Lymphocytes Relative 24 %   Lymphs Abs 2.5 0.7 - 4.0 K/uL   Monocytes Relative 4 %   Monocytes Absolute 0.4 0.1 - 1.0 K/uL   Eosinophils Relative 9 %   Eosinophils Absolute 0.9 (H) 0.0 - 0.7 K/uL   Basophils Relative 1 %   Basophils Absolute 0.1 0.0 - 0.1 K/uL  Basic metabolic panel     Status:  Abnormal   Collection Time: 10/02/15  5:20 PM  Result Value Ref Range   Sodium 135 135 - 145 mmol/L   Potassium 4.4 3.5 - 5.1 mmol/L   Chloride 100 (L) 101 - 111 mmol/L   CO2 29 22 - 32 mmol/L   Glucose, Bld 261 (H) 65 - 99 mg/dL   BUN 12 6 - 20 mg/dL   Creatinine, Ser 0.57 0.44 - 1.00 mg/dL   Calcium 9.1 8.9 - 10.3 mg/dL   GFR calc non Af Amer >60 >60 mL/min   GFR calc Af Amer >60 >60 mL/min    Comment: (NOTE) The eGFR has been calculated using the CKD EPI equation. This calculation has not been validated in all clinical situations. eGFR's persistently <60 mL/min signify possible Chronic Kidney Disease.    Anion gap 6 5 - 15  CBG monitoring, ED     Status: Abnormal   Collection Time: 10/02/15  7:49 PM  Result Value Ref Range   Glucose-Capillary 225 (H) 65 - 99 mg/dL  Glucose, capillary     Status: Abnormal   Collection Time: 10/02/15  8:28 PM  Result Value Ref Range   Glucose-Capillary 240 (H) 65 - 99 mg/dL  Comment 1 Notify RN    Comment 2 Document in Chart   CBC     Status: None   Collection Time: 10/03/15  5:17 AM  Result Value Ref Range   WBC 9.1 4.0 - 10.5 K/uL   RBC 4.69 3.87 - 5.11 MIL/uL   Hemoglobin 14.3 12.0 - 15.0 g/dL   HCT 42.2 36.0 - 46.0 %   MCV 90.0 78.0 - 100.0 fL   MCH 30.5 26.0 - 34.0 pg   MCHC 33.9 30.0 - 36.0 g/dL   RDW 13.8 11.5 - 15.5 %   Platelets 313 150 - 400 K/uL  Basic metabolic panel     Status: Abnormal   Collection Time: 10/03/15  5:17 AM  Result Value Ref Range   Sodium 137 135 - 145 mmol/L   Potassium 4.2 3.5 - 5.1 mmol/L   Chloride 104 101 - 111 mmol/L   CO2 27 22 - 32 mmol/L   Glucose, Bld 179 (H) 65 - 99 mg/dL   BUN 10 6 - 20 mg/dL   Creatinine, Ser 0.48 0.44 - 1.00 mg/dL   Calcium 8.3 (L) 8.9 - 10.3 mg/dL   GFR calc non Af Amer >60 >60 mL/min   GFR calc Af Amer >60 >60 mL/min    Comment: (NOTE) The eGFR has been calculated using the CKD EPI equation. This calculation has not been validated in all clinical  situations. eGFR's persistently <60 mL/min signify possible Chronic Kidney Disease.    Anion gap 6 5 - 15  Glucose, capillary     Status: Abnormal   Collection Time: 10/03/15  7:13 AM  Result Value Ref Range   Glucose-Capillary 195 (H) 65 - 99 mg/dL   Comment 1 Notify RN    Comment 2 Document in Chart     US Breast Ltd Uni Left Inc Axilla  10/02/2015  CLINICAL DATA:  Cellulitis and acute breast abscess. EXAM: ULTRASOUND OF THE LEFT BREAST COMPARISON:  Left breast ultrasound 08/02/2013 FINDINGS: Static images from targeted left breast ultrasound demonstrate skin thickening of up to 7 mm, breast parenchymal edema and a complex fluid collection measuring 6.2 by 5.8 by 4.7 cm centered in the retroareolar left breast. No associated suspicious in appearance breast masses seen. IMPRESSION: Sonographic findings consistent with large retroareolar breast abscess, with associated changes of mastitis and cellulitis. RECOMMENDATION: Ultrasound-guided aspiration for microbiology evaluation and/or surgical consult. I have discussed the findings and recommendations with the patient. Results were also provided in writing at the conclusion of the visit. If applicable, a reminder letter will be sent to the patient regarding the next appointment. BI-RADS CATEGORY  2: Benign Finding(s) Electronically Signed   By: Fidela Salisbury M.D.   On: 10/02/2015 17:56    ROS: See chart Blood pressure 131/71, pulse 68, temperature 98.2 F (36.8 C), temperature source Oral, resp. rate 18, height '5\' 7"'  (1.702 m), weight 100.3 kg (221 lb 1.9 oz), last menstrual period 09/15/2015, SpO2 97 %, not currently breastfeeding. Physical Exam: Pleasant white female in no acute distress. Left breast examination reveals a large fluctuant collection behind the left nipple. There is some excoriation of the left nipple with drainage. No adenopathy noted.  Assessment/Plan: Impression: Left breast fluid collection, most likely abscess,  though patient has no leukocytosis Plan: I offered surgical incision and drainage, but patient would prefer ultrasound-guided drainage. This will be performed today. Further management pending those results.  Shari Collins A 10/03/2015, 8:07 AM

## 2015-10-03 NOTE — Progress Notes (Signed)
Patient alert and oriented, independent, VSS, pt. Tolerating diet well. No complaints of pain or nausea. Pt. Had IV removed tip intact. Pt. Had prescriptions given. Pt. Voiced understanding of discharge instructions with no further questions. Pt. Discharged via wheelchair with nurse tech.  

## 2015-10-03 NOTE — Procedures (Signed)
PreOp Dx:  LT breast abscess PostOp Dx:  LT breast abscess Procedure:  US guided aspiration of LT breast abscess Radiologist:  Thornton Papas Anesthesia:  4 ml of 2% lidocaine Specimen:  60 ml of grossly purulent fluid EBL:   < 1 ml Complications: None

## 2015-10-03 NOTE — Care Management Note (Signed)
Case Management Note  Patient Details  Name: DASHONDA PIASCIK MRN: [id number] Date of Birth: [birth year]  Subjective/Objective:                  Admitted with breast abscess. Pt is from home, lives with her aunt and is ind with ADL's. 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 Pt is uninsured and goes to the [employer] [facility] for PCP care. Pt concerned about financial responsibility for his hospitalization and anxious to go home. Pt referred to [facility] to discuss assistance with hospital bill and f/u care if specialist needed. ? [place] voucher at DC.   Action/Plan: Will cont to follow for DC planning needs.   Expected Discharge Date:      10/05/2015            Expected Discharge Plan:  Home/Self Care  In-House Referral:  Financial Counselor  Discharge planning Services  CM Consult  Post Acute Care Choice:  NA Choice offered to:  NA  DME Arranged:    DME Agency:     HH Arranged:    HH Agency:     Status of Service:  In process, will continue to follow  Medicare Important Message Given:    Date Medicare IM Given:    Medicare IM give by:    Date Additional Medicare IM Given:    Additional Medicare Important Message give by:     If discussed at [place] of Stay Meetings, dates discussed:    Additional Comments:  Sherald Barge, RN 10/03/2015, 3:40 PM

## 2015-10-03 NOTE — Discharge Summary (Signed)
Physician Discharge Summary  Shari Collins [id number] DOB: 31-Dec-1975 DOA: 10/02/2015  PCP: Soyla Dryer, PA-C  Admit date: 10/02/2015 Discharge date: 10/03/2015  Time spent: 40 minutes  Recommendations for Outpatient Follow-up:  1. Follow up with Dr. Arnoldo Morale as needed 2. Follow up with primary care physician in 2 weeks. Consider mammogram/breast ultrasound of right breast in the next 3-4 weeks to evaluate for underlying malignancy   Discharge Diagnoses:  Active Problems:   Uncontrolled type 2 diabetes mellitus with complication (HCC)   Breast abscess   Schizophrenia ([place])   Bipolar disorder, unspecified ([place])   Discharge Condition: improved  Diet recommendation: low carb  Filed Weights   10/02/15 1612 10/02/15 2026  Weight: 108.863 kg (240 lb) 100.3 kg (221 lb 1.9 oz)    History of present illness and hospital course:  This patient presented to the hospital with complaints of left breast pain. She reports having pain/erythema/cellulitis for approximately 1 month. She was treated with dicloxacillin and doxycycline without significant improvement. She denies any nipple discharge. She did not have any fever or chills. Ultrasound of the breast was done that showed a 6cm abscess. She was admitted to the hospital and started on IV antibiotics. She was seen by Dr. Arnoldo Morale who offered a surgical incision and drainage, but patient elected to have ultrasound guided drainage. After procedure she felt significantly improved, reports improvement of pain. She was adamant about being discharged home the same day. Discussed with Dr. Arnoldo Morale who agreed with discharge and was agreeable to follow up the patient as needed. She was transitioned to a course of clindamycin. Specimen from drainage has been sent for cytology and culture. Patient was advised to follow up with primary care physician and consider repeat ultrasound/mammogram in 3-4 weeks to ensure resolution and evaluate for underlying  malignancy.  She was advised to return to ED if she had high fevers, worsening of pain or swelling or erythema.   Procedures:  Ultrasound guided drainage of abscess  Consultations:  Gen Surgery  Discharge Exam: Filed Vitals:   10/03/15 0605 10/03/15 1503  BP: 131/71 122/95  Pulse: 68 71  Temp: 98.2 F (36.8 C) 98 F (36.7 C)  Resp: 18 18    General: NAD Cardiovascular: S1, S2, RRR Respiratory: CTA B  Discharge Instructions   Discharge Instructions    Diet - low sodium heart healthy    Complete by:  As directed      Increase activity slowly    Complete by:  As directed           Current Discharge Medication List    START taking these medications   Details  clindamycin (CLEOCIN) 300 MG capsule Take 1 capsule (300 mg total) by mouth 4 (four) times daily. Qty: 28 capsule, Refills: 0    oxyCODONE (OXY IR/ROXICODONE) 5 MG immediate release tablet Take 1 tablet (5 mg total) by mouth every 4 (four) hours as needed for moderate pain. Qty: 15 tablet, Refills: 0      CONTINUE these medications which have NOT CHANGED   Details  citalopram (CELEXA) 40 MG tablet Take 40 mg by mouth 2 (two) times daily.     clonazePAM (KLONOPIN) 0.5 MG tablet Take 0.5 mg by mouth 2 (two) times daily.    insulin aspart (NOVOLOG) 100 UNIT/ML injection Inject 25 Units into the skin 3 (three) times daily with meals.     Insulin Glargine (LANTUS SOLOSTAR) 100 UNIT/ML SOPN Inject 50 Units into the skin at bedtime.  naproxen (NAPROSYN) 500 MG tablet Take 1 tablet (500 mg total) by mouth 2 (two) times daily with a meal. Qty: 30 tablet, Refills: 0    RisperiDONE (RISPERDAL PO) Take 2 mg by mouth 2 (two) times daily.     albuterol (PROVENTIL HFA;VENTOLIN HFA) 108 (90 BASE) MCG/ACT inhaler Inhale 2 puffs into the lungs every 6 (six) hours as needed for wheezing or shortness of breath.      STOP taking these medications     dicloxacillin (DYNAPEN) 500 MG capsule      ibuprofen  (ADVIL,MOTRIN) 200 MG tablet      doxycycline (VIBRAMYCIN) 100 MG capsule      HYDROcodone-acetaminophen (NORCO/VICODIN) 5-325 MG tablet        Allergies  Allergen Reactions  . Bee Venom Anaphylaxis and Swelling  . Latex Itching  . Sulfa Antibiotics Itching and Rash   Follow-up Information    Follow up with Jamesetta So, MD.   Specialty:  General Surgery   Why:  As needed   Contact information:   [address] [facility] [id number] [phone]        The results of significant diagnostics from this hospitalization (including imaging, microbiology, ancillary and laboratory) are listed below for reference.    Significant Diagnostic Studies: US Guided Needle Placement  10/03/2015  CLINICAL DATA:  LEFT breast abscess, treated for 1 month without resolution, diabetes mellitus, smoker, asthma EXAM: ULTRASOUND GUIDED LEFT BREAST ABSCESS ASPIRATION COMPARISON:  Ultrasound LEFT breast 10/02/2015 PROCEDURE: Procedure, benefits and risks were discussed with the patient. Written informed consent for ultrasound-guided LEFT breast abscess aspiration was obtained. Timeout protocol followed. Skin prepped and draped in usual sterile fashion. Local anesthesia with 4 mL of 2% lidocaine. Under direct sonographic visualization, an 18 gauge spinal needle was advanced into the large retroareolar abscess collection which measured 6.2 x 5.8 x 4.7 cm on the preceding ultrasound. 60 cc of grossly purulent material was aspirated without immediate complication. Procedure tolerated well by patient. Fluid sent to laboratory for requested analysis. IMPRESSION: Ultrasound-guided aspiration of LEFT breast abscess. No apparent complications. RECOMMENDATIONS: Perform sonographic imaging and ultrasound-guided aspiration do not constitute inadequate assessment for detection of breast cancer. Followup clinical, mammographic and sonographic evaluation is recommended to exclude breast cancer. Electronically Signed    By: Lavonia Dana M.D.   On: 10/03/2015 15:46   US Aspiration  10/03/2015  CLINICAL DATA:  LEFT breast abscess, treated for 1 month without resolution, diabetes mellitus, smoker, asthma EXAM: ULTRASOUND GUIDED LEFT BREAST ABSCESS ASPIRATION COMPARISON:  Ultrasound LEFT breast 10/02/2015 PROCEDURE: Procedure, benefits and risks were discussed with the patient. Written informed consent for ultrasound-guided LEFT breast abscess aspiration was obtained. Timeout protocol followed. Skin prepped and draped in usual sterile fashion. Local anesthesia with 4 mL of 2% lidocaine. Under direct sonographic visualization, an 18 gauge spinal needle was advanced into the large retroareolar abscess collection which measured 6.2 x 5.8 x 4.7 cm on the preceding ultrasound. 60 cc of grossly purulent material was aspirated without immediate complication. Procedure tolerated well by patient. Fluid sent to laboratory for requested analysis. IMPRESSION: Ultrasound-guided aspiration of LEFT breast abscess. No apparent complications. RECOMMENDATIONS: Perform sonographic imaging and ultrasound-guided aspiration do not constitute inadequate assessment for detection of breast cancer. Followup clinical, mammographic and sonographic evaluation is recommended to exclude breast cancer. Electronically Signed   By: Lavonia Dana M.D.   On: 10/03/2015 15:46   US Breast Ltd Uni Left Inc Axilla  10/02/2015  CLINICAL DATA:  Cellulitis and acute  breast abscess. EXAM: ULTRASOUND OF THE LEFT BREAST COMPARISON:  Left breast ultrasound 08/02/2013 FINDINGS: Static images from targeted left breast ultrasound demonstrate skin thickening of up to 7 mm, breast parenchymal edema and a complex fluid collection measuring 6.2 by 5.8 by 4.7 cm centered in the retroareolar left breast. No associated suspicious in appearance breast masses seen. IMPRESSION: Sonographic findings consistent with large retroareolar breast abscess, with associated changes of mastitis and  cellulitis. RECOMMENDATION: Ultrasound-guided aspiration for microbiology evaluation and/or surgical consult. I have discussed the findings and recommendations with the patient. Results were also provided in writing at the conclusion of the visit. If applicable, a reminder letter will be sent to the patient regarding the next appointment. BI-RADS CATEGORY  2: Benign Finding(s) Electronically Signed   By: Fidela Salisbury M.D.   On: 10/02/2015 17:56    Microbiology: No results found for this or any previous visit (from the past 240 hour(s)).   Labs: Basic Metabolic Panel:  Recent Labs Lab 10/02/15 1720 10/03/15 0517  NA 135 137  K 4.4 4.2  CL 100* 104  CO2 29 27  GLUCOSE 261* 179*  BUN 12 10  CREATININE 0.57 0.48  CALCIUM 9.1 8.3*   Liver Function Tests: No results for input(s): AST, ALT, ALKPHOS, BILITOT, PROT, ALBUMIN in the last 168 hours. No results for input(s): LIPASE, AMYLASE in the last 168 hours. No results for input(s): AMMONIA in the last 168 hours. CBC:  Recent Labs Lab 10/02/15 1720 10/03/15 0517  WBC 10.3 9.1  NEUTROABS 6.4  --   HGB 13.9 14.3  HCT 40.2 42.2  MCV 89.1 90.0  PLT 370 313   Cardiac Enzymes: No results for input(s): CKTOTAL, CKMB, CKMBINDEX, TROPONINI in the last 168 hours. BNP: BNP (last 3 results) No results for input(s): BNP in the last 8760 hours.  ProBNP (last 3 results) No results for input(s): PROBNP in the last 8760 hours.  CBG:  Recent Labs Lab 10/02/15 1949 10/02/15 2028 10/03/15 0713 10/03/15 1129 10/03/15 1635  GLUCAP 225* 240* 195* 98 157*       Signed:  Chynah Orihuela MD.  [facility] 10/03/2015, 6:58 PM

## 2015-10-04 LAB — HEMOGLOBIN A1C
HEMOGLOBIN A1C: 10.5 % — AB (ref 4.8–5.6)
Mean Plasma Glucose: 255 mg/dL

## 2015-10-04 NOTE — Progress Notes (Signed)
Pt discharged last night with Rx for abx. Pt unable to afford abx, pt is eligible for [facility] voucher. MATCH given.

## 2015-10-06 LAB — CULTURE, ROUTINE-ABSCESS: CULTURE: NORMAL

## 2015-10-07 ENCOUNTER — Ambulatory Visit: Payer: Self-pay | Admitting: Physician Assistant

## 2015-10-07 ENCOUNTER — Encounter: Payer: Self-pay | Admitting: Physician Assistant

## 2015-10-07 VITALS — BP 130/78 | HR 85 | Temp 97.9°F | Ht 66.0 in | Wt 219.8 lb

## 2015-10-07 DIAGNOSIS — N611 Abscess of the breast and nipple: Secondary | ICD-10-CM

## 2015-10-07 DIAGNOSIS — Z91199 Patient's noncompliance with other medical treatment and regimen due to unspecified reason: Secondary | ICD-10-CM

## 2015-10-07 DIAGNOSIS — E1165 Type 2 diabetes mellitus with hyperglycemia: Secondary | ICD-10-CM

## 2015-10-07 DIAGNOSIS — E118 Type 2 diabetes mellitus with unspecified complications: Principal | ICD-10-CM

## 2015-10-07 DIAGNOSIS — IMO0002 Reserved for concepts with insufficient information to code with codable children: Secondary | ICD-10-CM

## 2015-10-07 DIAGNOSIS — Z9119 Patient's noncompliance with other medical treatment and regimen: Secondary | ICD-10-CM

## 2015-10-07 DIAGNOSIS — E785 Hyperlipidemia, unspecified: Secondary | ICD-10-CM | POA: Insufficient documentation

## 2015-10-07 DIAGNOSIS — Z794 Long term (current) use of insulin: Principal | ICD-10-CM

## 2015-10-07 NOTE — Progress Notes (Signed)
BP 130/78 mmHg  Pulse 85  Temp(Src) 97.9 F (36.6 C)  Ht 5\' 6"  (1.676 m)  Wt 219 lb 12.8 oz (99.701 kg)  BMI 35.49 kg/m2  SpO2 96%  LMP 09/15/2015   Subjective:    Patient ID: Shari Collins, female    DOB: April 29, 1976, 40 y.o.   MRN: [id number]  HPI: Shari Collins is a 40 y.o. female presenting on 10/07/2015 for Breast Problem   HPI Chief Complaint  Patient presents with  . Breast Problem    mass was removed at [facility].   Pt states she spent one night in the hospital and breast is all better.  Pt is going to [facility].  Pt had a1c checked in [facility] on 10/02/15- was 10.5  Pt states she only eats once/day.  Relevant past medical, surgical, family and social history reviewed and updated as indicated. Interim medical history since our last visit reviewed. Allergies and medications reviewed and updated.  Current outpatient prescriptions:  .  albuterol (PROVENTIL HFA;VENTOLIN HFA) 108 (90 BASE) MCG/ACT inhaler, Inhale 2 puffs into the lungs every 6 (six) hours as needed for wheezing or shortness of breath., Disp: , Rfl:  .  citalopram (CELEXA) 40 MG tablet, Take 40 mg by mouth 2 (two) times daily. , Disp: , Rfl:  .  clindamycin (CLEOCIN) 300 MG capsule, Take 1 capsule (300 mg total) by mouth 4 (four) times daily., Disp: 28 capsule, Rfl: 0 .  clonazePAM (KLONOPIN) 0.5 MG tablet, Take 0.5 mg by mouth 2 (two) times daily., Disp: , Rfl:  .  insulin aspart (NOVOLOG) 100 UNIT/ML injection, Inject 25 Units into the skin 3 (three) times daily with meals. , Disp: , Rfl:  .  Insulin Glargine (LANTUS SOLOSTAR) 100 UNIT/ML SOPN, Inject 50 Units into the skin at bedtime. , Disp: , Rfl:  .  naproxen (NAPROSYN) 500 MG tablet, Take 1 tablet (500 mg total) by mouth 2 (two) times daily with a meal., Disp: 30 tablet, Rfl: 0 .  RisperiDONE (RISPERDAL PO), Take 2 mg by mouth 2 (two) times daily. , Disp: , Rfl:  .  sitaGLIPtin-metformin (JANUMET) 50-500 MG tablet, Take 1 tablet by mouth 2  (two) times daily with a meal., Disp: , Rfl:    Review of Systems  Constitutional: Negative for fever, chills, diaphoresis, appetite change, fatigue and unexpected weight change.  HENT: Negative for congestion, dental problem, drooling, ear pain, facial swelling, hearing loss, mouth sores, sneezing, sore throat, trouble swallowing and voice change.   Eyes: Negative for pain, discharge, redness, itching and visual disturbance.  Respiratory: Negative for cough, choking, shortness of breath and wheezing.   Cardiovascular: Negative for chest pain, palpitations and leg swelling.  Gastrointestinal: Negative for vomiting, abdominal pain, diarrhea, constipation and blood in stool.  Endocrine: Negative for cold intolerance, heat intolerance and polydipsia.  Genitourinary: Negative for dysuria, hematuria and decreased urine volume.  Musculoskeletal: Negative for back pain, arthralgias and gait problem.  Skin: Negative for rash.  Allergic/Immunologic: Negative for environmental allergies.  Neurological: Negative for seizures, syncope, light-headedness and headaches.  Hematological: Negative for adenopathy.  Psychiatric/Behavioral: Negative for suicidal ideas, dysphoric mood and agitation. The patient is not nervous/anxious.     Per HPI unless specifically indicated above     Objective:    BP 130/78 mmHg  Pulse 85  Temp(Src) 97.9 F (36.6 C)  Ht 5\' 6"  (1.676 m)  Wt 219 lb 12.8 oz (99.701 kg)  BMI 35.49 kg/m2  SpO2 96%  LMP  09/15/2015  Wt Readings from Last 3 Encounters:  10/07/15 219 lb 12.8 oz (99.701 kg)  10/02/15 221 lb 1.9 oz (100.3 kg)  09/24/15 224 lb 9.6 oz (101.878 kg)    Physical Exam  Constitutional: She is oriented to person, place, and time. She appears well-developed and well-nourished.  HENT:  Head: Normocephalic and atraumatic.  Neck: Neck supple.  Cardiovascular: Normal rate and regular rhythm.   Pulmonary/Chest: Effort normal and breath sounds normal.  Abdominal:  Soft. Bowel sounds are normal. She exhibits no mass. There is no tenderness.  Genitourinary:  L breast with small area of firmness.  No tenderness, no discharge, no fluctuance.  Lymphadenopathy:    She has no cervical adenopathy.  Neurological: She is alert and oriented to person, place, and time.  Skin: Skin is warm and dry.  Psychiatric: She has a normal mood and affect. Her behavior is normal.  Vitals reviewed.       Assessment & Plan:   Encounter Diagnoses  Name Primary?  Marland Kitchen Uncontrolled type 2 diabetes mellitus with complication, with long-term current use of insulin ([place]) Yes  . Hyperlipidemia   . Personal history of noncompliance with medical treatment, presenting hazards to health   . Breast abscess     -counseled pt at length about her history of noncompliance and the detrimental effects it has on her health. She says she is ready to take care of herself.  Reviewed with pt that she has missed 2 appointments already so she needs to avoid missing another or she will be dismissed per clinic policy. She states understanding.  -Check fasting lipids -order Dm eye exam -recommend pt get Pap [facility] next month during free pap screening clinic -pt to Check bs and bring log to f/u OV in one week. -Plan on mammo in about a month after the remaining infection and tenderness clears.  Pt states gratitude for the month prior to rechecking

## 2015-10-08 LAB — ANAEROBIC CULTURE

## 2015-10-09 LAB — COMPLETE METABOLIC PANEL WITH GFR
ALBUMIN: 3.9 g/dL (ref 3.6–5.1)
ALK PHOS: 86 U/L (ref 33–115)
ALT: 13 U/L (ref 6–29)
AST: 14 U/L (ref 10–30)
BUN: 12 mg/dL (ref 7–25)
CO2: 24 mmol/L (ref 20–31)
Calcium: 9.1 mg/dL (ref 8.6–10.2)
Chloride: 96 mmol/L — ABNORMAL LOW (ref 98–110)
Creat: 0.73 mg/dL (ref 0.50–1.10)
GFR, Est African American: >89 mL/min (ref >=60)
GFR, Est Non African American: >89 mL/min (ref >=60)
GLUCOSE: 290 mg/dL — AB (ref 65–99)
POTASSIUM: 5.1 mmol/L (ref 3.5–5.3)
SODIUM: 132 mmol/L — AB (ref 135–146)
TOTAL PROTEIN: 7 g/dL (ref 6.1–8.1)
Total Bilirubin: 0.6 mg/dL (ref 0.2–1.2)

## 2015-10-09 LAB — LIPID PANEL
CHOL/HDL RATIO: 8.4 ratio — AB (ref <=5.0)
Cholesterol: 236 mg/dL — ABNORMAL HIGH (ref 125–200)
HDL: 28 mg/dL — ABNORMAL LOW (ref >=46)
Triglycerides: 806 mg/dL — ABNORMAL HIGH (ref <150)

## 2015-10-14 ENCOUNTER — Ambulatory Visit: Payer: Self-pay | Admitting: Physician Assistant

## 2015-10-16 ENCOUNTER — Encounter: Payer: Self-pay | Admitting: Physician Assistant

## 2016-04-27 ENCOUNTER — Encounter (HOSPITAL_COMMUNITY): Payer: Self-pay | Admitting: Emergency Medicine

## 2016-04-27 ENCOUNTER — Emergency Department (HOSPITAL_COMMUNITY)
Admission: EM | Admit: 2016-04-27 | Discharge: 2016-04-27 | Disposition: A | Discharge status: Home / Self Care | Payer: Medicaid Other | Attending: Emergency Medicine | Admitting: Emergency Medicine

## 2016-04-27 DIAGNOSIS — J45909 Unspecified asthma, uncomplicated: Secondary | ICD-10-CM | POA: Insufficient documentation

## 2016-04-27 DIAGNOSIS — E119 Type 2 diabetes mellitus without complications: Secondary | ICD-10-CM | POA: Insufficient documentation

## 2016-04-27 DIAGNOSIS — Z794 Long term (current) use of insulin: Secondary | ICD-10-CM | POA: Insufficient documentation

## 2016-04-27 DIAGNOSIS — N6310 Unspecified lump in the right breast, unspecified quadrant: Secondary | ICD-10-CM

## 2016-04-27 DIAGNOSIS — N63 Unspecified lump in breast: Secondary | ICD-10-CM | POA: Insufficient documentation

## 2016-04-27 DIAGNOSIS — F17210 Nicotine dependence, cigarettes, uncomplicated: Secondary | ICD-10-CM | POA: Insufficient documentation

## 2016-04-27 DIAGNOSIS — Z79899 Other long term (current) drug therapy: Secondary | ICD-10-CM | POA: Insufficient documentation

## 2016-04-27 HISTORY — DX: Patient's noncompliance with other medical treatment and regimen due to unspecified reason: Z91.199

## 2016-04-27 HISTORY — DX: Patient's noncompliance with other medical treatment and regimen: Z91.19

## 2016-04-27 MED ORDER — IBUPROFEN 200 MG PO TABS: 600.0000 mg | ORAL_TABLET | Freq: Four times a day (QID) | ORAL | 0 refills | Status: DC | PRN

## 2016-04-27 MED ORDER — TRAMADOL HCL 50 MG PO TABS
50.0000 mg | ORAL_TABLET | Freq: Four times a day (QID) | ORAL | 0 refills | Status: DC | PRN
Start: 2016-04-27 — End: 2016-08-20

## 2016-04-27 MED ORDER — CEPHALEXIN 500 MG PO CAPS: 500.0000 mg | ORAL_CAPSULE | Freq: Four times a day (QID) | ORAL | 0 refills | Status: DC

## 2016-04-27 NOTE — ED Notes (Addendum)
MD Kohut at bedside. 

## 2016-04-27 NOTE — ED Provider Notes (Signed)
[facility] DEPT Provider Note   CSN: [id number] Arrival date & time: 04/27/16  1111     History   Chief Complaint Chief Complaint  Patient presents with  . Breast Problem    HPI Shari Collins is a 40 y.o. female.  HPI   40 year old female with painful lesion to her right breast. First noticed just over a week ago. Progressively becoming more painful and larger in size. She has noticed some whitish/green discharge from her nipple. No fevers or chills. Does have a past history of breast abscess to her left side requiring needle aspiration in January of this year. She is a diabetic.  Past Medical History:  Diagnosis Date  . Asthma   . Depression   . Diabetes mellitus   . Noncompliance     Patient Active Problem List   Diagnosis Date Noted  . Hyperlipidemia 10/07/2015  . Breast abscess 10/02/2015  . Schizophrenia ([place]) 10/02/2015  . Bipolar disorder, unspecified ([place]) 10/02/2015  . Uncontrolled type 2 diabetes mellitus with complication ([place]) [id number]  . Personal history of noncompliance with medical treatment, presenting hazards to health 09/24/2015    Past Surgical History:  Procedure Laterality Date  . CESAREAN SECTION    . TONSILLECTOMY      OB History    Gravida Para Term Preterm AB Living   4 1 1   2 1    SAB TAB Ectopic Multiple Live Births   2               Home Medications    Prior to Admission medications   Medication Sig Start Date End Date Taking? Authorizing Provider  albuterol (PROVENTIL HFA;VENTOLIN HFA) 108 (90 BASE) MCG/ACT inhaler Inhale 2 puffs into the lungs every 6 (six) hours as needed for wheezing or shortness of breath.    Historical Provider, MD  citalopram (CELEXA) 40 MG tablet Take 40 mg by mouth 2 (two) times daily.     Historical Provider, MD  clindamycin (CLEOCIN) 300 MG capsule Take 1 capsule (300 mg total) by mouth 4 (four) times daily. 10/03/15   Kathie Dike, MD  clonazePAM (KLONOPIN) 0.5 MG tablet Take 0.5 mg by  mouth 2 (two) times daily.    Historical Provider, MD  insulin aspart (NOVOLOG) 100 UNIT/ML injection Inject 25 Units into the skin 3 (three) times daily with meals.     Historical Provider, MD  Insulin Glargine (LANTUS SOLOSTAR) 100 UNIT/ML SOPN Inject 50 Units into the skin at bedtime.     Historical Provider, MD  naproxen (NAPROSYN) 500 MG tablet Take 1 tablet (500 mg total) by mouth 2 (two) times daily with a meal. 09/21/15   Noemi Chapel, MD  RisperiDONE (RISPERDAL PO) Take 2 mg by mouth 2 (two) times daily.     Historical Provider, MD  sitaGLIPtin-metformin (JANUMET) 50-500 MG tablet Take 1 tablet by mouth 2 (two) times daily with a meal.    Historical Provider, MD    Family History History reviewed. No pertinent family history.  Social History Social History  Substance Use Topics  . Smoking status: Current Every Day Smoker    Packs/day: 0.50    Types: Cigarettes  . Smokeless tobacco: Never Used  . Alcohol use No     Allergies   Bee venom; Latex; and Sulfa antibiotics   Review of Systems Review of Systems   All systems reviewed and negative, other than as noted in HPI.    Physical Exam Updated Vital Signs BP 145/81 (BP Location: Right  Arm)   Pulse 77   Temp 99.2 F (37.3 C) (Oral)   Resp 16   Ht 5\' 6"  (1.676 m)   Wt 220 lb (99.8 kg)   LMP 04/20/2016   SpO2 100%   BMI 35.51 kg/m   Physical Exam  Constitutional: She appears well-developed and well-nourished. No distress.  HENT:  Head: Normocephalic and atraumatic.  Eyes: Conjunctivae are normal.  Neck: Neck supple.  Cardiovascular: Normal rate and regular rhythm.   No murmur heard. Pulmonary/Chest: Effort normal and breath sounds normal. No respiratory distress.  Abdominal: Soft. There is no tenderness.  Musculoskeletal: She exhibits no edema.  Neurological: She is alert.  Skin: Skin is warm and dry.  Chaperone present. Just behind the right nipple/areola there is a firm ovoid mass. Roughly 3 x 2 x 2 cm.  Tender. Overlying skin is fairly normal in appearance. No appreciable erythema. No skin thickening. No drainage. No other concerning masses palpated. No axillary adenopathy.  Psychiatric: She has a normal mood and affect.  Nursing note and vitals reviewed.    ED Treatments / Results  Labs (all labs ordered are listed, but only abnormal results are displayed) Labs Reviewed - No data to display  EKG  EKG Interpretation None       Radiology No results found.  Procedures Procedures (including critical care time)  Medications Ordered in ED Medications - No data to display   Initial Impression / Assessment and Plan / ED Course  I have reviewed the triage vital signs and the nursing notes.  Pertinent labs & imaging results that were available during my care of the patient were reviewed by me and considered in my medical decision making (see chart for details).  Clinical Course    40 year old female with right breast mass. We will cover for possible infectious etiology. This will need image. At this point, I do not feel that she requires hospitalization. Will place on antibiotics for possible abscess. As needed pain medication. We'll give information for follow-[facility] for both [facility] in [place] as well as Dr. Arnoldo Morale locally.  Final Clinical Impressions(s) / ED Diagnoses   Final diagnoses:  Breast mass, right    New Prescriptions New Prescriptions   No medications on file     Virgel Manifold, MD 04/27/16 1230

## 2016-04-27 NOTE — ED Notes (Signed)
States right breast "abscess" under nipple. Reports "green" drainage from nipple when she "squeezes" area. States chills, N/.

## 2016-04-27 NOTE — ED Triage Notes (Signed)
Patient complaining of "abscess" to right breast x 1 week. States she had same to left breast in February and had to be admitted to have it treated.

## 2016-08-20 ENCOUNTER — Encounter (HOSPITAL_COMMUNITY): Payer: Self-pay

## 2016-08-20 ENCOUNTER — Emergency Department (HOSPITAL_COMMUNITY)
Admission: EM | Admit: 2016-08-20 | Discharge: 2016-08-20 | Disposition: A | Discharge status: Home / Self Care | Payer: Medicaid Other | Attending: Emergency Medicine | Admitting: Emergency Medicine

## 2016-08-20 ENCOUNTER — Emergency Department (HOSPITAL_COMMUNITY): Payer: Medicaid Other

## 2016-08-20 DIAGNOSIS — Z91040 Latex allergy status: Secondary | ICD-10-CM | POA: Insufficient documentation

## 2016-08-20 DIAGNOSIS — F17210 Nicotine dependence, cigarettes, uncomplicated: Secondary | ICD-10-CM | POA: Insufficient documentation

## 2016-08-20 DIAGNOSIS — R002 Palpitations: Secondary | ICD-10-CM | POA: Insufficient documentation

## 2016-08-20 DIAGNOSIS — J45909 Unspecified asthma, uncomplicated: Secondary | ICD-10-CM | POA: Insufficient documentation

## 2016-08-20 DIAGNOSIS — Z79899 Other long term (current) drug therapy: Secondary | ICD-10-CM | POA: Insufficient documentation

## 2016-08-20 DIAGNOSIS — M542 Cervicalgia: Secondary | ICD-10-CM | POA: Insufficient documentation

## 2016-08-20 DIAGNOSIS — R197 Diarrhea, unspecified: Secondary | ICD-10-CM | POA: Insufficient documentation

## 2016-08-20 DIAGNOSIS — Z794 Long term (current) use of insulin: Secondary | ICD-10-CM | POA: Insufficient documentation

## 2016-08-20 DIAGNOSIS — E119 Type 2 diabetes mellitus without complications: Secondary | ICD-10-CM | POA: Insufficient documentation

## 2016-08-20 LAB — BASIC METABOLIC PANEL
Anion gap: 10 (ref 5–15)
BUN: 10 mg/dL (ref 6–20)
CHLORIDE: 99 mmol/L — AB (ref 101–111)
CO2: 22 mmol/L (ref 22–32)
Calcium: 8.8 mg/dL — ABNORMAL LOW (ref 8.9–10.3)
Creatinine, Ser: 0.57 mg/dL (ref 0.44–1.00)
GFR calc non Af Amer: >60 mL/min (ref >60)
Glucose, Bld: 207 mg/dL — ABNORMAL HIGH (ref 65–99)
POTASSIUM: 3.7 mmol/L (ref 3.5–5.1)
SODIUM: 131 mmol/L — AB (ref 135–145)

## 2016-08-20 LAB — CBC WITH DIFFERENTIAL/PLATELET
BASOS PCT: 1 %
Basophils Absolute: 0.1 10*3/uL (ref 0.0–0.1)
EOS ABS: 1 10*3/uL — AB (ref 0.0–0.7)
Eosinophils Relative: 8 %
HCT: 43.2 % (ref 36.0–46.0)
HEMOGLOBIN: 15.1 g/dL — AB (ref 12.0–15.0)
LYMPHS ABS: 3 10*3/uL (ref 0.7–4.0)
Lymphocytes Relative: 26 %
MCH: 31.3 pg (ref 26.0–34.0)
MCHC: 35 g/dL (ref 30.0–36.0)
MCV: 89.6 fL (ref 78.0–100.0)
Monocytes Absolute: 0.5 10*3/uL (ref 0.1–1.0)
Monocytes Relative: 5 %
NEUTROS PCT: 60 %
Neutro Abs: 7.1 10*3/uL (ref 1.7–7.7)
Platelets: 291 10*3/uL (ref 150–400)
RBC: 4.82 MIL/uL (ref 3.87–5.11)
RDW: 13.6 % (ref 11.5–15.5)
WBC: 11.8 10*3/uL — AB (ref 4.0–10.5)

## 2016-08-20 LAB — TROPONIN I

## 2016-08-20 LAB — CBG MONITORING, ED: GLUCOSE-CAPILLARY: 214 mg/dL — AB (ref 65–99)

## 2016-08-20 NOTE — ED Notes (Signed)
Pt taken to Xray.

## 2016-08-20 NOTE — ED Notes (Signed)
Pt back from x-ray.

## 2016-08-20 NOTE — ED Triage Notes (Signed)
Pt says has been feeling "fluttery" feeling in chest for the past year.  Reports last night she started having pain in r arm and chest feels "tired."  Denies sob, n/v but reports dizziness.

## 2016-08-20 NOTE — Discharge Instructions (Signed)

## 2016-08-20 NOTE — ED Provider Notes (Signed)
[facility] DEPT Provider Note   CSN: [id number] Arrival date & time: 08/20/16  1045  By signing my name below, I, Rayna Sexton, attest that this documentation has been prepared under the direction and in the presence of Noemi Chapel, MD. Electronically Signed: Rayna Sexton, ED Scribe. 08/20/16. 1:21 PM.   History   Chief Complaint Chief Complaint  Patient presents with  . Palpitations    HPI HPI Comments: Shari Collins is a 40 y.o. female who presents to the Emergency Department complaining of intermittent palpitations onset February of 2016 s/p a miscarriage. Pt states she is concerned due to "not feeling my heart beat and when I do it is fluttering". Last night she woke up to use the bathroom and experienced palpitations with a constant right arm pain and right hand tingling which is still present. She also complains of right neck pain which began "a month or two ago" while at work attempting to catch a heavy box that fell. Pt also notes "watery" diarrhea yesterday and took an OTC medication for diarrhea with relief. She walks for exercise and denies it worsens her symptoms. Pt has a h/o IDDM and states that she takes her insulin daily but does not regularly check her CBG. Pt is out of her medications for high cholesterol and HTN. She denies leg pain, SOB, leg swelling, appetite changes, recent weight changes, nausea, vomiting, bloody stools and rectal bleeding.   The history is provided by the patient and medical records. No language interpreter was used.    Past Medical History:  Diagnosis Date  . Asthma   . Depression   . Diabetes mellitus   . Noncompliance     Patient Active Problem List   Diagnosis Date Noted  . Hyperlipidemia 10/07/2015  . Breast abscess 10/02/2015  . Schizophrenia ([place]) 10/02/2015  . Bipolar disorder, unspecified 10/02/2015  . Uncontrolled type 2 diabetes mellitus with complication ([place]) [id number]  . Personal history of noncompliance with  medical treatment, presenting hazards to health 09/24/2015    Past Surgical History:  Procedure Laterality Date  . CESAREAN SECTION    . TONSILLECTOMY      OB History    Gravida Para Term Preterm AB Living   4 1 1   2 1    SAB TAB Ectopic Multiple Live Births   2               Home Medications    Prior to Admission medications   Medication Sig Start Date End Date Taking? Authorizing Provider  albuterol (PROVENTIL HFA;VENTOLIN HFA) 108 (90 BASE) MCG/ACT inhaler Inhale 2 puffs into the lungs every 6 (six) hours as needed for wheezing or shortness of breath.   Yes Historical Provider, MD  citalopram (CELEXA) 40 MG tablet Take 40 mg by mouth 2 (two) times daily.    Yes Historical Provider, MD  clonazePAM (KLONOPIN) 0.5 MG tablet Take 0.5 mg by mouth 2 (two) times daily.   Yes Historical Provider, MD  ibuprofen (ADVIL,MOTRIN) 200 MG tablet Take 3 tablets (600 mg total) by mouth every 6 (six) hours as needed. 04/27/16  Yes Virgel Manifold, MD  insulin aspart (NOVOLOG) 100 UNIT/ML injection Inject 25 Units into the skin 3 (three) times daily with meals.    Yes Historical Provider, MD  Insulin Glargine (LANTUS SOLOSTAR) 100 UNIT/ML SOPN Inject 50 Units into the skin at bedtime.    Yes Historical Provider, MD  RisperiDONE (RISPERDAL PO) Take 2 mg by mouth 2 (two) times daily.  Yes Historical Provider, MD    Family History No family history on file.  Social History Social History  Substance Use Topics  . Smoking status: Current Every Day Smoker    Packs/day: 0.50    Types: Cigarettes  . Smokeless tobacco: Never Used  . Alcohol use No     Allergies   Bee venom; Latex; and Sulfa antibiotics   Review of Systems Review of Systems  Constitutional: Negative for appetite change and unexpected weight change.  Respiratory: Negative for shortness of breath.   Cardiovascular: Positive for palpitations. Negative for leg swelling.  Gastrointestinal: Positive for diarrhea. Negative for  anal bleeding, blood in stool, constipation, nausea and vomiting.  Musculoskeletal: Positive for arthralgias and neck pain.  All other systems reviewed and are negative.  Physical Exam Updated Vital Signs BP 135/84   Pulse 78   Temp 99.1 F (37.3 C) (Oral)   Resp 19   Ht 5\' 6"  (1.676 m)   Wt 220 lb (99.8 kg)   LMP 07/28/2016   SpO2 95%   BMI 35.51 kg/m   Physical Exam  Constitutional: She appears well-developed and well-nourished. No distress.  HENT:  Head: Normocephalic and atraumatic.  Mouth/Throat: Oropharynx is clear and moist. No oropharyngeal exudate.  Eyes: Conjunctivae and EOM are normal. Pupils are equal, round, and reactive to light. Right eye exhibits no discharge. Left eye exhibits no discharge. No scleral icterus.  No proptosis.   Neck: Normal range of motion. Neck supple. No JVD present. No thyromegaly present.  Cardiovascular: Normal rate, regular rhythm, normal heart sounds and intact distal pulses.  Exam reveals no gallop and no friction rub.   No murmur heard. No ectopy auscultated. Strong pulses radial arteries. No JVD.   Pulmonary/Chest: Effort normal and breath sounds normal. No respiratory distress. She has no wheezes. She has no rales.  Abdominal: Soft. Bowel sounds are normal. She exhibits no distension and no mass. There is no tenderness.  Musculoskeletal: Normal range of motion. She exhibits no edema or tenderness.  Lymphadenopathy:    She has no cervical adenopathy.  Neurological: She is alert. Coordination normal.  Skin: Skin is warm and dry. No rash noted. No erythema.  Psychiatric: She has a normal mood and affect. Her behavior is normal.  No anxiety noted. Appears comfortable and relaxed.   Nursing note and vitals reviewed.  ED Treatments / Results  Labs (all labs ordered are listed, but only abnormal results are displayed) Labs Reviewed  CBC WITH DIFFERENTIAL/PLATELET - Abnormal; Notable for the following:       Result Value   WBC 11.8 (*)     Hemoglobin 15.1 (*)    Eosinophils Absolute 1.0 (*)    All other components within normal limits  BASIC METABOLIC PANEL - Abnormal; Notable for the following:    Sodium 131 (*)    Chloride 99 (*)    Glucose, Bld 207 (*)    Calcium 8.8 (*)    All other components within normal limits  CBG MONITORING, ED - Abnormal; Notable for the following:    Glucose-Capillary 214 (*)    All other components within normal limits  TROPONIN I    EKG  EKG Interpretation  Date/Time:  Thursday August 20 2016 11:00:24 EST Ventricular Rate:  84 PR Interval:    QRS Duration: 101 QT Interval:  344 QTC Calculation: 407 R Axis:   -29 Text Interpretation:  Sinus rhythm S1,S2,S3 pattern Anteroseptal infarct, age indeterminate since last tracing no significant change Confirmed by  Shadoe Cryan  MD, [place] ([id number]) on 08/20/2016 12:27:26 PM       Radiology Dg Chest 2 View  Result Date: 08/20/2016 CLINICAL DATA:  Chest pain and dizziness. EXAM: CHEST  2 VIEW COMPARISON:  11/21/2012 and 07/10/2010 radiographs FINDINGS: Mild cardiomegaly and mild peribronchial thickening again noted. There is no evidence of focal airspace disease, pulmonary edema, suspicious pulmonary nodule/mass, pleural effusion, or pneumothorax. No acute bony abnormalities are identified. IMPRESSION: Mild cardiomegaly without evidence of acute cardiopulmonary disease. Electronically Signed   By: Margarette Canada M.D.   On: 08/20/2016 11:49    Procedures Procedures  DIAGNOSTIC STUDIES: Oxygen Saturation is 100% on RA, normal by my interpretation.    COORDINATION OF CARE: 12:27 PM Discussed next steps with pt. Pt verbalized understanding and is agreeable with the plan.    Medications Ordered in ED Medications - No data to display   Initial Impression / Assessment and Plan / ED Course  I have reviewed the triage vital signs and the nursing notes.  Pertinent labs & imaging results that were available during my care of the patient were  reviewed by me and considered in my medical decision making (see chart for details).  Clinical Course     The patient is well-appearing, she has no ectopy on the monitor, the EKG is unremarkable, the labs are also unremarkable except for mild hyponatremia and mild hyperglycemia meter of which would be causing her symptoms. She has not had any identifiable arrhythmia or frequent PVCs and at this point is stable for discharge and follow-up in the outpatient setting. The patient expressed her understanding to the indications for return as well as the results of first studies   I personally performed the services described in this documentation, which was scribed in my presence. The recorded information has been reviewed and is accurate.      Final Clinical Impressions(s) / ED Diagnoses   Final diagnoses:  Palpitations  Heart palpitations    New Prescriptions Current Discharge Medication List       Noemi Chapel, MD 08/20/16 1321

## 2016-09-26 ENCOUNTER — Emergency Department (HOSPITAL_COMMUNITY): Payer: Self-pay

## 2016-09-26 ENCOUNTER — Encounter (HOSPITAL_COMMUNITY): Payer: Self-pay | Admitting: Emergency Medicine

## 2016-09-26 ENCOUNTER — Emergency Department (HOSPITAL_COMMUNITY)
Admission: EM | Admit: 2016-09-26 | Discharge: 2016-09-26 | Disposition: A | Discharge status: Home Health | Payer: Medicaid Other | Attending: Emergency Medicine | Admitting: Emergency Medicine

## 2016-09-26 DIAGNOSIS — Z794 Long term (current) use of insulin: Secondary | ICD-10-CM | POA: Insufficient documentation

## 2016-09-26 DIAGNOSIS — R1031 Right lower quadrant pain: Secondary | ICD-10-CM

## 2016-09-26 DIAGNOSIS — R110 Nausea: Secondary | ICD-10-CM | POA: Insufficient documentation

## 2016-09-26 DIAGNOSIS — Z79899 Other long term (current) drug therapy: Secondary | ICD-10-CM | POA: Insufficient documentation

## 2016-09-26 DIAGNOSIS — B9689 Other specified bacterial agents as the cause of diseases classified elsewhere: Secondary | ICD-10-CM

## 2016-09-26 DIAGNOSIS — F17210 Nicotine dependence, cigarettes, uncomplicated: Secondary | ICD-10-CM | POA: Insufficient documentation

## 2016-09-26 DIAGNOSIS — J45909 Unspecified asthma, uncomplicated: Secondary | ICD-10-CM | POA: Insufficient documentation

## 2016-09-26 DIAGNOSIS — R197 Diarrhea, unspecified: Secondary | ICD-10-CM | POA: Insufficient documentation

## 2016-09-26 DIAGNOSIS — N760 Acute vaginitis: Secondary | ICD-10-CM | POA: Insufficient documentation

## 2016-09-26 DIAGNOSIS — E119 Type 2 diabetes mellitus without complications: Secondary | ICD-10-CM | POA: Insufficient documentation

## 2016-09-26 LAB — POC URINE PREG, ED: Preg Test, Ur: NEGATIVE

## 2016-09-26 LAB — URINALYSIS, ROUTINE W REFLEX MICROSCOPIC
Bilirubin Urine: NEGATIVE
Glucose, UA: 150 mg/dL — AB
Ketones, ur: 5 mg/dL — AB
NITRITE: NEGATIVE
PROTEIN: 100 mg/dL — AB
Specific Gravity, Urine: 1.025 (ref 1.005–1.030)
pH: 5 (ref 5.0–8.0)

## 2016-09-26 LAB — COMPREHENSIVE METABOLIC PANEL
ALT: 13 U/L — AB (ref 14–54)
AST: 11 U/L — AB (ref 15–41)
Albumin: 4.5 g/dL (ref 3.5–5.0)
Alkaline Phosphatase: 67 U/L (ref 38–126)
Anion gap: 9 (ref 5–15)
BILIRUBIN TOTAL: 1.1 mg/dL (ref 0.3–1.2)
BUN: 13 mg/dL (ref 6–20)
CO2: 24 mmol/L (ref 22–32)
Calcium: 8.9 mg/dL (ref 8.9–10.3)
Chloride: 100 mmol/L — ABNORMAL LOW (ref 101–111)
Creatinine, Ser: 0.92 mg/dL (ref 0.44–1.00)
GFR calc Af Amer: >60 mL/min (ref >60)
Glucose, Bld: 222 mg/dL — ABNORMAL HIGH (ref 65–99)
POTASSIUM: 3.6 mmol/L (ref 3.5–5.1)
Sodium: 133 mmol/L — ABNORMAL LOW (ref 135–145)
TOTAL PROTEIN: 8.3 g/dL — AB (ref 6.5–8.1)

## 2016-09-26 LAB — CBC
HEMATOCRIT: 44.3 % (ref 36.0–46.0)
Hemoglobin: 15.6 g/dL — ABNORMAL HIGH (ref 12.0–15.0)
MCH: 31.3 pg (ref 26.0–34.0)
MCHC: 35.2 g/dL (ref 30.0–36.0)
MCV: 88.8 fL (ref 78.0–100.0)
Platelets: 267 10*3/uL (ref 150–400)
RBC: 4.99 MIL/uL (ref 3.87–5.11)
RDW: 13.2 % (ref 11.5–15.5)
WBC: 21.3 10*3/uL — AB (ref 4.0–10.5)

## 2016-09-26 LAB — I-STAT CG4 LACTIC ACID, ED: LACTIC ACID, VENOUS: 1.26 mmol/L (ref 0.5–1.9)

## 2016-09-26 LAB — WET PREP, GENITAL
Sperm: NONE SEEN
TRICH WET PREP: NONE SEEN
Yeast Wet Prep HPF POC: NONE SEEN

## 2016-09-26 LAB — LIPASE, BLOOD: Lipase: 12 U/L (ref 11–51)

## 2016-09-26 MED ORDER — SODIUM CHLORIDE 0.9 % IV BOLUS (SEPSIS)
500.0000 mL | Freq: Once | INTRAVENOUS | Status: AC
  Administered 2016-09-26: 500 mL via INTRAVENOUS

## 2016-09-26 MED ORDER — IOPAMIDOL (ISOVUE-300) INJECTION 61%
100.0000 mL | Freq: Once | INTRAVENOUS | Status: AC | PRN
  Administered 2016-09-26: 100 mL via INTRAVENOUS

## 2016-09-26 MED ORDER — DOXYCYCLINE HYCLATE 100 MG PO CAPS: 100.0000 mg | ORAL_CAPSULE | Freq: Two times a day (BID) | ORAL | 0 refills | Status: DC

## 2016-09-26 MED ORDER — METRONIDAZOLE 500 MG PO TABS: 500.0000 mg | ORAL_TABLET | Freq: Two times a day (BID) | ORAL | 0 refills | Status: DC

## 2016-09-26 MED ORDER — CEFTRIAXONE SODIUM 250 MG IJ SOLR
250.0000 mg | Freq: Once | INTRAMUSCULAR | Status: AC
  Administered 2016-09-26: 250 mg via INTRAMUSCULAR
  Filled 2016-09-26: qty 250

## 2016-09-26 MED ORDER — LIDOCAINE HCL (PF) 1 % IJ SOLN
INTRAMUSCULAR | Status: AC
  Administered 2016-09-26: 0.9 mL
  Filled 2016-09-26: qty 5

## 2016-09-26 NOTE — ED Provider Notes (Signed)
[facility] DEPT Provider Note   CSN: [id number] Arrival date & time: 09/26/16  1056     History   Chief Complaint Chief Complaint  Patient presents with  . Abdominal Pain    HPI Shari Collins is a 41 y.o. female with PMH of Asthma, DM2, hx of nephrolithiasis, hx ovarian cysts who presents with RLQ abdominal pain.   HPI Patient presents with constant RLQ abdominal pain that started suddenly around 11:30 PM last night which woke her up from sleep. She describes it as a burning sensation without radiation. Pain is constant. Aggravated by moving or coughing. Not alleviated by anything. No prior history of symptoms. Reports of fever to 100.7 F at home today. Denies dysuria or increased urinary frequency. Reports of history of kidney stones but symptoms are not similar to this. Reports of loose stools x3 since symptoms started; unsure if stools have blood because she is currently on her period. Reports of nausea but no vomiting. Reports she is currently on her period which began Tuesday; reports this is a normal period.. She is sexually active with one partner (not new); she does not use protection or contraception.  History of c-section x 2 but no other abdominal surgeries. No sick contacts. Normal PO intake until this morning.   Past Medical History:  Diagnosis Date  . Asthma   . Depression   . Diabetes mellitus   . Noncompliance     Patient Active Problem List   Diagnosis Date Noted  . Hyperlipidemia 10/07/2015  . Breast abscess 10/02/2015  . Schizophrenia ([place]) 10/02/2015  . Bipolar disorder, unspecified 10/02/2015  . Uncontrolled type 2 diabetes mellitus with complication ([place]) [id number]  . Personal history of noncompliance with medical treatment, presenting hazards to health 09/24/2015    Past Surgical History:  Procedure Laterality Date  . CESAREAN SECTION    . TONSILLECTOMY      OB History    Gravida Para Term Preterm AB Living   4 1 1   2 1    SAB TAB Ectopic  Multiple Live Births   2               Home Medications    Prior to Admission medications   Medication Sig Start Date End Date Taking? Authorizing Provider  albuterol (PROVENTIL HFA;VENTOLIN HFA) 108 (90 BASE) MCG/ACT inhaler Inhale 2 puffs into the lungs every 6 (six) hours as needed for wheezing or shortness of breath.   Yes Historical Provider, MD  citalopram (CELEXA) 40 MG tablet Take 40 mg by mouth 2 (two) times daily.    Yes Historical Provider, MD  clonazePAM (KLONOPIN) 0.5 MG tablet Take 0.5 mg by mouth 2 (two) times daily.   Yes Historical Provider, MD  ibuprofen (ADVIL,MOTRIN) 200 MG tablet Take 3 tablets (600 mg total) by mouth every 6 (six) hours as needed. 04/27/16  Yes Virgel Manifold, MD  insulin aspart (NOVOLOG) 100 UNIT/ML injection Inject 25 Units into the skin 3 (three) times daily with meals.    Yes Historical Provider, MD  Insulin Glargine (LANTUS SOLOSTAR) 100 UNIT/ML SOPN Inject 50 Units into the skin at bedtime.    Yes Historical Provider, MD  RisperiDONE (RISPERDAL PO) Take 2 mg by mouth 2 (two) times daily.    Yes Historical Provider, MD  doxycycline (VIBRAMYCIN) 100 MG capsule Take 1 capsule (100 mg total) by mouth 2 (two) times daily. 09/26/16   [name], MD  metroNIDAZOLE (FLAGYL) 500 MG tablet Take 1 tablet (500 mg total)  by mouth 2 (two) times daily. 09/26/16   [name], MD    Family History History reviewed. No pertinent family history.  Social History Social History  Substance Use Topics  . Smoking status: Current Every Day Smoker    Packs/day: 0.50    Types: Cigarettes  . Smokeless tobacco: Never Used  . Alcohol use No     Allergies   Bee venom; Latex; and Sulfa antibiotics   Review of Systems Review of Systems  Constitutional: Positive for appetite change and fever. Negative for chills.  Gastrointestinal: Positive for abdominal pain, diarrhea and nausea. Negative for constipation.  Genitourinary: Negative for dysuria,  frequency, urgency and vaginal discharge.       Currently on period    Physical Exam Updated Vital Signs BP 132/85   Pulse 67   Temp 97.5 F (36.4 C) (Oral)   Resp 18   Ht 5\' 6"  (1.676 m)   Wt 97.1 kg   LMP 09/22/2016   SpO2 98%   BMI 34.54 kg/m   Physical Exam  Constitutional: She is oriented to person, place, and time. She appears well-developed and well-nourished. No distress.  HENT:  Mouth/Throat: Oropharynx is clear and moist.  Cardiovascular: Normal rate and regular rhythm.  Exam reveals no gallop and no friction rub.   No murmur heard. Pulmonary/Chest: Effort normal and breath sounds normal. No respiratory distress. She has no wheezes. She has no rales.  Abdominal: Soft. There is tenderness (mainly in the RLQ with guarding during deep palpation but no rebound. mild tenderness in the RUQ to palpation as well ).  Bowel sounds normal to mildly hypoactive.Reports of RLQ discomfort while tapping right heel.   Genitourinary: Vaginal discharge found.  Genitourinary Comments: Female genitalia: normal external genitalia, vulva, vagina, cervix, uterus and adnexa. No cervical motion tenderness. Patient currently on her period. On speculum exam, blood and some yellowish discharge/material noted to come from cervical os.    Neurological: She is alert and oriented to person, place, and time.  Skin: Skin is warm and dry. Capillary refill takes less than 2 seconds.  Psychiatric: She has a normal mood and affect. Her behavior is normal.     ED Treatments / Results  Labs (all labs ordered are listed, but only abnormal results are displayed) Labs Reviewed  WET PREP, GENITAL - Abnormal; Notable for the following:       Result Value   Clue Cells Wet Prep HPF POC PRESENT (*)    WBC, Wet Prep HPF POC MANY (*)    All other components within normal limits  COMPREHENSIVE METABOLIC PANEL - Abnormal; Notable for the following:    Sodium 133 (*)    Chloride 100 (*)    Glucose, Bld 222 (*)      Total Protein 8.3 (*)    AST 11 (*)    ALT 13 (*)    All other components within normal limits  CBC - Abnormal; Notable for the following:    WBC 21.3 (*)    Hemoglobin 15.6 (*)    All other components within normal limits  URINALYSIS, ROUTINE W REFLEX MICROSCOPIC - Abnormal; Notable for the following:    Color, Urine AMBER (*)    APPearance HAZY (*)    Glucose, UA 150 (*)    Hgb urine dipstick SMALL (*)    Ketones, ur 5 (*)    Protein, ur 100 (*)    Leukocytes, UA SMALL (*)    Bacteria, UA FEW (*)  All other components within normal limits  LIPASE, BLOOD  I-STAT CG4 LACTIC ACID, ED  POC URINE PREG, ED  GC/CHLAMYDIA PROBE AMP ([place]) NOT AT [facility]    EKG  EKG Interpretation None       Radiology Ct Abdomen Pelvis W Contrast  Result Date: 09/26/2016 CLINICAL DATA:  Right lower quadrant pain with diarrhea, nausea, and fever. EXAM: CT ABDOMEN AND PELVIS WITH CONTRAST TECHNIQUE: Multidetector CT imaging of the abdomen and pelvis was performed using the standard protocol following bolus administration of intravenous contrast. CONTRAST:  167mL ISOVUE-300 IOPAMIDOL (ISOVUE-300) INJECTION 61% COMPARISON:  11/21/2012 FINDINGS: Lower chest: No acute abnormality. Hepatobiliary: Mild diffuse hepatic steatosis. Minimal gallbladder sludge. Pancreas: Unremarkable. Spleen: Unremarkable. Adrenals/Urinary Tract: Right adrenal mass has increased from 2.5 x 3.1 cm to 3.5 x 3.0 cm. Malignancy is not excluded. A left adrenal mass measures 2.2 by 1.1 cm and is subjectively larger than on the prior study. Kidneys are somewhat lobulated but otherwise unremarkable. Bladder is unremarkable. Stomach/Bowel: Stomach is unremarkable. Normal appendix. No obvious focal mass in the colon. Vascular/Lymphatic: No evidence of aortic aneurysm. Minimal smooth plaque in the infrarenal aorta. No abnormal retroperitoneal adenopathy. Small inguinal nodes. Reproductive: Uterus and adnexa are within normal limits.  Other: No free-fluid.  1.4 cm nodule in the left breast. Musculoskeletal: Degenerative disc disease in the lumbar spine. No vertebral compression deformity. IMPRESSION: Bilateral adrenal masses are enlarging.  Malignancy is not excluded. Normal appendix. Diffuse hepatic steatosis 1.4 cm left breast nodule.  Mammography is recommended. Electronically Signed   By: Marybelle Killings M.D.   On: 09/26/2016 14:13    Procedures Procedures (including critical care time)  Medications Ordered in ED Medications  cefTRIAXone (ROCEPHIN) injection 250 mg (not administered)  lidocaine (PF) (XYLOCAINE) 1 % injection (not administered)  iopamidol (ISOVUE-300) 61 % injection 100 mL (100 mLs Intravenous Contrast Given 09/26/16 1340)  sodium chloride 0.9 % bolus 500 mL (500 mLs Intravenous New Bag/Given 09/26/16 1451)     Initial Impression / Assessment and Plan / ED Course  I have reviewed the triage vital signs and the nursing notes.  Pertinent labs & imaging results that were available during my care of the patient were reviewed by me and considered in my medical decision making (see chart for details).  Patient with sudden onset RLQ abdominal pain, nausea, and fever at home. Vitals are stable and afebrile in the ED. Exam not consistent with acute abdomen but does have RLQ tenderness to palpation. CBC with leukocytosis to 21.3. CMP with mild elevation of creatinine to 0.92 (baseline ~0.6) with unremarkable LFTs, elevated glucose 222 (similar past levels in labs) and AG is normal. Lipase is normal. Lactic acid is normal. Urine pregnancy negative. UA with small leukocytes, small hgb (patient is on her period), negative nitrite, 5 ketones. Patient has no urinary symptoms therefore will not treat for UA lab; will order urine culture. Will obtain CT abdomen and pelvis to evaluate for appendicitis. Will also give 500cc bolus.   CT abdomen with normal appendix and no findings to explain her pain. Does note bilateral adrenal  masses that are enlarging. Also 1.4 cm breast nodule.   Pelvic exam with yellow material from cervical os mixed in blood (patient currently on her period). No adnexal tenderness or cervical motion tenderness on exam. Will do wet prep and Gc/chlamydia.   She still has tenderness to palpation on the RLQ. Her vitals are stable. Due to her leukocytosis, abdominal exam, and yellow discharge seen on speculum  exam (although no adnexal tenderness or cervical motion tenderness on bimanual exam), will treat her for possible PID. Wet prep shows clue cells. Rocephin given in the ED. Prescribed Doxycycline 100mg  BID x 14 days and Flagyl 500mg  BID x 7 days. Return precautions discussed. Discussed follow up with PCP for adrenal mass and breast nodule.        Final Clinical Impressions(s) / ED Diagnoses   Final diagnoses:  RLQ abdominal pain  Bacterial vaginosis    New Prescriptions New Prescriptions   DOXYCYCLINE (VIBRAMYCIN) 100 MG CAPSULE    Take 1 capsule (100 mg total) by mouth 2 (two) times daily.   METRONIDAZOLE (FLAGYL) 500 MG TABLET    Take 1 tablet (500 mg total) by mouth 2 (two) times daily.     [name], MD 09/26/16 [place], MD 09/26/16 [phone]

## 2016-09-26 NOTE — Discharge Instructions (Signed)
Your imaging did not show an explanation for your lower abdominal pain. However, your gall bladder did have "sludge" (which means some thick secretions) which possibly can cause some abdominal pain. Your pelvic exam was not consistent with a pelvic infection but due to your lower abdominal pain, elevated white blood cell count (cells that fight infection), and yellow discharge that was seen on your pelvic exam, we will treat you for a possible infection in your pelvis/uterus. You received one dose of an antibiotic in the ED. Please take Doxycycline for 14 days as prescribed. Your wet prep (microscope test of the vaginal fluid) showed bacterial vaginosis; for this please take Flagyl for 7 days. Please do not drink alcohol during and for at least 2 days after completing flagyl as this can cause a bad reaction with vomiting and flushing. Please return to the ED if your symptoms worsen. Please follow up with your primary care provider soon; your imaging showed enlarging mass on both of your adrenal glands. Your primary care provider needs to follow up with this.

## 2016-09-26 NOTE — ED Triage Notes (Signed)
Pt stated she woke up with RLO pain. Diarrhea and nausea. Pt stated she had 100.70m temp. No vomiting. No cough.

## 2016-09-28 LAB — GC/CHLAMYDIA PROBE AMP (~~LOC~~) NOT AT ARMC
CHLAMYDIA, DNA PROBE: NEGATIVE
Neisseria Gonorrhea: POSITIVE — AB

## 2017-01-07 ENCOUNTER — Encounter (HOSPITAL_COMMUNITY): Payer: Self-pay

## 2017-01-07 ENCOUNTER — Emergency Department (HOSPITAL_COMMUNITY)
Admission: EM | Admit: 2017-01-07 | Discharge: 2017-01-07 | Disposition: A | Discharge status: Home / Self Care | Payer: Medicaid Other | Attending: Emergency Medicine | Admitting: Emergency Medicine

## 2017-01-07 DIAGNOSIS — F17210 Nicotine dependence, cigarettes, uncomplicated: Secondary | ICD-10-CM | POA: Insufficient documentation

## 2017-01-07 DIAGNOSIS — Z794 Long term (current) use of insulin: Secondary | ICD-10-CM | POA: Insufficient documentation

## 2017-01-07 DIAGNOSIS — J45909 Unspecified asthma, uncomplicated: Secondary | ICD-10-CM | POA: Insufficient documentation

## 2017-01-07 DIAGNOSIS — Z79899 Other long term (current) drug therapy: Secondary | ICD-10-CM | POA: Insufficient documentation

## 2017-01-07 DIAGNOSIS — M545 Low back pain, unspecified: Secondary | ICD-10-CM

## 2017-01-07 DIAGNOSIS — E119 Type 2 diabetes mellitus without complications: Secondary | ICD-10-CM | POA: Insufficient documentation

## 2017-01-07 LAB — CBG MONITORING, ED: Glucose-Capillary: 257 mg/dL — ABNORMAL HIGH (ref 65–99)

## 2017-01-07 MED ORDER — MELOXICAM 7.5 MG PO TABS: 7.5000 mg | ORAL_TABLET | Freq: Every day | ORAL | 0 refills | Status: AC

## 2017-01-07 MED ORDER — KETOROLAC TROMETHAMINE 60 MG/2ML IM SOLN
60.0000 mg | Freq: Once | INTRAMUSCULAR | Status: AC
  Administered 2017-01-07: 60 mg via INTRAMUSCULAR
  Filled 2017-01-07: qty 2

## 2017-01-07 MED ORDER — CYCLOBENZAPRINE HCL 10 MG PO TABS: 10.0000 mg | ORAL_TABLET | Freq: Two times a day (BID) | ORAL | 0 refills | Status: DC | PRN

## 2017-01-07 NOTE — ED Triage Notes (Signed)
Pt reports r sided lower back pain radiating down r leg since yesterday.  Denies injury.  Denies any problems controlling bowels or bladder.

## 2017-01-07 NOTE — ED Provider Notes (Signed)
[facility] DEPT Provider Note   CSN: [id number] Arrival date & time: 01/07/17  3267  By signing my name below, I, [name], attest that this documentation has been prepared under the direction and in the presence of [name], MD . Electronically Signed: [name], Scribe. 01/07/2017. 10:18 AM.  History   Chief Complaint Chief Complaint  Patient presents with  . Back Pain   The history is provided by the patient. No language interpreter was used.   HPI Comments: Shari Collins is a 41 y.o. female with PMHx of DM, who presents to the Emergency Department complaining of gradually worsening, intermittent, right-sided lower back pain radiating down her right leg that began last night. Pt describes her pain as "someone pushing down on my back" and states her pain is exacerbated when moving from a seated to standing position. She notes hx of sciatica but states her last exacerbation occurred ~16 years ago. Pt reports she has taken Ibuprofen, which normally relieves her pain, without any relief. She denies any dysuria, urinary frequency, constipation, diarrhea, saddle anaesthesia, urinary or bowel incontinence, weakness in her bilateral legs, recent changes in her appetite, or rash.   Past Medical History:  Diagnosis Date  . Asthma   . Depression   . Diabetes mellitus   . Noncompliance     Patient Active Problem List   Diagnosis Date Noted  . Hyperlipidemia 10/07/2015  . Breast abscess 10/02/2015  . Schizophrenia ([place]) 10/02/2015  . Bipolar disorder, unspecified ([place]) 10/02/2015  . Uncontrolled type 2 diabetes mellitus with complication ([place]) 12/45/8099  . Personal history of noncompliance with medical treatment, presenting hazards to health 09/24/2015    Past Surgical History:  Procedure Laterality Date  . CESAREAN SECTION    . TONSILLECTOMY      OB History    Gravida Para Term Preterm AB Living   4 1 1   2 1    SAB TAB Ectopic Multiple Live Births   2              Home Medications    Prior to Admission medications   Medication Sig Start Date End Date Taking? Authorizing Provider  albuterol (PROVENTIL HFA;VENTOLIN HFA) 108 (90 BASE) MCG/ACT inhaler Inhale 2 puffs into the lungs every 6 (six) hours as needed for wheezing or shortness of breath.    Historical Provider, MD  citalopram (CELEXA) 40 MG tablet Take 40 mg by mouth 2 (two) times daily.     Historical Provider, MD  clonazePAM (KLONOPIN) 0.5 MG tablet Take 0.5 mg by mouth 2 (two) times daily.    Historical Provider, MD  cyclobenzaprine (FLEXERIL) 10 MG tablet Take 1 tablet (10 mg total) by mouth 2 (two) times daily as needed for muscle spasms. 01/07/17   [name], MD  doxycycline (VIBRAMYCIN) 100 MG capsule Take 1 capsule (100 mg total) by mouth 2 (two) times daily. 09/26/16   [name], MD  ibuprofen (ADVIL,MOTRIN) 200 MG tablet Take 3 tablets (600 mg total) by mouth every 6 (six) hours as needed. 04/27/16   Virgel Manifold, MD  insulin aspart (NOVOLOG) 100 UNIT/ML injection Inject 25 Units into the skin 3 (three) times daily with meals.     Historical Provider, MD  Insulin Glargine (LANTUS SOLOSTAR) 100 UNIT/ML SOPN Inject 50 Units into the skin at bedtime.     Historical Provider, MD  meloxicam (MOBIC) 7.5 MG tablet Take 1 tablet (7.5 mg total) by mouth daily. 01/07/17 01/21/17  [name], MD  metroNIDAZOLE (  FLAGYL) 500 MG tablet Take 1 tablet (500 mg total) by mouth 2 (two) times daily. 09/26/16   [name], MD  RisperiDONE (RISPERDAL PO) Take 2 mg by mouth 2 (two) times daily.     Historical Provider, MD    Family History No family history on file.  Social History Social History  Substance Use Topics  . Smoking status: Current Every Day Smoker    Packs/day: 0.50    Types: Cigarettes  . Smokeless tobacco: Never Used  . Alcohol use No     Allergies   Bee venom; Latex; and Sulfa antibiotics  Review of Systems Review of Systems  Constitutional: Negative for  appetite change.  Gastrointestinal: Negative for constipation and diarrhea.  Genitourinary: Negative for dysuria and frequency.  Musculoskeletal: Positive for arthralgias and back pain.  Skin: Negative for rash.  Neurological: Negative for weakness.   Physical Exam Updated Vital Signs BP (!) 161/89 (BP Location: Right Arm)   Pulse 85   Temp 98.3 F (36.8 C) (Oral)   Resp 18   Ht 5\' 6"  (1.676 m)   Wt 200 lb (90.7 kg)   LMP 12/10/2016   SpO2 100%   BMI 32.28 kg/m   Physical Exam  Constitutional: She is oriented to person, place, and time. She appears well-developed and well-nourished. No distress.  HENT:  Head: Normocephalic and atraumatic.  Eyes: EOM are normal.  Neck: Normal range of motion.  Cardiovascular: Normal rate, regular rhythm and normal heart sounds.   Pulmonary/Chest: Effort normal and breath sounds normal.  Abdominal: Soft. She exhibits no distension. There is no tenderness.  Musculoskeletal: She exhibits tenderness.  Right lower back tenderness. Good peripheral pulses, normal DP pulse.   Neurological: She is alert and oriented to person, place, and time.  Normal strength and sensation in BUE and BLE. No saddle anaesthesia.   Skin: Skin is warm and dry.  Psychiatric: She has a normal mood and affect. Judgment normal.  Nursing note and vitals reviewed.  ED Treatments / Results  DIAGNOSTIC STUDIES:  Oxygen Saturation is 100% on RA, normal by my interpretation.    COORDINATION OF CARE:  10:17 AM Discussed treatment plan with pt at bedside and pt agreed to plan.  Labs (all labs ordered are listed, but only abnormal results are displayed) Labs Reviewed  CBG MONITORING, ED - Abnormal; Notable for the following:       Result Value   Glucose-Capillary 257 (*)    All other components within normal limits    EKG  EKG Interpretation None       Radiology No results found.  Procedures Procedures (including critical care time)  Medications Ordered in  ED Medications  ketorolac (TORADOL) injection 60 mg (not administered)    Initial Impression / Assessment and Plan / ED Course  I have reviewed the triage vital signs and the nursing notes.  Pertinent labs & imaging results that were available during my care of the patient were reviewed by me and considered in my medical decision making (see chart for details).     Low suspicion for UTI/pyelo/vertebral injury/spinal cord injury. Feel it is mostly MSK in nature and will treat accordingly. No indication for imaging at this time.   I personally performed the services described in this documentation, which was scribed in my presence. The recorded information has been reviewed and is accurate.   Final Clinical Impressions(s) / ED Diagnoses   Final diagnoses:  Acute right-sided low back pain without sciatica  New Prescriptions New Prescriptions   CYCLOBENZAPRINE (FLEXERIL) 10 MG TABLET    Take 1 tablet (10 mg total) by mouth 2 (two) times daily as needed for muscle spasms.   MELOXICAM (MOBIC) 7.5 MG TABLET    Take 1 tablet (7.5 mg total) by mouth daily.     [name], MD 01/07/17 1026

## 2017-04-10 ENCOUNTER — Emergency Department (HOSPITAL_COMMUNITY)
Admission: EM | Admit: 2017-04-10 | Discharge: 2017-04-11 | Disposition: A | Discharge status: Home / Self Care | Payer: Medicaid Other | Attending: Emergency Medicine | Admitting: Emergency Medicine

## 2017-04-10 ENCOUNTER — Encounter (HOSPITAL_COMMUNITY): Payer: Self-pay | Admitting: *Deleted

## 2017-04-10 DIAGNOSIS — F17210 Nicotine dependence, cigarettes, uncomplicated: Secondary | ICD-10-CM | POA: Insufficient documentation

## 2017-04-10 DIAGNOSIS — Z79899 Other long term (current) drug therapy: Secondary | ICD-10-CM | POA: Insufficient documentation

## 2017-04-10 DIAGNOSIS — E119 Type 2 diabetes mellitus without complications: Secondary | ICD-10-CM | POA: Insufficient documentation

## 2017-04-10 DIAGNOSIS — Z91040 Latex allergy status: Secondary | ICD-10-CM | POA: Insufficient documentation

## 2017-04-10 DIAGNOSIS — J45909 Unspecified asthma, uncomplicated: Secondary | ICD-10-CM | POA: Insufficient documentation

## 2017-04-10 DIAGNOSIS — R519 Headache, unspecified: Secondary | ICD-10-CM

## 2017-04-10 DIAGNOSIS — Z794 Long term (current) use of insulin: Secondary | ICD-10-CM | POA: Insufficient documentation

## 2017-04-10 DIAGNOSIS — R51 Headache: Secondary | ICD-10-CM | POA: Insufficient documentation

## 2017-04-10 DIAGNOSIS — R110 Nausea: Secondary | ICD-10-CM | POA: Insufficient documentation

## 2017-04-10 DIAGNOSIS — I10 Essential (primary) hypertension: Secondary | ICD-10-CM | POA: Insufficient documentation

## 2017-04-10 HISTORY — DX: Essential (primary) hypertension: I10

## 2017-04-10 MED ORDER — METOCLOPRAMIDE HCL 5 MG/ML IJ SOLN
10.0000 mg | Freq: Once | INTRAMUSCULAR | Status: AC
  Administered 2017-04-10: 10 mg via INTRAVENOUS
  Filled 2017-04-10: qty 2

## 2017-04-10 MED ORDER — SODIUM CHLORIDE 0.9 % IV BOLUS (SEPSIS)
1000.0000 mL | Freq: Once | INTRAVENOUS | Status: AC
  Administered 2017-04-10: 1000 mL via INTRAVENOUS

## 2017-04-10 MED ORDER — DIPHENHYDRAMINE HCL 50 MG/ML IJ SOLN
25.0000 mg | Freq: Once | INTRAMUSCULAR | Status: AC
  Administered 2017-04-10: 25 mg via INTRAVENOUS
  Filled 2017-04-10: qty 1

## 2017-04-10 NOTE — ED Notes (Signed)
Pt states she has not taken her BP medication in about 1 year. Pt states that she has not had any trouble with high BP off of her medication until today.

## 2017-04-10 NOTE — ED Triage Notes (Signed)
Pt c/o headache that is light and sound sensitive with elevated blood pressure that started tonight, pt states that she took her blood pressure tonight but does not remember what it was,

## 2017-04-10 NOTE — ED Provider Notes (Signed)
[facility] DEPT Provider Note   CSN: [id number] Arrival date & time: 04/10/17  2220     History   Chief Complaint Chief Complaint  Patient presents with  . Hypertension  . Headache    HPI Shari Collins is a 41 y.o. female.  The history is provided by the patient.  Hypertension  Associated symptoms include headaches.  Headache    She had onset about 9 PM of a left retro-orbital headache. Headache is described as a dull pain which he rates at 4/10. It is worse with exposure to light. There has been mild nausea but no vomiting. She denies any visual changes. She denies any weakness, numbness, tingling. She has had similar headaches in the past. She went to a neighbor's house to have her blood pressure checked, and it was very high, so she came to the emergency department. She denies epistaxis surgeon-itis.  Past Medical History:  Diagnosis Date  . Asthma   . Depression   . Diabetes mellitus   . Hypertension   . Noncompliance     Patient Active Problem List   Diagnosis Date Noted  . Hyperlipidemia 10/07/2015  . Breast abscess 10/02/2015  . Schizophrenia ([place]) 10/02/2015  . Bipolar disorder, unspecified ([place]) 10/02/2015  . Uncontrolled type 2 diabetes mellitus with complication ([place]) 77/93/9030  . Personal history of noncompliance with medical treatment, presenting hazards to health 09/24/2015    Past Surgical History:  Procedure Laterality Date  . CESAREAN SECTION    . TONSILLECTOMY      OB History    Gravida Para Term Preterm AB Living   4 1 1   2 1    SAB TAB Ectopic Multiple Live Births   2               Home Medications    Prior to Admission medications   Medication Sig Start Date End Date Taking? Authorizing Provider  albuterol (PROVENTIL HFA;VENTOLIN HFA) 108 (90 BASE) MCG/ACT inhaler Inhale 2 puffs into the lungs every 6 (six) hours as needed for wheezing or shortness of breath.    [provider]  citalopram (CELEXA) 40 MG tablet Take  40 mg by mouth 2 (two) times daily.     [provider]  clonazePAM (KLONOPIN) 0.5 MG tablet Take 0.5 mg by mouth 2 (two) times daily.    [provider]  cyclobenzaprine (FLEXERIL) 10 MG tablet Take 1 tablet (10 mg total) by mouth 2 (two) times daily as needed for muscle spasms. 01/07/17   Mesner, Corene Cornea, MD  doxycycline (VIBRAMYCIN) 100 MG capsule Take 1 capsule (100 mg total) by mouth 2 (two) times daily. 09/26/16   [name], MD  ibuprofen (ADVIL,MOTRIN) 200 MG tablet Take 3 tablets (600 mg total) by mouth every 6 (six) hours as needed. 04/27/16   Virgel Manifold, MD  insulin aspart (NOVOLOG) 100 UNIT/ML injection Inject 25 Units into the skin 3 (three) times daily with meals.     [provider]  Insulin Glargine (LANTUS SOLOSTAR) 100 UNIT/ML SOPN Inject 50 Units into the skin at bedtime.     [provider]  metroNIDAZOLE (FLAGYL) 500 MG tablet Take 1 tablet (500 mg total) by mouth 2 (two) times daily. 09/26/16   [name], MD  RisperiDONE (RISPERDAL PO) Take 2 mg by mouth 2 (two) times daily.     [provider]    Family History No family history on file.  Social History Social History  Substance Use Topics  .  Smoking status: Current Every Day Smoker    Packs/day: 0.50    Types: Cigarettes  . Smokeless tobacco: Never Used  . Alcohol use No     Allergies   Bee venom; Latex; and Sulfa antibiotics   Review of Systems Review of Systems  Neurological: Positive for headaches.  All other systems reviewed and are negative.    Physical Exam Updated Vital Signs BP (!) 188/95 (BP Location: Right Arm)   Pulse 93   Temp 97.8 F (36.6 C) (Oral)   Resp 15   Ht 5\' 6"  (1.676 m)   Wt 90.7 kg (200 lb)   LMP 04/06/2017   SpO2 100%   BMI 32.28 kg/m   Physical Exam  Nursing note and vitals reviewed.  41 year old female, resting comfortably and in no acute distress. Vital signs are significant for hypertension. Oxygen  saturation is 100%, which is normal. Head is normocephalic and atraumatic. PERRLA, EOMI. Oropharynx is clear. There is mild tenderness to palpation over the left temporalis muscle, and over the insertion of the left paracervical muscles. Neck is nontender and supple without adenopathy or JVD. Back is nontender and there is no CVA tenderness. Lungs are clear without rales, wheezes, or rhonchi. Chest is nontender. Heart has regular rate and rhythm without murmur. Abdomen is soft, flat, nontender without masses or hepatosplenomegaly and peristalsis is normoactive. Extremities have no cyanosis or edema, full range of motion is present. Skin is warm and dry without rash. Neurologic: Mental status is normal, cranial nerves are intact, there are no motor or sensory deficits.  ED Treatments / Results   Procedures Procedures (including critical care time)  Medications Ordered in ED Medications  sodium chloride 0.9 % bolus 1,000 mL (1,000 mLs Intravenous New Bag/Given 04/10/17 2347)  metoCLOPramide (REGLAN) injection 10 mg (10 mg Intravenous Given 04/10/17 2347)  diphenhydrAMINE (BENADRYL) injection 25 mg (25 mg Intravenous Given 04/10/17 2348)     Initial Impression / Assessment and Plan / ED Course  I have reviewed the triage vital signs and the nursing notes.  Headache which seems most consistent with muscle contraction headache. Possible migraine variant. Doubt headache secondary to hypertension since blood pressure is not that high, and is actually come down while in the ED. Suspect exacerbation of hypertension secondary to pain. Old records are reviewed, and she has no prior ED visits for headaches. Blood pressures have been mostly borderline over the past year, but was 161/89 at an ED visit on May 3. She will be given a migraine cocktail and reassessed.  She had excellent relief of her headache with metoclopramide, diphenhydramine, normal saline. Blood pressure has come down to the normal range,  indicating that her elevated blood pressure was mainly a stress reaction to her pain. She is discharged with prescription for metoclopramide, but advised to monitor her blood pressure at home.  Final Clinical Impressions(s) / ED Diagnoses   Final diagnoses:  Bad headache    New Prescriptions New Prescriptions   METOCLOPRAMIDE (REGLAN) 10 MG TABLET    Take 1 tablet (10 mg total) by mouth every 6 (six) hours as needed for nausea (or headache).     Delora Fuel, MD 18/56/31 [id number]

## 2017-04-11 MED ORDER — METOCLOPRAMIDE HCL 10 MG PO TABS: 10.0000 mg | ORAL_TABLET | Freq: Four times a day (QID) | ORAL | 0 refills | Status: DC | PRN

## 2017-04-11 NOTE — Discharge Instructions (Signed)
Please check your blood pressure several times over the next several weeks. If it is consistently high, you will need to start treatment for it.

## 2017-04-12 ENCOUNTER — Emergency Department (HOSPITAL_COMMUNITY)
Admission: EM | Admit: 2017-04-12 | Discharge: 2017-04-12 | Disposition: A | Discharge status: Home / Self Care | Payer: Medicaid Other | Attending: Emergency Medicine | Admitting: Emergency Medicine

## 2017-04-12 ENCOUNTER — Encounter (HOSPITAL_COMMUNITY): Payer: Self-pay | Admitting: Emergency Medicine

## 2017-04-12 DIAGNOSIS — L509 Urticaria, unspecified: Secondary | ICD-10-CM | POA: Insufficient documentation

## 2017-04-12 DIAGNOSIS — R51 Headache: Secondary | ICD-10-CM | POA: Insufficient documentation

## 2017-04-12 DIAGNOSIS — E119 Type 2 diabetes mellitus without complications: Secondary | ICD-10-CM | POA: Insufficient documentation

## 2017-04-12 DIAGNOSIS — F17210 Nicotine dependence, cigarettes, uncomplicated: Secondary | ICD-10-CM | POA: Insufficient documentation

## 2017-04-12 DIAGNOSIS — T7840XA Allergy, unspecified, initial encounter: Secondary | ICD-10-CM

## 2017-04-12 DIAGNOSIS — R519 Headache, unspecified: Secondary | ICD-10-CM

## 2017-04-12 DIAGNOSIS — J45909 Unspecified asthma, uncomplicated: Secondary | ICD-10-CM | POA: Insufficient documentation

## 2017-04-12 DIAGNOSIS — I10 Essential (primary) hypertension: Secondary | ICD-10-CM | POA: Insufficient documentation

## 2017-04-12 DIAGNOSIS — Z91040 Latex allergy status: Secondary | ICD-10-CM | POA: Insufficient documentation

## 2017-04-12 MED ORDER — DIPHENHYDRAMINE HCL 50 MG/ML IJ SOLN
25.0000 mg | Freq: Once | INTRAMUSCULAR | Status: AC
  Administered 2017-04-12: 25 mg via INTRAVENOUS
  Filled 2017-04-12: qty 1

## 2017-04-12 MED ORDER — FAMOTIDINE IN NACL 20-0.9 MG/50ML-% IV SOLN
20.0000 mg | Freq: Once | INTRAVENOUS | Status: AC
  Administered 2017-04-12: 20 mg via INTRAVENOUS
  Filled 2017-04-12: qty 50

## 2017-04-12 MED ORDER — PREDNISONE 10 MG (21) PO TBPK: ORAL_TABLET | ORAL | 0 refills | Status: DC

## 2017-04-12 MED ORDER — METHYLPREDNISOLONE SODIUM SUCC 125 MG IJ SOLR
125.0000 mg | Freq: Once | INTRAMUSCULAR | Status: AC
  Administered 2017-04-12: 125 mg via INTRAVENOUS
  Filled 2017-04-12: qty 2

## 2017-04-12 MED ORDER — IBUPROFEN 600 MG PO TABS: 600.0000 mg | ORAL_TABLET | Freq: Four times a day (QID) | ORAL | 0 refills | Status: DC | PRN

## 2017-04-12 MED ORDER — KETOTIFEN FUMARATE 0.025 % OP SOLN
1.0000 [drp] | Freq: Once | OPHTHALMIC | Status: AC
  Administered 2017-04-12: 1 [drp] via OPHTHALMIC
  Filled 2017-04-12: qty 5

## 2017-04-12 NOTE — ED Provider Notes (Signed)
[facility] DEPT Provider Note   CSN: [id number] Arrival date & time: 04/12/17  1050     History   Chief Complaint Chief Complaint  Patient presents with  . Urticaria  . Pruritis    HPI Shari Collins is a 41 y.o. female.  Pt presents to the ED today with urticaria and itching.  She was seen here in the ED on 8/5 for headache.  She was given a rx for reglan.  She was given a dose in the ED of the h/a, and did not have a problem.  She took the oral pill yesterday and broke out in hives.  The pt denies sob.  She is very itchy.      Past Medical History:  Diagnosis Date  . Asthma   . Depression   . Diabetes mellitus   . Hypertension   . Noncompliance     Patient Active Problem List   Diagnosis Date Noted  . Hyperlipidemia 10/07/2015  . Breast abscess 10/02/2015  . Schizophrenia ([place]) 10/02/2015  . Bipolar disorder, unspecified ([place]) 10/02/2015  . Uncontrolled type 2 diabetes mellitus with complication ([employer]) 42/59/5638  . Personal history of noncompliance with medical treatment, presenting hazards to health 09/24/2015    Past Surgical History:  Procedure Laterality Date  . CESAREAN SECTION    . TONSILLECTOMY      OB History    Gravida Para Term Preterm AB Living   4 1 1   2 1    SAB TAB Ectopic Multiple Live Births   2               Home Medications    Prior to Admission medications   Medication Sig Start Date End Date Taking? Authorizing Provider  albuterol (PROVENTIL HFA;VENTOLIN HFA) 108 (90 BASE) MCG/ACT inhaler Inhale 2 puffs into the lungs every 6 (six) hours as needed for wheezing or shortness of breath.   Yes [provider]  metoCLOPramide (REGLAN) 10 MG tablet Take 1 tablet (10 mg total) by mouth every 6 (six) hours as needed for nausea (or headache). 03/12/63  Yes Delora Fuel, MD  ibuprofen (ADVIL,MOTRIN) 600 MG tablet Take 1 tablet (600 mg total) by mouth every 6 (six) hours as needed. 04/12/17   Isla Pence, MD    Family  History No family history on file.  Social History Social History  Substance Use Topics  . Smoking status: Current Every Day Smoker    Packs/day: 0.50    Types: Cigarettes  . Smokeless tobacco: Never Used  . Alcohol use No     Allergies   Bee venom; Latex; Reglan [metoclopramide]; and Sulfa antibiotics   Review of Systems Review of Systems  Skin: Positive for rash.  All other systems reviewed and are negative.    Physical Exam Updated Vital Signs BP 133/78 (BP Location: Right Arm)   Pulse 64   Temp 98.6 F (37 C) (Oral)   Resp 18   Ht 5\' 6"  (1.676 m)   Wt 90.7 kg (200 lb)   LMP 04/06/2017   SpO2 100%   BMI 32.28 kg/m   Physical Exam  Constitutional: She is oriented to person, place, and time. She appears well-developed and well-nourished.  HENT:  Head: Normocephalic and atraumatic.  Right Ear: External ear normal.  Left Ear: External ear normal.  Nose: Nose normal.  Mouth/Throat: Oropharynx is clear and moist.  Eyes: Pupils are equal, round, and reactive to light. EOM are normal. Right conjunctiva is injected. Left conjunctiva is injected.  Neck: Normal range of motion. Neck supple.  Cardiovascular: Normal rate, regular rhythm, normal heart sounds and intact distal pulses.   Pulmonary/Chest: Effort normal and breath sounds normal.  Abdominal: Soft. Bowel sounds are normal.  Musculoskeletal: Normal range of motion.  Neurological: She is alert and oriented to person, place, and time.  Skin: Rash noted. Rash is urticarial.  Psychiatric: She has a normal mood and affect. Her behavior is normal. Judgment and thought content normal.  Nursing note and vitals reviewed.    ED Treatments / Results  Labs (all labs ordered are listed, but only abnormal results are displayed) Labs Reviewed - No data to display  EKG  EKG Interpretation None       Radiology No results found.  Procedures Procedures (including critical care time)  Medications Ordered in  ED Medications  methylPREDNISolone sodium succinate (SOLU-MEDROL) 125 mg/2 mL injection 125 mg (125 mg Intravenous Given 04/12/17 1417)  famotidine (PEPCID) IVPB 20 mg premix (20 mg Intravenous New Bag/Given 04/12/17 1415)  diphenhydrAMINE (BENADRYL) injection 25 mg (25 mg Intravenous Given 04/12/17 1428)  ketotifen (ZADITOR) 0.025 % ophthalmic solution 1 drop (1 drop Both Eyes Given 04/12/17 1443)     Initial Impression / Assessment and Plan / ED Course  I have reviewed the triage vital signs and the nursing notes.  Pertinent labs & imaging results that were available during my care of the patient were reviewed by me and considered in my medical decision making (see chart for details).  Pt is feeling much better.  I put reglan on her allergy list.  Pt knows to return if worse and f/u with pcp.  Final Clinical Impressions(s) / ED Diagnoses   Final diagnoses:  Allergic reaction to drug, initial encounter  Acute nonintractable headache, unspecified headache type    New Prescriptions Current Discharge Medication List       Isla Pence, MD 04/12/17 1525

## 2017-04-12 NOTE — ED Notes (Signed)
Pt reports generalized itching.  No rash noted. No difficulty breathing,.

## 2017-04-12 NOTE — ED Triage Notes (Signed)
Patient complains of rash on arms and generalized itching that started this morning. States she started taking a new blood pressure medication, metoclopramide 10mg . NAD.

## 2017-04-26 ENCOUNTER — Emergency Department (HOSPITAL_COMMUNITY): Payer: Self-pay

## 2017-04-26 ENCOUNTER — Encounter (HOSPITAL_COMMUNITY): Payer: Self-pay | Admitting: Emergency Medicine

## 2017-04-26 ENCOUNTER — Emergency Department (HOSPITAL_COMMUNITY)
Admission: EM | Admit: 2017-04-26 | Discharge: 2017-04-26 | Disposition: A | Discharge status: Home / Self Care | Payer: Self-pay | Attending: Emergency Medicine | Admitting: Emergency Medicine

## 2017-04-26 DIAGNOSIS — R109 Unspecified abdominal pain: Secondary | ICD-10-CM | POA: Insufficient documentation

## 2017-04-26 DIAGNOSIS — E119 Type 2 diabetes mellitus without complications: Secondary | ICD-10-CM | POA: Insufficient documentation

## 2017-04-26 DIAGNOSIS — F17210 Nicotine dependence, cigarettes, uncomplicated: Secondary | ICD-10-CM | POA: Insufficient documentation

## 2017-04-26 DIAGNOSIS — Z7984 Long term (current) use of oral hypoglycemic drugs: Secondary | ICD-10-CM | POA: Insufficient documentation

## 2017-04-26 DIAGNOSIS — N12 Tubulo-interstitial nephritis, not specified as acute or chronic: Secondary | ICD-10-CM

## 2017-04-26 DIAGNOSIS — I10 Essential (primary) hypertension: Secondary | ICD-10-CM | POA: Insufficient documentation

## 2017-04-26 DIAGNOSIS — J45909 Unspecified asthma, uncomplicated: Secondary | ICD-10-CM | POA: Insufficient documentation

## 2017-04-26 DIAGNOSIS — Z79899 Other long term (current) drug therapy: Secondary | ICD-10-CM | POA: Insufficient documentation

## 2017-04-26 LAB — URINALYSIS, ROUTINE W REFLEX MICROSCOPIC
Bilirubin Urine: NEGATIVE
Glucose, UA: >=500 mg/dL — AB
KETONES UR: 20 mg/dL — AB
Nitrite: POSITIVE — AB
PH: 6 (ref 5.0–8.0)
Protein, ur: 100 mg/dL — AB
SQUAMOUS EPITHELIAL / LPF: NONE SEEN
Specific Gravity, Urine: 1.009 (ref 1.005–1.030)

## 2017-04-26 LAB — PREGNANCY, URINE: Preg Test, Ur: NEGATIVE

## 2017-04-26 MED ORDER — KETOROLAC TROMETHAMINE 60 MG/2ML IM SOLN
60.0000 mg | Freq: Once | INTRAMUSCULAR | Status: AC
  Administered 2017-04-26: 60 mg via INTRAMUSCULAR
  Filled 2017-04-26: qty 2

## 2017-04-26 MED ORDER — CEPHALEXIN 500 MG PO CAPS: 500.0000 mg | ORAL_CAPSULE | Freq: Three times a day (TID) | ORAL | 0 refills | Status: DC

## 2017-04-26 MED ORDER — CEPHALEXIN 500 MG PO CAPS
500.0000 mg | ORAL_CAPSULE | Freq: Once | ORAL | Status: AC
  Administered 2017-04-26: 500 mg via ORAL
  Filled 2017-04-26: qty 1

## 2017-04-26 MED ORDER — TRAMADOL HCL 50 MG PO TABS: 50.0000 mg | ORAL_TABLET | Freq: Four times a day (QID) | ORAL | 0 refills | Status: DC | PRN

## 2017-04-26 MED ORDER — ONDANSETRON HCL 4 MG PO TABS: 4.0000 mg | ORAL_TABLET | Freq: Four times a day (QID) | ORAL | 0 refills | Status: DC

## 2017-04-26 MED ORDER — IBUPROFEN 600 MG PO TABS: 600.0000 mg | ORAL_TABLET | Freq: Four times a day (QID) | ORAL | 0 refills | Status: DC | PRN

## 2017-04-26 MED ORDER — ONDANSETRON 4 MG PO TBDP
4.0000 mg | ORAL_TABLET | Freq: Once | ORAL | Status: AC
  Administered 2017-04-26: 4 mg via ORAL
  Filled 2017-04-26: qty 1

## 2017-04-26 NOTE — ED Provider Notes (Signed)
[facility] DEPT Provider Note   CSN: [id number] Arrival date & time: 04/26/17  1141     History   Chief Complaint Chief Complaint  Patient presents with  . Dysuria    HPI Shari Collins is a 41 y.o. female.  HPI Patient states she's had roughly one week of dysuria, frequency, urgency. States the symptoms are similar to prior urinary tract infections. She's been taking over-the-counter Azo with no relief. Developed left flank tenderness, fever to 101, chills and nausea starting yesterday. Denies any abdominal pain. No diarrhea or constipation. Has regular periods and denies any current vaginal bleeding or discharge. Last menstrual cycle was earlier this month. Past Medical History:  Diagnosis Date  . Asthma   . Depression   . Diabetes mellitus   . Hypertension   . Noncompliance     Patient Active Problem List   Diagnosis Date Noted  . Hyperlipidemia 10/07/2015  . Breast abscess 10/02/2015  . Schizophrenia ([place]) 10/02/2015  . Bipolar disorder, unspecified ([place]) 10/02/2015  . Uncontrolled type 2 diabetes mellitus with complication ([place]) 92/07/9416  . Personal history of noncompliance with medical treatment, presenting hazards to health 09/24/2015    Past Surgical History:  Procedure Laterality Date  . CESAREAN SECTION    . TONSILLECTOMY      OB History    Gravida Para Term Preterm AB Living   4 1 1   2 1    SAB TAB Ectopic Multiple Live Births   2               Home Medications    Prior to Admission medications   Medication Sig Start Date End Date Taking? Authorizing Provider  albuterol (PROVENTIL HFA;VENTOLIN HFA) 108 (90 BASE) MCG/ACT inhaler Inhale 2 puffs into the lungs every 6 (six) hours as needed for wheezing or shortness of breath.   Yes [provider]  cephALEXin (KEFLEX) 500 MG capsule Take 1 capsule (500 mg total) by mouth 3 (three) times daily. 04/26/17   Julianne Rice, MD  ibuprofen (ADVIL,MOTRIN) 600 MG tablet Take 1 tablet (600  mg total) by mouth every 6 (six) hours as needed. 04/26/17   Julianne Rice, MD  metFORMIN (GLUCOPHAGE) 500 MG tablet Take 1 tablet (500 mg total) by mouth 2 (two) times daily with a meal. 04/29/17   Soyla Dryer, PA-C  promethazine (PHENERGAN) 25 MG tablet 1/2 - 1 po q 8 hour prn n/v 04/29/17   Soyla Dryer, PA-C    Family History History reviewed. No pertinent family history.  Social History Social History  Substance Use Topics  . Smoking status: Current Every Day Smoker    Packs/day: 0.50    Types: Cigarettes  . Smokeless tobacco: Never Used  . Alcohol use No     Allergies   Bee venom; Latex; Reglan [metoclopramide]; and Sulfa antibiotics   Review of Systems Review of Systems  Constitutional: Positive for chills, fatigue and fever.  Respiratory: Negative for cough and shortness of breath.   Cardiovascular: Negative for chest pain.  Gastrointestinal: Positive for nausea. Negative for abdominal pain, constipation, diarrhea and vomiting.  Genitourinary: Positive for dysuria, flank pain, frequency and urgency. Negative for hematuria, pelvic pain, vaginal bleeding and vaginal discharge.  Musculoskeletal: Positive for back pain and myalgias. Negative for neck pain and neck stiffness.  Neurological: Negative for dizziness, weakness, light-headedness, numbness and headaches.  All other systems reviewed and are negative.    Physical Exam Updated Vital Signs BP 103/63 (BP Location: Left Arm)   Pulse  75   Temp 98.2 F (36.8 C) (Oral)   Resp 16   Ht 5\' 6"  (1.676 m)   Wt 84.8 kg (187 lb)   LMP 04/06/2017   SpO2 99%   BMI 30.18 kg/m   Physical Exam  Constitutional: She is oriented to person, place, and time. She appears well-developed and well-nourished. No distress.  HENT:  Head: Normocephalic and atraumatic.  Mouth/Throat: Oropharynx is clear and moist. No oropharyngeal exudate.  Eyes: Pupils are equal, round, and reactive to light. EOM are normal.  Neck: Normal  range of motion. Neck supple.  Cardiovascular: Normal rate and regular rhythm.  Exam reveals no gallop and no friction rub.   No murmur heard. Pulmonary/Chest: Effort normal and breath sounds normal. No respiratory distress. She has no wheezes. She has no rales. She exhibits no tenderness.  Abdominal: Soft. Bowel sounds are normal. There is no tenderness. There is no rebound and no guarding.  Musculoskeletal: Normal range of motion. She exhibits tenderness. She exhibits no edema.  Patient has left-sided CVA tenderness. No midline thoracic or lumbar tenderness. Distal pulses intact. No lower extremity swelling, asymmetry or tenderness.  Neurological: She is alert and oriented to person, place, and time.  Moving all extremities without focal deficit. Sensation fully intact.  Skin: Skin is warm and dry. No rash noted. No erythema.  Psychiatric: She has a normal mood and affect. Her behavior is normal.  Nursing note and vitals reviewed.    ED Treatments / Results  Labs (all labs ordered are listed, but only abnormal results are displayed) Labs Reviewed  URINE CULTURE - Abnormal; Notable for the following:       Result Value   Culture >=100,000 COLONIES/mL ESCHERICHIA COLI (*)    Organism ID, Bacteria ESCHERICHIA COLI (*)    All other components within normal limits  URINALYSIS, ROUTINE W REFLEX MICROSCOPIC - Abnormal; Notable for the following:    Color, Urine AMBER (*)    APPearance TURBID (*)    Glucose, UA >=500 (*)    Hgb urine dipstick LARGE (*)    Ketones, ur 20 (*)    Protein, ur 100 (*)    Nitrite POSITIVE (*)    Leukocytes, UA MODERATE (*)    Bacteria, UA RARE (*)    All other components within normal limits  PREGNANCY, URINE    EKG  EKG Interpretation None       Radiology No results found.  Procedures Procedures (including critical care time)  Medications Ordered in ED Medications  ketorolac (TORADOL) injection 60 mg (60 mg Intramuscular Given 04/26/17 1250)    ondansetron (ZOFRAN-ODT) disintegrating tablet 4 mg (4 mg Oral Given 04/26/17 1250)  cephALEXin (KEFLEX) capsule 500 mg (500 mg Oral Given 04/26/17 1254)     Initial Impression / Assessment and Plan / ED Course  I have reviewed the triage vital signs and the nursing notes.  Pertinent labs & imaging results that were available during my care of the patient were reviewed by me and considered in my medical decision making (see chart for details).    Appears to have early onset pyelonephritis. Urine has been sent for culture. Patient continues to be very well-appearing. Return precautions given.   Final Clinical Impressions(s) / ED Diagnoses   Final diagnoses:  Pyelonephritis    New Prescriptions Discharge Medication List as of 04/26/2017  2:12 PM    START taking these medications   Details  cephALEXin (KEFLEX) 500 MG capsule Take 1 capsule (500 mg total) by  mouth 3 (three) times daily., Starting Mon 04/26/2017, Print    ondansetron (ZOFRAN) 4 MG tablet Take 1 tablet (4 mg total) by mouth every 6 (six) hours., Starting Mon 04/26/2017, Print    traMADol (ULTRAM) 50 MG tablet Take 1 tablet (50 mg total) by mouth every 6 (six) hours as needed for severe pain., Starting Mon 04/26/2017, Print         Julianne Rice, MD 04/30/17 [phone]

## 2017-04-26 NOTE — ED Notes (Signed)
Pt reports taking AZO. Urine brown and cloudy

## 2017-04-26 NOTE — ED Triage Notes (Signed)
Patient complaining of lower back pain, painful urination, vomiting, and fever since yesterday. States she took ibuprofen at 1000 today.

## 2017-04-29 ENCOUNTER — Ambulatory Visit: Payer: Self-pay | Admitting: Physician Assistant

## 2017-04-29 ENCOUNTER — Encounter: Payer: Self-pay | Admitting: Physician Assistant

## 2017-04-29 VITALS — BP 124/80 | HR 92 | Temp 97.7°F | Ht 66.0 in | Wt 192.0 lb

## 2017-04-29 DIAGNOSIS — F17219 Nicotine dependence, cigarettes, with unspecified nicotine-induced disorders: Secondary | ICD-10-CM

## 2017-04-29 DIAGNOSIS — N12 Tubulo-interstitial nephritis, not specified as acute or chronic: Secondary | ICD-10-CM

## 2017-04-29 DIAGNOSIS — E669 Obesity, unspecified: Secondary | ICD-10-CM

## 2017-04-29 DIAGNOSIS — R110 Nausea: Secondary | ICD-10-CM

## 2017-04-29 DIAGNOSIS — E1165 Type 2 diabetes mellitus with hyperglycemia: Secondary | ICD-10-CM

## 2017-04-29 DIAGNOSIS — F39 Unspecified mood [affective] disorder: Secondary | ICD-10-CM

## 2017-04-29 DIAGNOSIS — IMO0002 Reserved for concepts with insufficient information to code with codable children: Secondary | ICD-10-CM

## 2017-04-29 DIAGNOSIS — Z1239 Encounter for other screening for malignant neoplasm of breast: Secondary | ICD-10-CM

## 2017-04-29 DIAGNOSIS — Z794 Long term (current) use of insulin: Principal | ICD-10-CM

## 2017-04-29 DIAGNOSIS — E118 Type 2 diabetes mellitus with unspecified complications: Principal | ICD-10-CM

## 2017-04-29 DIAGNOSIS — E785 Hyperlipidemia, unspecified: Secondary | ICD-10-CM

## 2017-04-29 LAB — URINE CULTURE

## 2017-04-29 LAB — GLUCOSE, POCT (MANUAL RESULT ENTRY): POC Glucose: 238 mg/dl — AB (ref 70–99)

## 2017-04-29 MED ORDER — METFORMIN HCL 500 MG PO TABS: 500.0000 mg | ORAL_TABLET | Freq: Two times a day (BID) | ORAL | 1 refills | Status: DC

## 2017-04-29 MED ORDER — PROMETHAZINE HCL 25 MG PO TABS: ORAL_TABLET | ORAL | 0 refills | Status: DC

## 2017-04-29 NOTE — Progress Notes (Signed)
BP 124/80 (BP Location: Left Arm, Patient Position: Sitting, Cuff Size: Large)   Pulse 92   Temp 97.7 F (36.5 C) (Other (Comment))   Ht 5\' 6"  (1.676 m)   Wt 192 lb (87.1 kg)   LMP 04/29/2017 (Exact Date)   SpO2 96%   BMI 30.99 kg/m    Subjective:    Patient ID: Shari Collins, female    DOB: 05/13/1976, 41 y.o.   MRN: [id number]  HPI: Shari Collins is a 41 y.o. female presenting on 04/29/2017 for New Patient (Initial Visit) (former pt [facility])   HPI  Chief Complaint  Patient presents with  . New Patient (Initial Visit)    former pt [facility]    Pt last seen here January 2017.  She was dismissed for chronic non-compliance and multiple missed appointments  Pt is working at [employer].   Pt with long hx noncompliance  Pt has been on no meds for her dm since she was here jan 2017.  Last a1c was 10.5 and triglycerides 806.   Pt feels bad due to the urine infection.  She says she felt bad before that also.    She has problems with her antibiotic causing nausea/vomiting.  She says she couldn't afford the nausea med given to her by ER doctor  She started back with [facility] about 2 weeks ago  Pt has lost about 30 pounds since last seen here.  She says she isn't eating much because she'll "be asleep"  Pt says she is doing okay without inhalers  Relevant past medical, surgical, family and social history reviewed and updated as indicated. Interim medical history since our last visit reviewed. Allergies and medications reviewed and updated.   Current Outpatient Prescriptions:  .  albuterol (PROVENTIL HFA;VENTOLIN HFA) 108 (90 BASE) MCG/ACT inhaler, Inhale 2 puffs into the lungs every 6 (six) hours as needed for wheezing or shortness of breath., Disp: , Rfl:  .  cephALEXin (KEFLEX) 500 MG capsule, Take 1 capsule (500 mg total) by mouth 3 (three) times daily., Disp: 30 capsule, Rfl: 0 .  ibuprofen (ADVIL,MOTRIN) 600 MG tablet, Take 1 tablet (600 mg total) by mouth every 6  (six) hours as needed., Disp: 30 tablet, Rfl: 0   Review of Systems  Constitutional: Positive for appetite change, fatigue and unexpected weight change. Negative for chills, diaphoresis and fever.  HENT: Negative for congestion, drooling, ear pain, facial swelling, hearing loss, mouth sores, sneezing, sore throat, trouble swallowing and voice change.   Eyes: Positive for redness and itching. Negative for pain, discharge and visual disturbance.  Respiratory: Negative for cough, choking, shortness of breath and wheezing.   Cardiovascular: Negative for chest pain, palpitations and leg swelling.  Gastrointestinal: Positive for abdominal pain and vomiting. Negative for blood in stool, constipation and diarrhea.  Endocrine: Negative for cold intolerance, heat intolerance and polydipsia.  Genitourinary: Positive for dysuria and hematuria. Negative for decreased urine volume.  Musculoskeletal: Negative for arthralgias, back pain and gait problem.  Skin: Positive for rash.  Allergic/Immunologic: Positive for environmental allergies.  Neurological: Positive for headaches. Negative for seizures, syncope and light-headedness.  Hematological: Negative for adenopathy.  Psychiatric/Behavioral: Positive for dysphoric mood. Negative for agitation and suicidal ideas. The patient is nervous/anxious.     Per HPI unless specifically indicated above     Objective:    BP 124/80 (BP Location: Left Arm, Patient Position: Sitting, Cuff Size: Large)   Pulse 92   Temp 97.7 F (36.5 C) (Other (  Comment))   Ht 5\' 6"  (1.676 m)   Wt 192 lb (87.1 kg)   LMP 04/29/2017 (Exact Date)   SpO2 96%   BMI 30.99 kg/m   Wt Readings from Last 3 Encounters:  04/29/17 192 lb (87.1 kg)  04/26/17 187 lb (84.8 kg)  04/12/17 200 lb (90.7 kg)    Physical Exam  Constitutional: She is oriented to person, place, and time. She appears well-developed and well-nourished.  HENT:  Head: Normocephalic and atraumatic.  Mouth/Throat:  Oropharynx is clear and moist. No oropharyngeal exudate.  Eyes: Pupils are equal, round, and reactive to light. Conjunctivae and EOM are normal.  Neck: Neck supple. No thyromegaly present.  Cardiovascular: Normal rate and regular rhythm.   Pulmonary/Chest: Effort normal and breath sounds normal.  Abdominal: Soft. Bowel sounds are normal. She exhibits no distension, no ascites and no mass. There is no hepatosplenomegaly. There is no tenderness. There is no rigidity, no guarding and no CVA tenderness.  Musculoskeletal: She exhibits no edema.  Lymphadenopathy:    She has no cervical adenopathy.  Neurological: She is alert and oriented to person, place, and time. Gait normal.  Skin: Skin is warm and dry.  Psychiatric: She has a normal mood and affect. Her behavior is normal.  Vitals reviewed.    Random bs 238       Assessment & Plan:   Encounter Diagnoses  Name Primary?  Marland Kitchen Uncontrolled type 2 diabetes mellitus with complication, with long-term current use of insulin ([place]) Yes  . Hyperlipidemia, unspecified hyperlipidemia type   . Pyelonephritis   . Nausea   . Cigarette nicotine dependence with nicotine-induced disorder   . Mood disorder ([place])   . Screening for breast cancer   . Obesity, unspecified classification, unspecified obesity type, unspecified whether serious comorbidity present      -Pt to get fasting labs drawn tomorrow  -rx metformin 500 bid -rx phenergan (nausea due to UTI) -pt to finish antibiotics given in the ER - order screening Mammogram -pt to continue with [facility] for mood disorder -pt to F/u 4 wk.  RTO sooner prn

## 2017-04-30 ENCOUNTER — Telehealth: Payer: Self-pay | Admitting: *Deleted

## 2017-04-30 ENCOUNTER — Other Ambulatory Visit (HOSPITAL_COMMUNITY)
Admission: RE | Admit: 2017-04-30 | Discharge: 2017-04-30 | Disposition: A | Discharge status: Home / Self Care | Payer: Self-pay | Source: Ambulatory Visit | Attending: Physician Assistant | Admitting: Physician Assistant

## 2017-04-30 DIAGNOSIS — IMO0002 Reserved for concepts with insufficient information to code with codable children: Secondary | ICD-10-CM

## 2017-04-30 DIAGNOSIS — E785 Hyperlipidemia, unspecified: Secondary | ICD-10-CM | POA: Insufficient documentation

## 2017-04-30 DIAGNOSIS — E118 Type 2 diabetes mellitus with unspecified complications: Secondary | ICD-10-CM | POA: Insufficient documentation

## 2017-04-30 DIAGNOSIS — Z794 Long term (current) use of insulin: Secondary | ICD-10-CM | POA: Insufficient documentation

## 2017-04-30 DIAGNOSIS — E1165 Type 2 diabetes mellitus with hyperglycemia: Secondary | ICD-10-CM | POA: Insufficient documentation

## 2017-04-30 LAB — COMPREHENSIVE METABOLIC PANEL
ALK PHOS: 79 U/L (ref 38–126)
ALT: 15 U/L (ref 14–54)
AST: 18 U/L (ref 15–41)
Albumin: 3.2 g/dL — ABNORMAL LOW (ref 3.5–5.0)
Anion gap: 11 (ref 5–15)
BILIRUBIN TOTAL: 0.7 mg/dL (ref 0.3–1.2)
BUN: 15 mg/dL (ref 6–20)
CALCIUM: 8.7 mg/dL — AB (ref 8.9–10.3)
CO2: 24 mmol/L (ref 22–32)
CREATININE: 0.7 mg/dL (ref 0.44–1.00)
Chloride: 100 mmol/L — ABNORMAL LOW (ref 101–111)
Glucose, Bld: 172 mg/dL — ABNORMAL HIGH (ref 65–99)
Potassium: 3.2 mmol/L — ABNORMAL LOW (ref 3.5–5.1)
Sodium: 135 mmol/L (ref 135–145)
TOTAL PROTEIN: 6.9 g/dL (ref 6.5–8.1)

## 2017-04-30 LAB — LIPID PANEL
CHOLESTEROL: 177 mg/dL (ref 0–200)
HDL: 17 mg/dL — AB (ref >40)
LDL CALC: 90 mg/dL (ref 0–99)
TRIGLYCERIDES: 348 mg/dL — AB (ref <150)
Total CHOL/HDL Ratio: 10.4 RATIO
VLDL: 70 mg/dL — ABNORMAL HIGH (ref 0–40)

## 2017-04-30 LAB — HEMOGLOBIN A1C
HEMOGLOBIN A1C: 10.5 % — AB (ref 4.8–5.6)
Mean Plasma Glucose: 254.65 mg/dL

## 2017-04-30 NOTE — Telephone Encounter (Signed)
Post ED Visit - Positive Culture Follow-up  Culture report reviewed by antimicrobial stewardship pharmacist:  []  Elenor Quinones, Pharm.D. []  Heide Guile, Pharm.D., BCPS AQ-ID []  Parks Neptune, Pharm.D., BCPS []  Alycia Rossetti, Pharm.D., BCPS []  [place], [place].D., BCPS, AAHIVP []  Legrand Como, Pharm.D., BCPS, AAHIVP []  Salome Arnt, PharmD, BCPS []  Dimitri Ped, PharmD, BCPS []  Vincenza Hews, PharmD, BCPS Jimmy Footman, Pharm D  Positive urine culture Treated with Ce[phalexin, organism sensitive to the same and no further patient follow-up is required at this time.  Harlon Flor [facility] 04/30/2017, 10:21 AM

## 2017-05-01 LAB — MICROALBUMIN, URINE: MICROALB UR: 306.1 ug/mL — AB

## 2017-05-31 ENCOUNTER — Encounter: Payer: Self-pay | Admitting: Physician Assistant

## 2017-05-31 ENCOUNTER — Ambulatory Visit: Payer: Self-pay | Admitting: Physician Assistant

## 2017-05-31 ENCOUNTER — Other Ambulatory Visit (HOSPITAL_COMMUNITY)
Admission: RE | Admit: 2017-05-31 | Discharge: 2017-05-31 | Disposition: A | Discharge status: Home / Self Care | Payer: Self-pay | Source: Ambulatory Visit | Attending: Physician Assistant | Admitting: Physician Assistant

## 2017-05-31 VITALS — BP 134/80 | HR 75 | Temp 97.9°F | Ht 66.0 in | Wt 197.0 lb

## 2017-05-31 DIAGNOSIS — F17219 Nicotine dependence, cigarettes, with unspecified nicotine-induced disorders: Secondary | ICD-10-CM

## 2017-05-31 DIAGNOSIS — E876 Hypokalemia: Secondary | ICD-10-CM

## 2017-05-31 DIAGNOSIS — J449 Chronic obstructive pulmonary disease, unspecified: Secondary | ICD-10-CM

## 2017-05-31 DIAGNOSIS — Z794 Long term (current) use of insulin: Principal | ICD-10-CM

## 2017-05-31 DIAGNOSIS — IMO0002 Reserved for concepts with insufficient information to code with codable children: Secondary | ICD-10-CM

## 2017-05-31 DIAGNOSIS — E1165 Type 2 diabetes mellitus with hyperglycemia: Secondary | ICD-10-CM

## 2017-05-31 DIAGNOSIS — E785 Hyperlipidemia, unspecified: Secondary | ICD-10-CM

## 2017-05-31 DIAGNOSIS — E118 Type 2 diabetes mellitus with unspecified complications: Principal | ICD-10-CM

## 2017-05-31 DIAGNOSIS — R87612 Low grade squamous intraepithelial lesion on cytologic smear of cervix (LGSIL): Secondary | ICD-10-CM

## 2017-05-31 LAB — POTASSIUM: Potassium: 4.4 mmol/L (ref 3.5–5.1)

## 2017-05-31 MED ORDER — FISH OIL 1000 MG PO CAPS: 1.0000 | ORAL_CAPSULE | Freq: Two times a day (BID) | ORAL | 0 refills | Status: DC

## 2017-05-31 MED ORDER — SITAGLIPTIN PHOSPHATE 100 MG PO TABS: 100.0000 mg | ORAL_TABLET | Freq: Every day | ORAL | 1 refills | Status: DC

## 2017-05-31 MED ORDER — METFORMIN HCL 1000 MG PO TABS: 1000.0000 mg | ORAL_TABLET | Freq: Two times a day (BID) | ORAL | 4 refills | Status: DC

## 2017-05-31 NOTE — Patient Instructions (Addendum)
Fat and Cholesterol Restricted Diet High levels of fat and cholesterol in your blood may lead to various health problems, such as diseases of the heart, blood vessels, gallbladder, liver, and pancreas. Fats are concentrated sources of energy that come in various forms. Certain types of fat, including saturated fat, may be harmful in excess. Cholesterol is a substance needed by your body in small amounts. Your body makes all the cholesterol it needs. Excess cholesterol comes from the food you eat. When you have high levels of cholesterol and saturated fat in your blood, health problems can develop because the excess fat and cholesterol will gather along the walls of your blood vessels, causing them to narrow. Choosing the right foods will help you control your intake of fat and cholesterol. This will help keep the levels of these substances in your blood within normal limits and reduce your risk of disease. What is my plan? Your health care provider recommends that you:  Limit your fat intake to ______% or less of your total calories per day.  Limit the amount of cholesterol in your diet to less than _________mg per day.  Eat 20-30 grams of fiber each day.  What types of fat should I choose?  Choose healthy fats more often. Choose monounsaturated and polyunsaturated fats, such as olive and canola oil, flaxseeds, walnuts, almonds, and seeds.  Eat more omega-3 fats. Good choices include salmon, mackerel, sardines, tuna, flaxseed oil, and ground flaxseeds. Aim to eat fish at least two times a week.  Limit saturated fats. Saturated fats are primarily found in animal products, such as meats, butter, and cream. Plant sources of saturated fats include palm oil, palm kernel oil, and coconut oil.  Avoid foods with partially hydrogenated oils in them. These contain trans fats. Examples of foods that contain trans fats are stick margarine, some tub margarines, cookies, crackers, and other baked goods. What  general guidelines do I need to follow? These guidelines for healthy eating will help you control your intake of fat and cholesterol:  Check food labels carefully to identify foods with trans fats or high amounts of saturated fat.  Fill one half of your plate with vegetables and green salads.  Fill one fourth of your plate with whole grains. Look for the word "whole" as the first word in the ingredient list.  Fill one fourth of your plate with lean protein foods.  Limit fruit to two servings a day. Choose fruit instead of juice.  Eat more foods that contain fiber, such as apples, broccoli, carrots, beans, peas, and barley.  Eat more home-cooked food and less restaurant, buffet, and fast food.  Limit or avoid alcohol.  Limit foods high in starch and sugar.  Limit fried foods.  Cook foods using methods other than frying. Baking, boiling, grilling, and broiling are all great options.  Lose weight if you are overweight. Losing just 5-10% of your initial body weight can help your overall health and prevent diseases such as diabetes and heart disease.  What foods can I eat? Grains  Whole grains, such as whole wheat or whole grain breads, crackers, cereals, and pasta. Unsweetened oatmeal, bulgur, barley, quinoa, or brown rice. Corn or whole wheat flour tortillas. Vegetables  Fresh or frozen vegetables (raw, steamed, roasted, or grilled). Green salads. Fruits  All fresh, canned (in natural juice), or frozen fruits. Meats and other protein foods  Ground beef (85% or leaner), grass-fed beef, or beef trimmed of fat. Skinless chicken or turkey. Ground chicken or turkey.   Pork trimmed of fat. All fish and seafood. Eggs. Dried beans, peas, or lentils. Unsalted nuts or seeds. Unsalted canned or dry beans. Dairy  Low-fat dairy products, such as skim or 1% milk, 2% or reduced-fat cheeses, low-fat ricotta or cottage cheese, or plain low-fat yo Fats and oils  Tub margarines without trans  fats. Light or reduced-fat mayonnaise and salad dressings. Avocado. Olive, canola, sesame, or safflower oils. Natural peanut or almond butter (choose ones without added sugar and oil). The items listed above may not be a complete list of recommended foods or beverages. Contact your dietitian for more options. Foods to avoid Grains  White bread. White pasta. White rice. Cornbread. Bagels, pastries, and croissants. Crackers that contain trans fat. Vegetables  White potatoes. Corn. Creamed or fried vegetables. Vegetables in a cheese sauce. Fruits  Dried fruits. Canned fruit in light or heavy syrup. Fruit juice. Meats and other protein foods  Fatty cuts of meat. Ribs, chicken wings, bacon, sausage, bologna, salami, chitterlings, fatback, hot dogs, bratwurst, and packaged luncheon meats. Liver and organ meats. Dairy  Whole or 2% milk, cream, half-and-half, and cream cheese. Whole milk cheeses. Whole-fat or sweetened yogurt. Full-fat cheeses. Nondairy creamers and whipped toppings. Processed cheese, cheese spreads, or cheese curds. Beverages  Alcohol. Sweetened drinks (such as sodas, lemonade, and fruit drinks or punches). Fats and oils  Butter, stick margarine, lard, shortening, ghee, or bacon fat. Coconut, palm kernel, or palm oils. Sweets and desserts  Corn syrup, sugars, honey, and molasses. Candy. Jam and jelly. Syrup. Sweetened cereals. Cookies, pies, cakes, donuts, muffins, and ice cream. The items listed above may not be a complete list of foods and beverages to avoid. Contact your dietitian for more information. This information is not intended to replace advice given to you by your health care provider. Make sure you discuss any questions you have with your health care provider. Document Released: 08/24/2005 Document Revised: 09/14/2014 Document Reviewed: 11/22/2013 Elsevier Interactive Patient Education  2017 [place].     Diabetes Mellitus and Food It is important for  you to manage your blood sugar (glucose) level. Your blood glucose level can be greatly affected by what you eat. Eating healthier foods in the appropriate amounts throughout the day at about the same time each day will help you control your blood glucose level. It can also help slow or prevent worsening of your diabetes mellitus. Healthy eating may even help you improve the level of your blood pressure and reach or maintain a healthy weight. General recommendations for healthful eating and cooking habits include:  Eating meals and snacks regularly. Avoid going long periods of time without eating to lose weight.  Eating a diet that consists mainly of plant-based foods, such as fruits, vegetables, nuts, legumes, and whole grains.  Using low-heat cooking methods, such as baking, instead of high-heat cooking methods, such as deep frying.  Work with your dietitian to make sure you understand how to use the Nutrition Facts information on food labels. How can food affect me? Carbohydrates Carbohydrates affect your blood glucose level more than any other type of food. Your dietitian will help you determine how many carbohydrates to eat at each meal and teach you how to count carbohydrates. Counting carbohydrates is important to keep your blood glucose at a healthy level, especially if you are using insulin or taking certain medicines for diabetes mellitus. Alcohol Alcohol can cause sudden decreases in blood glucose (hypoglycemia), especially if you use insulin or take certain medicines for diabetes mellitus.  Hypoglycemia can be a life-threatening condition. Symptoms of hypoglycemia (sleepiness, dizziness, and disorientation) are similar to symptoms of having too much alcohol. If your health care provider has given you approval to drink alcohol, do so in moderation and use the following guidelines:  Women should not have more than one drink per day, and men should not have more than two drinks per day. One  drink is equal to: ? 12 oz of beer. ? 5 oz of wine. ? 1 oz of hard liquor.  Do not drink on an empty stomach.  Keep yourself hydrated. Have water, diet soda, or unsweetened iced tea.  Regular soda, juice, and other mixers might contain a lot of carbohydrates and should be counted.  What foods are not recommended? As you make food choices, it is important to remember that all foods are not the same. Some foods have fewer nutrients per serving than other foods, even though they might have the same number of calories or carbohydrates. It is difficult to get your body what it needs when you eat foods with fewer nutrients. Examples of foods that you should avoid that are high in calories and carbohydrates but low in nutrients include:  Trans fats (most processed foods list trans fats on the Nutrition Facts label).  Regular soda.  Juice.  Candy.  Sweets, such as cake, pie, doughnuts, and cookies.  Fried foods.  What foods can I eat? Eat nutrient-rich foods, which will nourish your body and keep you healthy. The food you should eat also will depend on several factors, including:  The calories you need.  The medicines you take.  Your weight.  Your blood glucose level.  Your blood pressure level.  Your cholesterol level.  You should eat a variety of foods, including:  Protein. ? Lean cuts of meat. ? Proteins low in saturated fats, such as fish, egg whites, and beans. Avoid processed meats.  Fruits and vegetables. ? Fruits and vegetables that may help control blood glucose levels, such as apples, mangoes, and yams.  Dairy products. ? Choose fat-free or low-fat dairy products, such as milk, yogurt, and cheese.  Grains, bread, pasta, and rice. ? Choose whole grain products, such as multigrain bread, whole oats, and brown rice. These foods may help control blood pressure.  Fats. ? Foods containing healthful fats, such as nuts, avocado, olive oil, canola oil, and  fish.  Does everyone with diabetes mellitus have the same meal plan? Because every person with diabetes mellitus is different, there is not one meal plan that works for everyone. It is very important that you meet with a dietitian who will help you create a meal plan that is just right for you. This information is not intended to replace advice given to you by your health care provider. Make sure you discuss any questions you have with your health care provider. Document Released: 05/21/2005 Document Revised: 01/30/2016 Document Reviewed: 07/21/2013 Elsevier Interactive Patient Education  2017 [employer].

## 2017-05-31 NOTE — Progress Notes (Signed)
BP 134/80 (BP Location: Left Arm, Patient Position: Sitting, Cuff Size: Normal)   Pulse 75   Temp 97.9 F (36.6 C)   Ht 5\' 6"  (1.676 m)   Wt 197 lb (89.4 kg)   SpO2 98%   BMI 31.80 kg/m    Subjective:    Patient ID: Shari Collins, female    DOB: May 12, 1976, 41 y.o.   MRN: [id number]  HPI: Shari Collins is a 41 y.o. female presenting on 05/31/2017 for Diabetes   HPI  Discussed PAP that was done earlier this year at [facility]. Pt was supposed to f/u but never did  Relevant past medical, surgical, family and social history reviewed and updated as indicated. Interim medical history since our last visit reviewed. Allergies and medications reviewed and updated.   Current Outpatient Prescriptions:  .  albuterol (PROVENTIL HFA;VENTOLIN HFA) 108 (90 BASE) MCG/ACT inhaler, Inhale 2 puffs into the lungs every 6 (six) hours as needed for wheezing or shortness of breath., Disp: , Rfl:  .  metFORMIN (GLUCOPHAGE) 500 MG tablet, Take 1 tablet (500 mg total) by mouth 2 (two) times daily with a meal., Disp: 60 tablet, Rfl: 1 .  Multiple Vitamin (MULTIVITAMIN) tablet, Take 1 tablet by mouth daily., Disp: , Rfl:    Review of Systems  Constitutional: Negative for appetite change, chills, diaphoresis, fatigue, fever and unexpected weight change.  HENT: Negative for congestion, drooling, ear pain, facial swelling, hearing loss, mouth sores, sneezing, sore throat, trouble swallowing and voice change.   Eyes: Negative for pain, discharge, redness, itching and visual disturbance.  Respiratory: Negative for cough, choking, shortness of breath and wheezing.   Cardiovascular: Negative for chest pain, palpitations and leg swelling.  Gastrointestinal: Negative for abdominal pain, blood in stool, constipation, diarrhea and vomiting.  Endocrine: Negative for cold intolerance, heat intolerance and polydipsia.  Genitourinary: Negative for decreased urine volume, dysuria and hematuria.  Musculoskeletal:  Positive for back pain. Negative for arthralgias and gait problem.  Skin: Negative for rash.  Allergic/Immunologic: Negative for environmental allergies.  Neurological: Negative for seizures, syncope, light-headedness and headaches.  Hematological: Negative for adenopathy.  Psychiatric/Behavioral: Positive for dysphoric mood and suicidal ideas. Negative for agitation. The patient is nervous/anxious.     Per HPI unless specifically indicated above     Objective:    BP 134/80 (BP Location: Left Arm, Patient Position: Sitting, Cuff Size: Normal)   Pulse 75   Temp 97.9 F (36.6 C)   Ht 5\' 6"  (1.676 m)   Wt 197 lb (89.4 kg)   SpO2 98%   BMI 31.80 kg/m   Wt Readings from Last 3 Encounters:  05/31/17 197 lb (89.4 kg)  04/29/17 192 lb (87.1 kg)  04/26/17 187 lb (84.8 kg)    Physical Exam  Constitutional: She is oriented to person, place, and time. She appears well-developed and well-nourished.  HENT:  Head: Normocephalic and atraumatic.  Neck: Neck supple.  Cardiovascular: Normal rate and regular rhythm.   Pulmonary/Chest: Effort normal and breath sounds normal.  Abdominal: Soft. Bowel sounds are normal. She exhibits no mass. There is no hepatosplenomegaly. There is no tenderness.  Musculoskeletal: She exhibits no edema.  Lymphadenopathy:    She has no cervical adenopathy.  Neurological: She is alert and oriented to person, place, and time.  Skin: Skin is warm and dry.  Psychiatric: She has a normal mood and affect. Her behavior is normal.  Vitals reviewed.   Results for orders placed or performed during the hospital encounter  of 04/30/17  HgB A1c  Result Value Ref Range   Hgb A1c MFr Bld 10.5 (H) 4.8 - 5.6 %   Mean Plasma Glucose 254.65 mg/dL  Lipid Profile  Result Value Ref Range   Cholesterol 177 0 - 200 mg/dL   Triglycerides 348 (H) <150 mg/dL   HDL 17 (L) >40 mg/dL   Total CHOL/HDL Ratio 10.4 RATIO   VLDL 70 (H) 0 - 40 mg/dL   LDL Cholesterol 90 0 - 99 mg/dL   Comprehensive metabolic panel  Result Value Ref Range   Sodium 135 135 - 145 mmol/L   Potassium 3.2 (L) 3.5 - 5.1 mmol/L   Chloride 100 (L) 101 - 111 mmol/L   CO2 24 22 - 32 mmol/L   Glucose, Bld 172 (H) 65 - 99 mg/dL   BUN 15 6 - 20 mg/dL   Creatinine, Ser 0.70 0.44 - 1.00 mg/dL   Calcium 8.7 (L) 8.9 - 10.3 mg/dL   Total Protein 6.9 6.5 - 8.1 g/dL   Albumin 3.2 (L) 3.5 - 5.0 g/dL   AST 18 15 - 41 U/L   ALT 15 14 - 54 U/L   Alkaline Phosphatase 79 38 - 126 U/L   Total Bilirubin 0.7 0.3 - 1.2 mg/dL   GFR calc non Af Amer >60 >60 mL/min   GFR calc Af Amer >60 >60 mL/min   Anion gap 11 5 - 15  Microalbumin, urine  Result Value Ref Range   Microalb, Ur 306.1 (H) Not Estab. ug/mL      Assessment & Plan:    Encounter Diagnoses  Name Primary?  Marland Kitchen Uncontrolled type 2 diabetes mellitus with complication, with long-term current use of insulin ([place]) Yes  . Hypokalemia   . Hyperlipidemia, unspecified hyperlipidemia type   . 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 Cigarette nicotine dependence with nicotine-induced disorder   . Chronic obstructive pulmonary disease, unspecified COPD type ([place])     -Recheck K+ today.  Will call in supplements if still low.  -pt to start Fish oil 2 daily and lowfat diet. -Increase metformin to 1g bid -Add januvia.  -pt Signed up for medassist -Refer gyn for abn PAP -pt was Gave [employer] discount application -pt to follow up 6 wk.   RTO sooner prn

## 2017-06-03 ENCOUNTER — Encounter: Payer: Self-pay | Admitting: Physician Assistant

## 2017-06-29 ENCOUNTER — Encounter: Payer: Self-pay | Admitting: *Deleted

## 2017-07-01 ENCOUNTER — Encounter: Payer: Self-pay | Admitting: Physician Assistant

## 2017-07-01 ENCOUNTER — Ambulatory Visit: Payer: Self-pay | Admitting: Physician Assistant

## 2017-07-01 VITALS — BP 132/88 | HR 70 | Temp 97.9°F | Ht 66.0 in | Wt 199.5 lb

## 2017-07-01 DIAGNOSIS — N63 Unspecified lump in unspecified breast: Secondary | ICD-10-CM

## 2017-07-01 NOTE — Progress Notes (Signed)
BP 132/88 (BP Location: Left Arm, Patient Position: Sitting, Cuff Size: Normal)   Pulse 70   Temp 97.9 F (36.6 C)   Ht 5\' 6"  (1.676 m)   Wt 199 lb 8 oz (90.5 kg)   SpO2 98%   BMI 32.20 kg/m    Subjective:    Patient ID: Shari Collins, female    DOB: [birth year], 41 y.o.   MRN: [id number]  HPI: Shari Collins is a 41 y.o. female presenting on 07/01/2017 for Breast Pain (L breast pain with green discharge. pt states she has taken IBU today and states it has helped with pain.)   HPI   Chief Complaint  Patient presents with  . Breast Pain    L breast pain with green discharge. pt states she has taken IBU today and states it has helped with pain.     Pt states this is same breast that she has had issues with in the past. But is started hurting "bad" today.  Pt Had aspiration jan 2017  Relevant past medical, surgical, family and social history reviewed and updated as indicated. Interim medical history since our last visit reviewed. Allergies and medications reviewed and updated.  Review of Systems  Constitutional: Negative for appetite change, chills, diaphoresis, fatigue, fever and unexpected weight change.  HENT: Negative for congestion, dental problem, drooling, ear pain, facial swelling, hearing loss, mouth sores, sneezing, sore throat, trouble swallowing and voice change.   Eyes: Negative for pain, discharge, redness, itching and visual disturbance.  Respiratory: Negative for cough, choking, shortness of breath and wheezing.   Cardiovascular: Negative for chest pain, palpitations and leg swelling.  Gastrointestinal: Negative for abdominal pain, blood in stool, constipation, diarrhea and vomiting.  Endocrine: Negative for cold intolerance, heat intolerance and polydipsia.  Genitourinary: Negative for decreased urine volume, dysuria and hematuria.  Musculoskeletal: Negative for arthralgias, back pain and gait problem.  Skin: Negative for rash.  Allergic/Immunologic:  Negative for environmental allergies.  Neurological: Negative for seizures, syncope, light-headedness and headaches.  Hematological: Negative for adenopathy.  Psychiatric/Behavioral: Negative for agitation, dysphoric mood and suicidal ideas. The patient is not nervous/anxious.     Per HPI unless specifically indicated above     Objective:    BP 132/88 (BP Location: Left Arm, Patient Position: Sitting, Cuff Size: Normal)   Pulse 70   Temp 97.9 F (36.6 C)   Ht 5\' 6"  (1.676 m)   Wt 199 lb 8 oz (90.5 kg)   SpO2 98%   BMI 32.20 kg/m   Wt Readings from Last 3 Encounters:  07/01/17 199 lb 8 oz (90.5 kg)  05/31/17 197 lb (89.4 kg)  04/29/17 192 lb (87.1 kg)    Physical Exam  Constitutional: She appears well-developed and well-nourished.  HENT:  Head: Normocephalic and atraumatic.  Pulmonary/Chest: Effort normal.  Genitourinary: There is breast discharge. No breast swelling, tenderness or bleeding.  Genitourinary Comments: L breast with approximate 1 cm lump at 11 o'clock position.  There is small amount discharge from the nipple.  There is no redness or tenderness.   Skin: Skin is warm and dry.  Psychiatric: She has a normal mood and affect. Her behavior is normal.  Nursing note and vitals reviewed.       Assessment & Plan:   Encounter Diagnosis  Name Primary?  . Breast mass in female Yes     -Will refer to [facility] program for diagnostic mammogram -pt to follow up here as scheduled next month.  RTO sooner prn

## 2017-07-01 NOTE — Progress Notes (Signed)
Pt called c/o L breast pain that began this morning. Pt states she has a lump on this breast, pt also states having green discharge that she has already been experiencing for years.  Pt was asked to come now in order to see her. Pt states she can't come right now because she is at work. Pt states she gets off work at 54, so pt was asked to be at the office before lunch due to our full schedule today. Pt states she thinks she can be here before lunch time. Pt will be added to the schedule today 07-01-17

## 2017-07-05 ENCOUNTER — Other Ambulatory Visit: Payer: Self-pay | Admitting: Physician Assistant

## 2017-07-05 DIAGNOSIS — Z1231 Encounter for screening mammogram for malignant neoplasm of breast: Secondary | ICD-10-CM

## 2017-07-09 ENCOUNTER — Encounter: Payer: Self-pay | Admitting: Obstetrics and Gynecology

## 2017-07-12 ENCOUNTER — Ambulatory Visit: Payer: Self-pay | Admitting: Physician Assistant

## 2017-07-12 ENCOUNTER — Encounter: Payer: Self-pay | Admitting: Physician Assistant

## 2017-07-12 VITALS — BP 132/80 | HR 94 | Temp 97.9°F | Ht 66.0 in | Wt 201.5 lb

## 2017-07-12 DIAGNOSIS — R350 Frequency of micturition: Secondary | ICD-10-CM

## 2017-07-12 DIAGNOSIS — N3090 Cystitis, unspecified without hematuria: Secondary | ICD-10-CM

## 2017-07-12 LAB — POCT URINALYSIS DIPSTICK
GLUCOSE UA: NEGATIVE
Ketones, UA: NEGATIVE
NITRITE UA: POSITIVE
PROTEIN UA: NEGATIVE
SPEC GRAV UA: 1.015 (ref 1.010–1.025)
UROBILINOGEN UA: 1 U/dL
pH, UA: 7 (ref 5.0–8.0)

## 2017-07-12 MED ORDER — CIPROFLOXACIN HCL 500 MG PO TABS: 500.0000 mg | ORAL_TABLET | Freq: Two times a day (BID) | ORAL | 0 refills | Status: DC

## 2017-07-12 NOTE — Patient Instructions (Signed)

## 2017-07-12 NOTE — Progress Notes (Signed)
BP 132/80 (BP Location: Left Arm, Patient Position: Sitting, Cuff Size: Normal)   Pulse 94   Temp 97.9 F (36.6 C)   Ht 5\' 6"  (1.676 m)   Wt 201 lb 8 oz (91.4 kg)   SpO2 98%   BMI 32.52 kg/m    Subjective:    Patient ID: Shari Collins, female    DOB: [birth year], 41 y.o.   MRN: [id number]  HPI: Shari Collins is a 40 y.o. female presenting on 07/12/2017 for Urinary Frequency (began a week ago. pt states she has been taking azo and feels it has not been helpful. pt states burning after urinating, urinary frequency.)   HPI Chief Complaint  Patient presents with  . Urinary Frequency    began a week ago. pt states she has been taking azo and feels it has not been helpful. pt states burning after urinating, urinary frequency.     No fevers.  + urgency and burning  Pt got appt for diagnostic mammogram  Pt is going to [facility] issues  Relevant past medical, surgical, family and social history reviewed and updated as indicated. Interim medical history since our last visit reviewed. Allergies and medications reviewed and updated.   Current Outpatient Medications:  .  albuterol (PROVENTIL HFA;VENTOLIN HFA) 108 (90 BASE) MCG/ACT inhaler, Inhale 2 puffs into the lungs every 6 (six) hours as needed for wheezing or shortness of breath., Disp: , Rfl:  .  metFORMIN (GLUCOPHAGE) 1000 MG tablet, Take 1 tablet (1,000 mg total) by mouth 2 (two) times daily with a meal., Disp: 60 tablet, Rfl: 4 .  Multiple Vitamin (MULTIVITAMIN) tablet, Take 1 tablet by mouth daily., Disp: , Rfl:  .  Omega-3 Fatty Acids (FISH OIL) 1000 MG CAPS, Take 1 capsule (1,000 mg total) by mouth 2 (two) times daily., Disp: , Rfl: 0 .  sitaGLIPtin (JANUVIA) 100 MG tablet, Take 1 tablet (100 mg total) by mouth daily. (Patient not taking: Reported on 07/01/2017), Disp: 90 tablet, Rfl: 1   Review of Systems  Constitutional: Negative for appetite change, chills, diaphoresis, fatigue, fever and unexpected weight  change.  HENT: Negative for congestion, dental problem, drooling, ear pain, facial swelling, hearing loss, mouth sores, sneezing, sore throat, trouble swallowing and voice change.   Eyes: Negative for pain, discharge, redness, itching and visual disturbance.  Respiratory: Negative for cough, choking, shortness of breath and wheezing.   Cardiovascular: Negative for chest pain, palpitations and leg swelling.  Gastrointestinal: Negative for abdominal pain, blood in stool, constipation, diarrhea and vomiting.  Endocrine: Negative for cold intolerance, heat intolerance and polydipsia.  Genitourinary: Positive for dysuria. Negative for decreased urine volume and hematuria.  Musculoskeletal: Positive for back pain. Negative for arthralgias and gait problem.  Skin: Negative for rash.  Allergic/Immunologic: Negative for environmental allergies.  Neurological: Negative for seizures, syncope, light-headedness and headaches.  Hematological: Negative for adenopathy.  Psychiatric/Behavioral: Positive for dysphoric mood and suicidal ideas. Negative for agitation. The patient is nervous/anxious.     Per HPI unless specifically indicated above     Objective:    BP 132/80 (BP Location: Left Arm, Patient Position: Sitting, Cuff Size: Normal)   Pulse 94   Temp 97.9 F (36.6 C)   Ht 5\' 6"  (1.676 m)   Wt 201 lb 8 oz (91.4 kg)   SpO2 98%   BMI 32.52 kg/m   Wt Readings from Last 3 Encounters:  07/12/17 201 lb 8 oz (91.4 kg)  07/01/17 199 lb 8 oz (  90.5 kg)  05/31/17 197 lb (89.4 kg)    Physical Exam  Constitutional: She is oriented to person, place, and time. She appears well-developed and well-nourished.  HENT:  Head: Normocephalic and atraumatic.  Neck: Neck supple.  Cardiovascular: Normal rate and regular rhythm.  Pulmonary/Chest: Effort normal and breath sounds normal.  Abdominal: Soft. Bowel sounds are normal. She exhibits no mass. There is no hepatosplenomegaly. There is no tenderness.   Musculoskeletal: She exhibits no edema.  Lymphadenopathy:    She has no cervical adenopathy.  Neurological: She is alert and oriented to person, place, and time.  Skin: Skin is warm and dry.  Psychiatric: She has a normal mood and affect. Her behavior is normal.  Vitals reviewed.   Results for orders placed or performed in visit on 07/12/17  POCT Urinalysis Dipstick  Result Value Ref Range   Color, UA ORANGE    Clarity, UA CLOUDY    Glucose, UA NEG    Bilirubin, UA SMALL    Ketones, UA NEG    Spec Grav, UA 1.015 1.010 - 1.025   Blood, UA SMALL    pH, UA 7.0 5.0 - 8.0   Protein, UA NEG    Urobilinogen, UA 1.0 0.2 or 1.0 E.U./dL   Nitrite, UA POS    Leukocytes, UA Small (1+) (A) Negative      Assessment & Plan:    Encounter Diagnoses  Name Primary?  . Cystitis Yes  . Urinary frequency      -rx cipro for UTI and gave reading information -pt to continue with [facility] for [facility] issues -pt to follow up as scheduled.  RTO sooner prn

## 2017-07-19 ENCOUNTER — Ambulatory Visit: Payer: Self-pay | Admitting: Physician Assistant

## 2017-07-19 ENCOUNTER — Encounter: Payer: Self-pay | Admitting: Physician Assistant

## 2017-07-19 VITALS — BP 150/90 | HR 91 | Temp 97.9°F | Ht 66.0 in | Wt 203.0 lb

## 2017-07-19 DIAGNOSIS — I10 Essential (primary) hypertension: Secondary | ICD-10-CM

## 2017-07-19 DIAGNOSIS — IMO0002 Reserved for concepts with insufficient information to code with codable children: Secondary | ICD-10-CM

## 2017-07-19 DIAGNOSIS — N3090 Cystitis, unspecified without hematuria: Secondary | ICD-10-CM

## 2017-07-19 DIAGNOSIS — E1165 Type 2 diabetes mellitus with hyperglycemia: Secondary | ICD-10-CM

## 2017-07-19 DIAGNOSIS — Z794 Long term (current) use of insulin: Principal | ICD-10-CM

## 2017-07-19 DIAGNOSIS — F17219 Nicotine dependence, cigarettes, with unspecified nicotine-induced disorders: Secondary | ICD-10-CM

## 2017-07-19 DIAGNOSIS — F39 Unspecified mood [affective] disorder: Secondary | ICD-10-CM

## 2017-07-19 DIAGNOSIS — E118 Type 2 diabetes mellitus with unspecified complications: Principal | ICD-10-CM

## 2017-07-19 DIAGNOSIS — R87612 Low grade squamous intraepithelial lesion on cytologic smear of cervix (LGSIL): Secondary | ICD-10-CM

## 2017-07-19 DIAGNOSIS — J449 Chronic obstructive pulmonary disease, unspecified: Secondary | ICD-10-CM

## 2017-07-19 DIAGNOSIS — E785 Hyperlipidemia, unspecified: Secondary | ICD-10-CM

## 2017-07-19 NOTE — Progress Notes (Signed)
BP (!) 150/90 (BP Location: Left Arm, Patient Position: Sitting, Cuff Size: Normal)   Pulse 91   Temp 97.9 F (36.6 C)   Ht 5\' 6"  (1.676 m)   Wt 203 lb (92.1 kg)   SpO2 99%   BMI 32.77 kg/m    Subjective:    Patient ID: Shari Collins, female    DOB: 02-12-1976, 41 y.o.   MRN: [id number]  HPI: Shari Collins is a 41 y.o. female presenting on 07/19/2017 for Diabetes and Follow-up ( potassium recheck)   HPI   Pt has appt on Wednesday for colposcopy.  She says she lost her [employer] discount application  Pt says she never got the [place] from [place]- pt has never brought in the appropriate papers for to get approved for medassist.  Pt is still working at [employer].    Relevant past medical, surgical, family and social history reviewed and updated as indicated. Interim medical history since our last visit reviewed. Allergies and medications reviewed and updated.   Current Outpatient Medications:  .  albuterol (PROVENTIL HFA;VENTOLIN HFA) 108 (90 BASE) MCG/ACT inhaler, Inhale 2 puffs into the lungs every 6 (six) hours as needed for wheezing or shortness of breath., Disp: , Rfl:  .  ciprofloxacin (CIPRO) 500 MG tablet, Take 1 tablet (500 mg total) 2 (two) times daily by mouth., Disp: 14 tablet, Rfl: 0 .  metFORMIN (GLUCOPHAGE) 1000 MG tablet, Take 1 tablet (1,000 mg total) by mouth 2 (two) times daily with a meal., Disp: 60 tablet, Rfl: 4 .  Multiple Vitamin (MULTIVITAMIN) tablet, Take 1 tablet by mouth daily., Disp: , Rfl:  .  Omega-3 Fatty Acids (FISH OIL) 1000 MG CAPS, Take 1 capsule (1,000 mg total) by mouth 2 (two) times daily., Disp: , Rfl: 0 .  sitaGLIPtin (JANUVIA) 100 MG tablet, Take 1 tablet (100 mg total) by mouth daily. (Patient not taking: Reported on 07/01/2017), Disp: 90 tablet, Rfl: 1   Review of Systems  Constitutional: Negative for appetite change, chills, diaphoresis, fatigue, fever and unexpected weight change.  HENT: Negative for congestion, dental  problem, drooling, ear pain, facial swelling, hearing loss, mouth sores, sneezing, sore throat, trouble swallowing and voice change.   Eyes: Negative for pain, discharge, redness, itching and visual disturbance.  Respiratory: Negative for cough, choking, shortness of breath and wheezing.   Cardiovascular: Negative for chest pain, palpitations and leg swelling.  Gastrointestinal: Negative for abdominal pain, blood in stool, constipation, diarrhea and vomiting.  Endocrine: Negative for cold intolerance, heat intolerance and polydipsia.  Genitourinary: Positive for dysuria. Negative for decreased urine volume and hematuria.  Musculoskeletal: Negative for arthralgias, back pain and gait problem.  Skin: Negative for rash.  Allergic/Immunologic: Negative for environmental allergies.  Neurological: Negative for seizures, syncope, light-headedness and headaches.  Hematological: Negative for adenopathy.  Psychiatric/Behavioral: Positive for dysphoric mood. Negative for agitation and suicidal ideas. The patient is nervous/anxious.     Per HPI unless specifically indicated above     Objective:    BP (!) 150/90 (BP Location: Left Arm, Patient Position: Sitting, Cuff Size: Normal)   Pulse 91   Temp 97.9 F (36.6 C)   Ht 5\' 6"  (1.676 m)   Wt 203 lb (92.1 kg)   SpO2 99%   BMI 32.77 kg/m   Wt Readings from Last 3 Encounters:  07/19/17 203 lb (92.1 kg)  07/12/17 201 lb 8 oz (91.4 kg)  07/01/17 199 lb 8 oz (90.5 kg)    Physical Exam  Constitutional:  She is oriented to person, place, and time. She appears well-developed and well-nourished.  HENT:  Head: Normocephalic and atraumatic.  Neck: Neck supple.  Cardiovascular: Normal rate and regular rhythm.  Pulmonary/Chest: Effort normal and breath sounds normal.  Abdominal: Soft. Bowel sounds are normal. She exhibits no mass. There is no hepatosplenomegaly. There is no tenderness.  Musculoskeletal: She exhibits no edema.  Lymphadenopathy:    She  has no cervical adenopathy.  Neurological: She is alert and oriented to person, place, and time.  Skin: Skin is warm and dry.  Psychiatric: She has a normal mood and affect. Her behavior is normal.  Vitals reviewed.       Assessment & Plan:    Encounter Diagnoses  Name Primary?  Marland Kitchen Uncontrolled type 2 diabetes mellitus with complication, with long-term current use of insulin ([place]) Yes  . Hyperlipidemia, unspecified hyperlipidemia type   . Cigarette nicotine dependence with nicotine-induced disorder   . Chronic obstructive pulmonary disease, unspecified COPD type ([place])   . 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 Mood disorder ([place])   . Cystitis   . Elevated blood pressure reading in office with diagnosis of hypertension     -pt to go to gyn later this week as scheduled -pt is given another cone discount application -Will Recheck labs. Discussed with pt that if a1c still high may put her on insulin until whatever time she can get the [place].    -pt to finish her cipro for the UTI -will monitor the blood pressure -pt to follow up in 3 months.  RTO sooner prn

## 2017-07-20 ENCOUNTER — Other Ambulatory Visit (HOSPITAL_COMMUNITY)
Admission: RE | Admit: 2017-07-20 | Discharge: 2017-07-20 | Disposition: A | Discharge status: Home / Self Care | Payer: Self-pay | Source: Ambulatory Visit | Attending: Physician Assistant | Admitting: Physician Assistant

## 2017-07-20 DIAGNOSIS — Z794 Long term (current) use of insulin: Secondary | ICD-10-CM | POA: Insufficient documentation

## 2017-07-20 DIAGNOSIS — E1165 Type 2 diabetes mellitus with hyperglycemia: Secondary | ICD-10-CM | POA: Insufficient documentation

## 2017-07-20 DIAGNOSIS — IMO0002 Reserved for concepts with insufficient information to code with codable children: Secondary | ICD-10-CM

## 2017-07-20 DIAGNOSIS — E118 Type 2 diabetes mellitus with unspecified complications: Secondary | ICD-10-CM | POA: Insufficient documentation

## 2017-07-20 DIAGNOSIS — E785 Hyperlipidemia, unspecified: Secondary | ICD-10-CM | POA: Insufficient documentation

## 2017-07-20 LAB — COMPREHENSIVE METABOLIC PANEL
ALT: 14 U/L (ref 14–54)
AST: 14 U/L — ABNORMAL LOW (ref 15–41)
Albumin: 4 g/dL (ref 3.5–5.0)
Alkaline Phosphatase: 61 U/L (ref 38–126)
Anion gap: 8 (ref 5–15)
BUN: 10 mg/dL (ref 6–20)
CHLORIDE: 102 mmol/L (ref 101–111)
CO2: 24 mmol/L (ref 22–32)
CREATININE: 0.61 mg/dL (ref 0.44–1.00)
Calcium: 8.8 mg/dL — ABNORMAL LOW (ref 8.9–10.3)
Glucose, Bld: 180 mg/dL — ABNORMAL HIGH (ref 65–99)
Potassium: 3.7 mmol/L (ref 3.5–5.1)
Sodium: 134 mmol/L — ABNORMAL LOW (ref 135–145)
TOTAL PROTEIN: 6.7 g/dL (ref 6.5–8.1)
Total Bilirubin: 0.8 mg/dL (ref 0.3–1.2)

## 2017-07-20 LAB — LIPID PANEL
CHOLESTEROL: 208 mg/dL — AB (ref 0–200)
HDL: 40 mg/dL — ABNORMAL LOW (ref >40)
LDL Cholesterol: 90 mg/dL (ref 0–99)
TRIGLYCERIDES: 388 mg/dL — AB (ref <150)
Total CHOL/HDL Ratio: 5.2 RATIO
VLDL: 78 mg/dL — AB (ref 0–40)

## 2017-07-20 LAB — HEMOGLOBIN A1C
Hgb A1c MFr Bld: 7.8 % — ABNORMAL HIGH (ref 4.8–5.6)
Mean Plasma Glucose: 177.16 mg/dL

## 2017-07-21 ENCOUNTER — Encounter: Payer: Self-pay | Admitting: Obstetrics and Gynecology

## 2017-07-21 ENCOUNTER — Other Ambulatory Visit: Payer: Self-pay | Admitting: Obstetrics and Gynecology

## 2017-07-21 ENCOUNTER — Ambulatory Visit (INDEPENDENT_AMBULATORY_CARE_PROVIDER_SITE_OTHER): Payer: PRIVATE HEALTH INSURANCE | Admitting: Obstetrics and Gynecology

## 2017-07-21 VITALS — BP 132/70 | HR 70 | Ht 66.0 in | Wt 200.5 lb

## 2017-07-21 DIAGNOSIS — N871 Moderate cervical dysplasia: Secondary | ICD-10-CM

## 2017-07-21 DIAGNOSIS — Z3202 Encounter for pregnancy test, result negative: Secondary | ICD-10-CM

## 2017-07-21 DIAGNOSIS — R87618 Other abnormal cytological findings on specimens from cervix uteri: Secondary | ICD-10-CM

## 2017-07-21 LAB — POCT URINE PREGNANCY: Preg Test, Ur: NEGATIVE

## 2017-07-21 NOTE — Progress Notes (Signed)
Patient ID: Shari Collins, female   DOB: 1976/07/05, 41 y.o.   MRN: [id number]  Shari Collins 41 y.o. [id number] here for colposcopy for low-grade squamous intraepithelial neoplasia (LGSIL - encompassing HPV,mild dysplasia,CIN I) pap smear on 11/2016. Discussed role for HPV in cervical dysplasia, need for surveillance. Patient's last menstrual period was 07/04/2017.   Patient given informed consent, signed copy in the chart, time out was performed.  Placed in lithotomy position. Cervix viewed with speculum and colposcope after application of acetic acid.   Colposcopy adequate? Yes White epithelial anterior lip cervix; biopsies obtained at 11, 12, & 2 o'clock.   ECC specimen obtained. All specimens were labelled and sent to pathology.   Colposcopy IMPRESSION: 1. 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 Patient was given post procedure instructions. 3. Will follow up pathology and manage accordingly. Routine preventative health maintenance measures emphasized. 4. Will contact by phone with results 5. F/U 6 weeks Cryo   By signing my name below, I, Margit Banda, attest that this documentation has been prepared under the direction and in the presence of Jonnie Kind, MD. Electronically Signed: Margit Banda, Medical Scribe. 07/21/17. 1:34 PM.  .jfss I personally performed the services described in this documentation, which was SCRIBED in my presence. The recorded information has been reviewed and considered accurate. It has been edited as necessary during review. Jonnie Kind, MD

## 2017-07-24 ENCOUNTER — Encounter (HOSPITAL_COMMUNITY): Payer: Self-pay | Admitting: Emergency Medicine

## 2017-07-24 ENCOUNTER — Emergency Department (HOSPITAL_COMMUNITY)
Admission: EM | Admit: 2017-07-24 | Discharge: 2017-07-24 | Disposition: A | Discharge status: Home / Self Care | Payer: Self-pay | Attending: Emergency Medicine | Admitting: Emergency Medicine

## 2017-07-24 DIAGNOSIS — J45909 Unspecified asthma, uncomplicated: Secondary | ICD-10-CM | POA: Insufficient documentation

## 2017-07-24 DIAGNOSIS — Z79899 Other long term (current) drug therapy: Secondary | ICD-10-CM | POA: Insufficient documentation

## 2017-07-24 DIAGNOSIS — Z91040 Latex allergy status: Secondary | ICD-10-CM | POA: Insufficient documentation

## 2017-07-24 DIAGNOSIS — I10 Essential (primary) hypertension: Secondary | ICD-10-CM | POA: Insufficient documentation

## 2017-07-24 DIAGNOSIS — R197 Diarrhea, unspecified: Secondary | ICD-10-CM | POA: Insufficient documentation

## 2017-07-24 DIAGNOSIS — E119 Type 2 diabetes mellitus without complications: Secondary | ICD-10-CM | POA: Insufficient documentation

## 2017-07-24 DIAGNOSIS — R42 Dizziness and giddiness: Secondary | ICD-10-CM | POA: Insufficient documentation

## 2017-07-24 DIAGNOSIS — Z7984 Long term (current) use of oral hypoglycemic drugs: Secondary | ICD-10-CM | POA: Insufficient documentation

## 2017-07-24 DIAGNOSIS — L299 Pruritus, unspecified: Secondary | ICD-10-CM | POA: Insufficient documentation

## 2017-07-24 DIAGNOSIS — T50905A Adverse effect of unspecified drugs, medicaments and biological substances, initial encounter: Secondary | ICD-10-CM | POA: Insufficient documentation

## 2017-07-24 DIAGNOSIS — F17210 Nicotine dependence, cigarettes, uncomplicated: Secondary | ICD-10-CM | POA: Insufficient documentation

## 2017-07-24 LAB — CBC WITH DIFFERENTIAL/PLATELET
BASOS PCT: 0 %
Basophils Absolute: 0 10*3/uL (ref 0.0–0.1)
EOS ABS: 0.4 10*3/uL (ref 0.0–0.7)
EOS PCT: 3 %
HEMATOCRIT: 40.3 % (ref 36.0–46.0)
Hemoglobin: 13.8 g/dL (ref 12.0–15.0)
Lymphocytes Relative: 24 %
Lymphs Abs: 2.7 10*3/uL (ref 0.7–4.0)
MCH: 31.2 pg (ref 26.0–34.0)
MCHC: 34.2 g/dL (ref 30.0–36.0)
MCV: 91.2 fL (ref 78.0–100.0)
MONOS PCT: 4 %
Monocytes Absolute: 0.4 10*3/uL (ref 0.1–1.0)
NEUTROS PCT: 69 %
Neutro Abs: 8 10*3/uL — ABNORMAL HIGH (ref 1.7–7.7)
PLATELETS: 285 10*3/uL (ref 150–400)
RBC: 4.42 MIL/uL (ref 3.87–5.11)
RDW: 15.2 % (ref 11.5–15.5)
WBC: 11.5 10*3/uL — ABNORMAL HIGH (ref 4.0–10.5)

## 2017-07-24 LAB — BASIC METABOLIC PANEL
Anion gap: 4 — ABNORMAL LOW (ref 5–15)
BUN: 7 mg/dL (ref 6–20)
CALCIUM: 8.9 mg/dL (ref 8.9–10.3)
CO2: 27 mmol/L (ref 22–32)
CREATININE: 0.54 mg/dL (ref 0.44–1.00)
Chloride: 105 mmol/L (ref 101–111)
GFR calc non Af Amer: >60 mL/min (ref >60)
Glucose, Bld: 196 mg/dL — ABNORMAL HIGH (ref 65–99)
Potassium: 4.1 mmol/L (ref 3.5–5.1)
SODIUM: 136 mmol/L (ref 135–145)

## 2017-07-24 LAB — URINALYSIS, ROUTINE W REFLEX MICROSCOPIC
BILIRUBIN URINE: NEGATIVE
GLUCOSE, UA: NEGATIVE mg/dL
Hgb urine dipstick: NEGATIVE
Ketones, ur: NEGATIVE mg/dL
Leukocytes, UA: NEGATIVE
Nitrite: NEGATIVE
PH: 6 (ref 5.0–8.0)
PROTEIN: NEGATIVE mg/dL
Specific Gravity, Urine: 1.008 (ref 1.005–1.030)

## 2017-07-24 LAB — CBG MONITORING, ED: Glucose-Capillary: 188 mg/dL — ABNORMAL HIGH (ref 65–99)

## 2017-07-24 MED ORDER — HYDROXYZINE HCL 25 MG PO TABS
25.0000 mg | ORAL_TABLET | Freq: Once | ORAL | Status: AC
  Administered 2017-07-24: 25 mg via ORAL
  Filled 2017-07-24: qty 1

## 2017-07-24 MED ORDER — HYDROXYZINE PAMOATE 25 MG PO CAPS: 25.0000 mg | ORAL_CAPSULE | Freq: Three times a day (TID) | ORAL | 0 refills | Status: DC | PRN

## 2017-07-24 NOTE — Discharge Instructions (Addendum)
Your blood sugar is 188 at this time.  Your blood pressure is 160/75.  There are no major neurologic problems on your examination.  No cardiovascular changes or deficits at this time.  Please stop the BuSpar for now.  Please contact your physician, or the designated physician covering for your specialist on Monday or Tuesday and discuss the changes and problems you are having with the BuSpar.  YOur blood  work is mostly clear. Please use Vistaril 3 times daily to help with your reaction symptoms.  Please increase fluids to manage the symptoms you are having.

## 2017-07-24 NOTE — ED Provider Notes (Signed)
[facility] EMERGENCY DEPARTMENT Provider Note   CSN: [id number] Arrival date & time: 07/24/17  1652     History   Chief Complaint Chief Complaint  Patient presents with  . Medication Reaction    HPI Shari Collins is a 41 y.o. female.  Patient is a 41 year old female who presents to the emergency department because she feels she is having a reaction to medication.  Patient states that around midnight last evening she got up to go to the bathroom.  She noticed that she was dizzy, and she describes the dizziness as feeling drunk, feeling off balance as though she was going to fall.  She states that later on during the day she has been having problems with itching, dry mouth, and at times shaking sensation.  She denies eating anything new and she states she has been eating her meals pretty much on a regular routine over the last 24 hours.  She has not had any change in her environment.  She has had a change in medication.  She takes metformin in the mornings.  At noon she takes Celexa and BuSpar, and at dinner she takes Celexa and BuSpar.  She is only been on the BuSpar for about 48 hours.  It is also of note that the patient has 4-5 episodes of diarrhea about every day or every other day.  She states however that she drinks about 5 bottles of water to help compensate for what she is losing in the diarrhea.  She denies any high fever or excessive body aches or rash.  She presents now because she states she is tired of being dizzy, she is tired of itching and tired of the other symptoms.      Past Medical History:  Diagnosis Date  . Asthma   . Depression   . Diabetes mellitus   . Hypertension   . Noncompliance     Patient Active Problem List   Diagnosis Date Noted  . Hyperlipidemia 10/07/2015  . Breast abscess 10/02/2015  . Schizophrenia (Otho) 10/02/2015  . Bipolar disorder, unspecified (Oso) 10/02/2015  . Uncontrolled type 2 diabetes mellitus with complication (Benedict)  66/29/4765  . Personal history of noncompliance with medical treatment, presenting hazards to health 09/24/2015    Past Surgical History:  Procedure Laterality Date  . CESAREAN SECTION    . TONSILLECTOMY      OB History    Gravida Para Term Preterm AB Living   4 1 1   3 1    SAB TAB Ectopic Multiple Live Births   3       1       Home Medications    Prior to Admission medications   Medication Sig Start Date End Date Taking? Authorizing Provider  albuterol (PROVENTIL HFA;VENTOLIN HFA) 108 (90 BASE) MCG/ACT inhaler Inhale 2 puffs into the lungs every 6 (six) hours as needed for wheezing or shortness of breath.    [provider]  hydrOXYzine (VISTARIL) 25 MG capsule Take 1 capsule (25 mg total) 3 (three) times daily as needed by mouth. 07/24/17   Lily Kocher, PA-C  ibuprofen (ADVIL,MOTRIN) 200 MG tablet Take 800 mg as needed by mouth.    [provider]  metFORMIN (GLUCOPHAGE) 1000 MG tablet Take 1 tablet (1,000 mg total) by mouth 2 (two) times daily with a meal. 05/31/17   Soyla Dryer, PA-C  Multiple Vitamin (MULTIVITAMIN) tablet Take 1 tablet by mouth daily.    [provider]  Omega-3 Fatty Acids ([place]  OIL) 1000 MG CAPS Take 1 capsule (1,000 mg total) by mouth 2 (two) times daily. 05/31/17   Soyla Dryer, PA-C    Family History Family History  Problem Relation Age of Onset  . Hypertension Paternal Grandmother   . Hypertension Maternal Grandmother   . Diabetes Father   . Hypertension Father   . Cancer Mother        liver  . Heart attack Paternal Grandfather   . Diabetes Brother     Social History Social History   Tobacco Use  . Smoking status: Current Every Day Smoker    Packs/day: 0.50    Years: 20.00    Pack years: 10.00    Types: Cigarettes  . Smokeless tobacco: Never Used  Substance Use Topics  . Alcohol use: Yes    Comment: everyday  . Drug use: No     Allergies   Bee venom; Latex; Reglan [metoclopramide]; and Sulfa  antibiotics   Review of Systems Review of Systems  Constitutional: Negative for activity change.       All ROS Neg except as noted in HPI  HENT: Negative for nosebleeds.   Eyes: Negative for photophobia and discharge.  Respiratory: Negative for cough, shortness of breath and wheezing.   Cardiovascular: Negative for chest pain and palpitations.  Gastrointestinal: Positive for diarrhea. Negative for abdominal pain and blood in stool.  Genitourinary: Negative for dysuria, frequency and hematuria.  Musculoskeletal: Negative for arthralgias, back pain and neck pain.  Skin: Negative.        Itching  Neurological: Positive for dizziness. Negative for seizures and speech difficulty.       Shaking  Psychiatric/Behavioral: Negative for confusion and hallucinations.     Physical Exam Updated Vital Signs BP (!) 160/75 (BP Location: Left Arm)   Pulse 70   Temp 99.4 F (37.4 C) (Oral)   Resp 16   Ht 5\' 6"  (1.676 m)   Wt 90.7 kg (200 lb)   LMP 07/04/2017   SpO2 97%   BMI 32.28 kg/m   Physical Exam  Constitutional: She appears well-developed and well-nourished. No distress.  HENT:  Head: Normocephalic and atraumatic.  Right Ear: External ear normal.  Left Ear: External ear normal.  Eyes: Conjunctivae are normal. Right eye exhibits no discharge. Left eye exhibits no discharge. No scleral icterus.  Neck: Neck supple. No tracheal deviation present.  Cardiovascular: Normal rate, regular rhythm and intact distal pulses.  Pulmonary/Chest: Effort normal and breath sounds normal. No stridor. No respiratory distress. She has no wheezes. She has no rales.  Abdominal: Soft. Bowel sounds are normal. She exhibits no distension. There is no tenderness. There is no rebound and no guarding.  Musculoskeletal: She exhibits no edema or tenderness.  Neurological: She is alert. She has normal strength. No cranial nerve deficit (no facial droop, extraocular movements intact, no slurred speech) or sensory  deficit. She exhibits normal muscle tone. She displays no seizure activity. Coordination normal.  Skin: Skin is warm and dry. No rash noted.  Psychiatric: She has a normal mood and affect.  Nursing note and vitals reviewed.    ED Treatments / Results  Labs (all labs ordered are listed, but only abnormal results are displayed) Labs Reviewed  BASIC METABOLIC PANEL - Abnormal; Notable for the following components:      Result Value   Glucose, Bld 196 (*)    Anion gap 4 (*)    All other components within normal limits  CBC WITH DIFFERENTIAL/PLATELET - Abnormal; Notable for  the following components:   WBC 11.5 (*)    Neutro Abs 8.0 (*)    All other components within normal limits  CBG MONITORING, ED - Abnormal; Notable for the following components:   Glucose-Capillary 188 (*)    All other components within normal limits  URINALYSIS, ROUTINE W REFLEX MICROSCOPIC    EKG  EKG Interpretation None       Radiology No results found.  Procedures Procedures (including critical care time)  Medications Ordered in ED Medications  hydrOXYzine (ATARAX/VISTARIL) tablet 25 mg (25 mg Oral Given 07/24/17 1936)     Initial Impression / Assessment and Plan / ED Course  I have reviewed the triage vital signs and the nursing notes.  Pertinent labs & imaging results that were available during my care of the patient were reviewed by me and considered in my medical decision making (see chart for details).      Final Clinical Impressions(s) / ED Diagnoses MDM Blood pressure is elevated.  Pulse oximetry is 97% on room air.  Within normal limits by my interpretation.  The temperature is 99.4.  The patient has recently started BuSpar.  Discussion with pharmacy states that the symptoms that she has been having is consistent with BuSpar.  I have asked the patient to hold the BuSpar for now and to discuss this with her primary physician on first thing on Monday.  Patient also has slight changes  in the white blood cell count with a mild temperature elevation and a slight elevation in neutrophils.  Suspect the patient is fighting upper respiratory infection.  I have asked the patient to monitor her temperature closely.  Patient also has episodes of diarrhea almost daily and I have asked the patient to monitor her fluid intake closely.  Patient is to follow-up with Ms. McElroy in the office concerning these multiple symptoms and to see the physician who prescribed her BuSpar as soon as possible.  Patient received Vistaril in the emergency department with some improvement.  Prescription for Vistaril also given.   Final diagnoses:  Medication reaction, initial encounter    ED Discharge Orders        Ordered    hydrOXYzine (VISTARIL) 25 MG capsule  3 times daily PRN     07/24/17 1928       Lily Kocher, PA-C 07/24/17 2121    Noemi Chapel, MD 07/26/17 1446

## 2017-07-24 NOTE — ED Triage Notes (Signed)
Pt reports starting Buspar yesterday and taking 2 doses.  States her pupils have been dilated, she has the shakes, and dry mouth.

## 2017-07-24 NOTE — ED Notes (Signed)
Pt has taken buspar x 2 days prescribed by [facility]  She reports after taking it today she became dizzy and felt that she would pass out   Pt also takes Metformin as well as Celexa  [facility], [place], 12 per cent of patient experience dizziness

## 2017-08-10 ENCOUNTER — Encounter: Payer: Self-pay | Admitting: Physician Assistant

## 2017-08-10 ENCOUNTER — Ambulatory Visit: Payer: Self-pay | Admitting: Physician Assistant

## 2017-08-10 VITALS — BP 124/74 | HR 71 | Temp 97.7°F | Ht 66.0 in | Wt 204.0 lb

## 2017-08-10 DIAGNOSIS — N61 Mastitis without abscess: Secondary | ICD-10-CM

## 2017-08-10 DIAGNOSIS — N6320 Unspecified lump in the left breast, unspecified quadrant: Secondary | ICD-10-CM

## 2017-08-10 MED ORDER — CEPHALEXIN 500 MG PO CAPS: 500.0000 mg | ORAL_CAPSULE | Freq: Four times a day (QID) | ORAL | 0 refills | Status: DC

## 2017-08-10 NOTE — Progress Notes (Signed)
BP 124/74 (BP Location: Left Arm, Patient Position: Sitting, Cuff Size: Large)   Pulse 71   Temp 97.7 F (36.5 C) (Other (Comment))   Ht 5\' 6"  (1.676 m)   Wt 204 lb (92.5 kg)   LMP 08/02/2017   SpO2 96%   BMI 32.93 kg/m    Subjective:    Patient ID: Shari Collins, female    DOB: April 15, 1976, 41 y.o.   MRN: [id number]  HPI: Shari Collins is a 41 y.o. female presenting on 08/10/2017 for Breast Pain (c/o pain to left breast > 1 week)   HPI Chief Complaint  Patient presents with  . Breast Pain    c/o pain to left breast > 1 week    Pt states the breast is swollen and has sharp pains.  There is no discharge.   It hurts to move.   Relevant past medical, surgical, family and social history reviewed and updated as indicated. Interim medical history since our last visit reviewed. Allergies and medications reviewed and updated.  Review of Systems  Constitutional: Negative for appetite change, chills, diaphoresis, fatigue, fever and unexpected weight change.  HENT: Negative for congestion, dental problem, drooling, ear pain, facial swelling, hearing loss, mouth sores, sneezing, sore throat, trouble swallowing and voice change.   Eyes: Negative for pain, discharge, redness, itching and visual disturbance.  Respiratory: Negative for cough, choking, shortness of breath and wheezing.   Cardiovascular: Negative for chest pain, palpitations and leg swelling.  Gastrointestinal: Negative for abdominal pain, blood in stool, constipation, diarrhea and vomiting.  Endocrine: Negative for cold intolerance, heat intolerance and polydipsia.  Genitourinary: Negative for decreased urine volume, dysuria and hematuria.  Musculoskeletal: Negative for arthralgias, back pain and gait problem.  Skin: Negative for rash.  Allergic/Immunologic: Negative for environmental allergies.  Neurological: Negative for seizures, syncope, light-headedness and headaches.  Hematological: Negative for adenopathy.    Psychiatric/Behavioral: Negative for agitation, dysphoric mood and suicidal ideas. The patient is not nervous/anxious.     Per HPI unless specifically indicated above     Objective:    BP 124/74 (BP Location: Left Arm, Patient Position: Sitting, Cuff Size: Large)   Pulse 71   Temp 97.7 F (36.5 C) (Other (Comment))   Ht 5\' 6"  (1.676 m)   Wt 204 lb (92.5 kg)   LMP 08/02/2017   SpO2 96%   BMI 32.93 kg/m   Wt Readings from Last 3 Encounters:  08/10/17 204 lb (92.5 kg)  07/24/17 200 lb (90.7 kg)  07/21/17 200 lb 8 oz (90.9 kg)    Physical Exam  Constitutional: She is oriented to person, place, and time. She appears well-developed and well-nourished.  HENT:  Head: Normocephalic and atraumatic.  Pulmonary/Chest: Effort normal. Left breast exhibits mass and tenderness.    Tender, red, firm.  No nipple discharge. No drainage.  Appears more cellulitis than abscess.   R breast exam benign.   Neurological: She is alert and oriented to person, place, and time.  Skin: Skin is warm and dry.  Psychiatric: She has a normal mood and affect. Her behavior is normal.  Nursing note and vitals reviewed.       Assessment & Plan:    Encounter Diagnoses  Name Primary?  . Cellulitis of left breast Yes  . Breast mass, left      -pt referred to [facility] for diagnostic mammogram at last OV but no appt yet scheduled.  Will re-refer.  Pt is told to call our office and speak with  nurse if she hasn't heard from [facility] by lunchtime on Thursday. -pt to use warm compresses on breast. -discussed diclox versus keflex with pt.  diclox preferable but pt would like keflex due to the cost.  rx given -pt to RTO in 2 wk to recheck this and make sure pt has been evaluated with diagnostic mammogram and that infection cleared.  Pt to RTO sooner prn worsening

## 2017-08-13 ENCOUNTER — Other Ambulatory Visit: Payer: Self-pay

## 2017-08-13 ENCOUNTER — Emergency Department (HOSPITAL_COMMUNITY)
Admission: EM | Admit: 2017-08-13 | Discharge: 2017-08-13 | Disposition: A | Discharge status: Home / Self Care | Payer: PRIVATE HEALTH INSURANCE | Attending: Emergency Medicine | Admitting: Emergency Medicine

## 2017-08-13 ENCOUNTER — Emergency Department (HOSPITAL_COMMUNITY): Payer: Self-pay

## 2017-08-13 ENCOUNTER — Encounter (HOSPITAL_COMMUNITY): Payer: Self-pay | Admitting: Emergency Medicine

## 2017-08-13 DIAGNOSIS — Z79899 Other long term (current) drug therapy: Secondary | ICD-10-CM | POA: Insufficient documentation

## 2017-08-13 DIAGNOSIS — F17210 Nicotine dependence, cigarettes, uncomplicated: Secondary | ICD-10-CM | POA: Insufficient documentation

## 2017-08-13 DIAGNOSIS — N611 Abscess of the breast and nipple: Secondary | ICD-10-CM

## 2017-08-13 DIAGNOSIS — Z91040 Latex allergy status: Secondary | ICD-10-CM | POA: Insufficient documentation

## 2017-08-13 DIAGNOSIS — J45909 Unspecified asthma, uncomplicated: Secondary | ICD-10-CM | POA: Insufficient documentation

## 2017-08-13 DIAGNOSIS — L0291 Cutaneous abscess, unspecified: Secondary | ICD-10-CM

## 2017-08-13 DIAGNOSIS — E119 Type 2 diabetes mellitus without complications: Secondary | ICD-10-CM | POA: Insufficient documentation

## 2017-08-13 DIAGNOSIS — Z7984 Long term (current) use of oral hypoglycemic drugs: Secondary | ICD-10-CM | POA: Insufficient documentation

## 2017-08-13 DIAGNOSIS — I10 Essential (primary) hypertension: Secondary | ICD-10-CM | POA: Insufficient documentation

## 2017-08-13 LAB — CBC WITH DIFFERENTIAL/PLATELET
BASOS ABS: 0.1 10*3/uL (ref 0.0–0.1)
Basophils Relative: 0 %
EOS ABS: 0.5 10*3/uL (ref 0.0–0.7)
EOS PCT: 4 %
HCT: 40.5 % (ref 36.0–46.0)
Hemoglobin: 13.8 g/dL (ref 12.0–15.0)
Lymphocytes Relative: 18 %
Lymphs Abs: 2.3 10*3/uL (ref 0.7–4.0)
MCH: 31 pg (ref 26.0–34.0)
MCHC: 34.1 g/dL (ref 30.0–36.0)
MCV: 91 fL (ref 78.0–100.0)
MONO ABS: 0.5 10*3/uL (ref 0.1–1.0)
Monocytes Relative: 4 %
Neutro Abs: 9 10*3/uL — ABNORMAL HIGH (ref 1.7–7.7)
Neutrophils Relative %: 74 %
PLATELETS: 353 10*3/uL (ref 150–400)
RBC: 4.45 MIL/uL (ref 3.87–5.11)
RDW: 14.9 % (ref 11.5–15.5)
WBC: 12.3 10*3/uL — AB (ref 4.0–10.5)

## 2017-08-13 LAB — BASIC METABOLIC PANEL
ANION GAP: 7 (ref 5–15)
BUN: 12 mg/dL (ref 6–20)
CALCIUM: 9.2 mg/dL (ref 8.9–10.3)
CO2: 25 mmol/L (ref 22–32)
Chloride: 102 mmol/L (ref 101–111)
Creatinine, Ser: 0.65 mg/dL (ref 0.44–1.00)
GFR calc Af Amer: >60 mL/min (ref >60)
GLUCOSE: 205 mg/dL — AB (ref 65–99)
POTASSIUM: 3.9 mmol/L (ref 3.5–5.1)
SODIUM: 134 mmol/L — AB (ref 135–145)

## 2017-08-13 MED ORDER — OXYCODONE-ACETAMINOPHEN 5-325 MG PO TABS
1.0000 | ORAL_TABLET | Freq: Once | ORAL | Status: AC
  Administered 2017-08-13: 1 via ORAL
  Filled 2017-08-13: qty 1

## 2017-08-13 MED ORDER — MORPHINE SULFATE (PF) 4 MG/ML IV SOLN
4.0000 mg | Freq: Once | INTRAVENOUS | Status: AC
  Administered 2017-08-13: 4 mg via INTRAVENOUS
  Filled 2017-08-13: qty 1

## 2017-08-13 MED ORDER — HYDROCODONE-ACETAMINOPHEN 5-325 MG PO TABS: 1.0000 | ORAL_TABLET | ORAL | 0 refills | Status: DC | PRN

## 2017-08-13 MED ORDER — POVIDONE-IODINE 10 % EX SOLN
CUTANEOUS | Status: AC
  Filled 2017-08-13: qty 45

## 2017-08-13 MED ORDER — ONDANSETRON HCL 4 MG/2ML IJ SOLN
4.0000 mg | Freq: Once | INTRAMUSCULAR | Status: AC
  Administered 2017-08-13: 4 mg via INTRAVENOUS
  Filled 2017-08-13: qty 2

## 2017-08-13 MED ORDER — MORPHINE SULFATE (PF) 4 MG/ML IV SOLN
INTRAVENOUS | Status: AC
  Filled 2017-08-13: qty 1

## 2017-08-13 MED ORDER — CLINDAMYCIN PHOSPHATE 600 MG/50ML IV SOLN
600.0000 mg | Freq: Once | INTRAVENOUS | Status: AC
  Administered 2017-08-13: 600 mg via INTRAVENOUS
  Filled 2017-08-13: qty 50

## 2017-08-13 MED ORDER — LIDOCAINE HCL (PF) 2 % IJ SOLN
INTRAMUSCULAR | Status: AC
  Filled 2017-08-13: qty 20

## 2017-08-13 MED ORDER — MORPHINE SULFATE (PF) 2 MG/ML IV SOLN
2.0000 mg | Freq: Once | INTRAVENOUS | Status: AC
Start: 2017-08-13 — End: 2017-08-13
  Administered 2017-08-13: 2 mg via INTRAVENOUS

## 2017-08-13 NOTE — Consult Note (Signed)
[facility] Consult  Reason for Consult: Left breast abscess  Referring Physician:  Evalee Jefferson PA   Chief Complaint    Breast Pain      Shari Collins is a 41 y.o. female.  HPI: Shari Collins is a 41 yo with a left breast abscess. She has had an abscess in the area since Monday and was given Keflex but this did not resolve the issue, and it has gotten bigger and more swollen in that time. She reports having fibrocystic breast, and having had mammograms in the past, and US of the breast for similar abscesses. She had a left breast abscess aspirated 09/2015.  She reports having other abscesses that she has had to get drained and packed. She has associated swelling and pain of the left breast, and denies any chills or reports some subjective fevers.    She is due for a mammogram and it is being scheduled by her PCP.  No family history of any breast cancer. Personal history of right nipple discharge.   Past Medical History:  Diagnosis Date  . Asthma   . Depression   . Diabetes mellitus   . Hypertension   . Noncompliance     Past Surgical History:  Procedure Laterality Date  . CESAREAN SECTION    . TONSILLECTOMY      Family History  Problem Relation Age of Onset  . Hypertension Paternal Grandmother   . Hypertension Maternal Grandmother   . Diabetes Father   . Hypertension Father   . Cancer Mother        liver  . Heart attack Paternal Grandfather   . Diabetes Brother     Social History   Tobacco Use  . Smoking status: Current Every Day Smoker    Packs/day: 0.50    Years: 20.00    Pack years: 10.00    Types: Cigarettes  . Smokeless tobacco: Never Used  Substance Use Topics  . Alcohol use: Yes    Comment: everyday  . Drug use: No    Medications: I have reviewed the patient's current medications. Allergies as of 08/13/2017      Reactions   Bee Venom Anaphylaxis, Swelling   Buspar [buspirone] Itching, Anxiety   Dizziness, burning sensation, pupils  dilated   Latex Itching   Reglan [metoclopramide]    Itching, hives   Sulfa Antibiotics Itching, Rash      Medication List    TAKE these medications   albuterol 108 (90 Base) MCG/ACT inhaler Commonly known as:  PROVENTIL HFA;VENTOLIN HFA Inhale 2 puffs into the lungs every 6 (six) hours as needed for wheezing or shortness of breath.   cephALEXin 500 MG capsule Commonly known as:  KEFLEX Take 1 capsule (500 mg total) by mouth 4 (four) times daily.   Fish Oil 1000 MG Caps Take 1 capsule (1,000 mg total) by mouth 2 (two) times daily.   HYDROcodone-acetaminophen 5-325 MG tablet Commonly known as:  NORCO Take 1 tablet by mouth every 4 (four) hours as needed for severe pain.   hydrOXYzine 25 MG capsule Commonly known as:  VISTARIL Take 1 capsule (25 mg total) 3 (three) times daily as needed by mouth.   ibuprofen 200 MG tablet Commonly known as:  ADVIL,MOTRIN Take 800 mg as needed by mouth.   metFORMIN 1000 MG tablet Commonly known as:  GLUCOPHAGE Take 1 tablet (1,000 mg total) by mouth 2 (two) times daily with a meal.   multivitamin tablet Take 1 tablet by mouth daily.  ROS:  A comprehensive review of systems was negative except for: breast pain and swelling; fevers  Blood pressure (!) 152/87, pulse 62, temperature 98.2 F (36.8 C), temperature source Oral, resp. rate 18, height _0  (1.676 m), weight 200 lb (90.7 kg), last menstrual period 08/02/2017, SpO2 97 %. Physical Exam  Constitutional: She is oriented to person, place, and time and well-developed, well-nourished, and in no distress.  HENT:  Head: Normocephalic.  Eyes: Pupils are equal, round, and reactive to light.  Cardiovascular: Normal rate and regular rhythm.  Pulmonary/Chest: Effort normal and breath sounds normal.  Left breast tender, and swollen, fluctuance of nipple and lateral aspect of areola, no adenopathy, right breast normal without masses or adenopathy   Abdominal: Soft. She exhibits no  distension. There is no tenderness.  Musculoskeletal: Normal range of motion.  Neurological: She is alert and oriented to person, place, and time.  Skin: Skin is warm and dry.  Psychiatric: Mood, memory, affect and judgment normal.  Vitals reviewed.   Results: Results for orders placed or performed during the hospital encounter of 08/13/17 (from the past 48 hour(s))  CBC with Differential     Status: Abnormal   Collection Time: 08/13/17  1:40 PM  Result Value Ref Range   WBC 12.3 (H) 4.0 - 10.5 K/uL   RBC 4.45 3.87 - 5.11 MIL/uL   Hemoglobin 13.8 12.0 - 15.0 g/dL   HCT 40.5 36.0 - 46.0 %   MCV 91.0 78.0 - 100.0 fL   MCH 31.0 26.0 - 34.0 pg   MCHC 34.1 30.0 - 36.0 g/dL   RDW 14.9 11.5 - 15.5 %   Platelets 353 150 - 400 K/uL   Neutrophils Relative % 74 %   Neutro Abs 9.0 (H) 1.7 - 7.7 K/uL   Lymphocytes Relative 18 %   Lymphs Abs 2.3 0.7 - 4.0 K/uL   Monocytes Relative 4 %   Monocytes Absolute 0.5 0.1 - 1.0 K/uL   Eosinophils Relative 4 %   Eosinophils Absolute 0.5 0.0 - 0.7 K/uL   Basophils Relative 0 %   Basophils Absolute 0.1 0.0 - 0.1 K/uL  Basic metabolic panel     Status: Abnormal   Collection Time: 08/13/17  1:40 PM  Result Value Ref Range   Sodium 134 (L) 135 - 145 mmol/L   Potassium 3.9 3.5 - 5.1 mmol/L   Chloride 102 101 - 111 mmol/L   CO2 25 22 - 32 mmol/L   Glucose, Bld 205 (H) 65 - 99 mg/dL   BUN 12 6 - 20 mg/dL   Creatinine, Ser 0.65 0.44 - 1.00 mg/dL   Calcium 9.2 8.9 - 10.3 mg/dL   GFR calc non Af Amer >60 >60 mL/min   GFR calc Af Amer >60 >60 mL/min    Comment: (NOTE) The eGFR has been calculated using the CKD EPI equation. This calculation has not been validated in all clinical situations. eGFR's persistently <60 mL/min signify possible Chronic Kidney Disease.    Anion gap 7 5 - 15   Reviewed imaging personally- large retroaerola abscess largest component on the lateral aspect of nipple   US Breast Ltd Uni Left Inc Axilla  Result Date:  08/13/2017 CLINICAL DATA:  41 year old patient presents to the emergency room at [facility]. Ultrasound was ordered of the left breast for probable left breast abscess. The patient was given a prescription of antibiotics on Monday of this week. I was not present at [facility] to see the patient in person.  EXAM: ULTRASOUND OF THE LEFT BREAST COMPARISON:  Previous exam(s). FINDINGS: Targeted ultrasound is performed by the sonographer at [facility], showing a complex fluid collection in the retroareolar/periareolar left breast measuring 5.1 x 4.6 x 2.8 cm. Labeling of the images demonstrates that the fluid collection extends from the 12 o'clock left breast 2 cm from the nipple through the 3 o'clock position, and terminates in the 6 o'clock region. The superficial aspect of the complex fluid collection is immediately beneath the skin. The overlying skin is thickened, measuring up to 6 mm. On Doppler imaging, there is some vascular flow along the periphery of the complex fluid collection. IMPRESSION: 5.1 x 4.6 x 2.8 cm complex fluid collection in the retroareolar/periareolar left breast. In the appropriate clinical setting, this is consistent with a breast abscess. RECOMMENDATION: Surgical consultation is suggested. As soon is the patient is able, a bilateral diagnostic mammogram and left breast ultrasound is recommended in the mammography Department. I have discussed the findings and recommendations with the patient. Results were also provided in writing at the conclusion of the visit. If applicable, a reminder letter will be sent to the patient regarding the next appointment. BI-RADS CATEGORY  3: Probably benign. Electronically Signed   By: Curlene Dolphin M.D.   On: 08/13/2017 16:15     Assessment & Plan:  Shari Collins is a 41 y.o. female with a left breast abscess s/p incision and drainage of the abscess at the bedside. The patient tolerated the procedure well, and had some relief of the pain with the  drainage.   -Continue Keflex -Will unpack tomorrow in the Ed at 9am, and will have patient start packing on Sunday  -Will have her follow up with me on Thursday 08/19/17 at my office  -Norco 5/340m every 4 hrs PRN (15 tablets written)  -Will need a mammogram once healed   All questions were answered to the satisfaction of the patient.   LVirl Cagey12/03/2017, 9:17 PM

## 2017-08-13 NOTE — Discharge Instructions (Signed)
Come back to the ED tomorrow at [facility] to have the wound unpacked and repacked.  You will need to pack your wound daily. Continue your Keflex.   Incision and Drainage, Care After Refer to this sheet in the next few weeks. These instructions provide you with information about caring for yourself after your procedure. Your health care provider may also give you more specific instructions. Your treatment has been planned according to current medical practices, but problems sometimes occur. Call your health care provider if you have any problems or questions after your procedure. What can I expect after the procedure? After the procedure, it is common to have:  Pain or discomfort around your incision site.  Drainage from your incision.  Follow these instructions at home:  Take over-the-counter and prescription medicines only as told by your health care provider.  If you were prescribed an antibiotic medicine, take it as told by your health care provider.Do not stop taking the antibiotic even if you start to feel better.  Followinstructions from your health care provider about: ? How to take care of your incision. ? When and how you should change your packing and bandage (dressing). Wash your hands with soap and water before you change your dressing. If soap and water are not available, use hand sanitizer. ? When you should remove your dressing.  Do not take baths, swim, or use a hot tub until your health care provider approves.  Keep all follow-up visits as told by your health care provider. This is important.  Check your incision area every day for signs of infection. Check for: ? More redness, swelling, or pain. ? More fluid or blood. ? Warmth. ? Pus or a bad smell. Contact a health care provider if:  Your cyst or abscess returns.  You have a fever.  You have more redness, swelling, or pain around your incision.  You have more fluid or blood coming from your incision.  Your  incision feels warm to the touch.  You have pus or a bad smell coming from your incision. Get help right away if:  You have severe pain or bleeding.  You cannot eat or drink without vomiting.  You have decreased urine output.  You become short of breath.  You have chest pain.  You cough up blood.  The area where the incision and drainage occurred becomes numb or it tingles. This information is not intended to replace advice given to you by your health care provider. Make sure you discuss any questions you have with your health care provider. Document Released: 11/16/2011 Document Revised: 01/24/2016 Document Reviewed: 06/14/2015 Elsevier Interactive Patient Education  2017 [place].   Incision and Drainage Incision and drainage is a surgical procedure to open and drain a fluid-filled sac. The sac may be filled with pus, mucus, or blood. Examples of fluid-filled sacs that may need surgical drainage include cysts, skin infections (abscesses), and red lumps that develop from a ruptured cyst or a small abscess (boils). You may need this procedure if the affected area is large, painful, infected, or not healing well. Tell a health care provider about:  Any allergies you have.  All medicines you are taking, including vitamins, herbs, eye drops, creams, and over-the-counter medicines.  Any problems you or family members have had with anesthetic medicines.  Any blood disorders you have.  Any surgeries you have had.  Any medical conditions you have.  Whether you are pregnant or may be pregnant. What are the risks? Generally,  this is a safe procedure. However, problems may occur, including:  Infection.  Bleeding.  Allergic reactions to medicines.  Scarring.  What happens before the procedure?  You may need an ultrasound or other imaging tests to see how large or deep the fluid-filled sac is.  You may have blood tests to check for infection.  You may get a tetanus  shot.  You may be given antibiotic medicine to help prevent infection.  Follow instructions from your health care provider about eating or drinking restrictions.  Ask your health care provider about: ? Changing or stopping your regular medicines. This is especially important if you are taking diabetes medicines or blood thinners. ? Taking medicines such as aspirin and ibuprofen. These medicines can thin your blood. Do not take these medicines before your procedure if your health care provider instructs you not to.  Plan to have someone take you home after the procedure.  If you will be going home right after the procedure, plan to have someone stay with you for 24 hours. What happens during the procedure?  To reduce your risk of infection: ? Your health care team will wash or sanitize their hands. ? Your skin will be washed with soap.  You will be given one or more of the following: ? A medicine to help you relax (sedative). ? A medicine to numb the area (local anesthetic). ? A medicine to make you fall asleep (general anesthetic).  An incision will be made in the top of the fluid-filled sac.  The contents of the sac may be squeezed out, or a syringe or tube (catheter)may be used to empty the sac.  The catheter may be left in place for several weeks to drain any fluid. Or, your health care provider may stitch open the edges of the incision to make a long-term opening for drainage (marsupialization).  The inside of the sac may be washed out (irrigated) with a sterile solution and packed with gauze before it is covered with a bandage (dressing). The procedure may vary among health care providers and hospitals. What happens after the procedure?  Your blood pressure, heart rate, breathing rate, and blood oxygen level will be monitored often until the medicines you were given have worn off.  Do not drive for 24 hours if you received a sedative. This information is not intended to  replace advice given to you by your health care provider. Make sure you discuss any questions you have with your health care provider. Document Released: 02/17/2001 Document Revised: 01/30/2016 Document Reviewed: 06/14/2015 Elsevier Interactive Patient Education  2017 [employer]   Skin Abscess A skin abscess is an infected area on or under your skin that contains pus and other material. An abscess can happen almost anywhere on your body. Some abscesses break open (rupture) on their own. Most continue to get worse unless they are treated. The infection can spread deeper into the body and into your blood, which can make you feel sick. Treatment usually involves draining the abscess. Follow these instructions at home: Abscess Care  If you have an abscess that has not drained, place a warm, clean, wet washcloth over the abscess several times a day. Do this as told by your doctor.  Follow instructions from your doctor about how to take care of your abscess. Make sure you: ? Cover the abscess with a bandage (dressing). ? Change your bandage or gauze as told by your doctor. ? Wash your hands with soap and water before you  change the bandage or gauze. If you cannot use soap and water, use hand sanitizer.  Check your abscess every day for signs that the infection is getting worse. Check for: ? More redness, swelling, or pain. ? More fluid or blood. ? Warmth. ? More pus or a bad smell. Medicines   Take over-the-counter and prescription medicines only as told by your doctor.  If you were prescribed an antibiotic medicine, take it as told by your doctor. Do not stop taking the antibiotic even if you start to feel better. General instructions  To avoid spreading the infection: ? Do not share personal care items, towels, or hot tubs with others. ? Avoid making skin-to-skin contact with other people.  Keep all follow-up visits as told by your doctor. This is important. Contact a doctor  if:  You have more redness, swelling, or pain around your abscess.  You have more fluid or blood coming from your abscess.  Your abscess feels warm when you touch it.  You have more pus or a bad smell coming from your abscess.  You have a fever.  Your muscles ache.  You have chills.  You feel sick. Get help right away if:  You have very bad (severe) pain.  You see red streaks on your skin spreading away from the abscess. This information is not intended to replace advice given to you by your health care provider. Make sure you discuss any questions you have with your health care provider. Document Released: 02/10/2008 Document Revised: 04/19/2016 Document Reviewed: 07/03/2015 Elsevier Interactive Patient Education  [employer].

## 2017-08-13 NOTE — Procedures (Signed)
[facility] Procedure Note   08/13/17  Preoperative Diagnosis: Left breast abscess    Postoperative Diagnosis: Same   Procedure(s) Performed:  Incision and drainage at bedside    Surgeon: Lanell Matar. Constance Haw, MD   Assistants: No qualified resident was available   Anesthesia: 2% lidocaine    Specimens: Cultures sent    Estimated Blood Loss: Minimal   Complications: None   Wound Class: Infected    Findings: Large subareolar abscess of the left breast on US imaging. Patient with infection since Monday on antibiotics.    Procedure: The patient consented to the incision and drainage of the left breast abscess verbally.  The left breast was cleaned with chloraprep and 2% lidocaine was used to anesthetize the area.  Additional morphine 2mg  was given for pain control.  An incision was made on the lateral areola at the 2 to 4 o'clock position, and 50 cc of purulence was evacuated. The purulence was cultured.  The cavity was irrigated and packed with iodoform plain packing.  The area was covered with dry gauze and paper tape.  The patient tolerated the procedure well.    Curlene Labrum, MD [facility] [address] [place], [place] [address] [phone] (office)

## 2017-08-13 NOTE — ED Provider Notes (Signed)
[facility] EMERGENCY DEPARTMENT Provider Note   CSN: [id number] Arrival date & time: 08/13/17  1232     History   Chief Complaint Chief Complaint  Patient presents with  . Breast Pain    HPI Shari Collins is a 41 y.o. female with a history of DM, htn and asthma presenting with a worsened left breast infection.  She was seen by her PCP 4 days ago and diagnosed with a left breast cellulitis which involved her aereola and superior breast.  She was placed on Keflex, currently on day 4 and she has been using warm compresses but reports having increased pain in more localizing swelling right at her nipple, although she endorses the surrounding erythema has improved.  She has exquisite pain, is unable to get comfortable, wearing a bra is painful.  She denies chills but reports intermittent subjective fever, she does have nausea which she suspects is secondary to pain.  She has found no alleviators for her symptoms.  She is currently awaiting  being scheduled for a mammogram by her PCP.     The history is provided by the patient.    Past Medical History:  Diagnosis Date  . Asthma   . Depression   . Diabetes mellitus   . Hypertension   . Noncompliance     Patient Active Problem List   Diagnosis Date Noted  . Hyperlipidemia 10/07/2015  . Breast abscess 10/02/2015  . Schizophrenia ([place]) 10/02/2015  . Bipolar disorder, unspecified ([place]) 10/02/2015  . Uncontrolled type 2 diabetes mellitus with complication ([place]) 42/70/6237  . Personal history of noncompliance with medical treatment, presenting hazards to health 09/24/2015    Past Surgical History:  Procedure Laterality Date  . CESAREAN SECTION    . TONSILLECTOMY      OB History    Gravida Para Term Preterm AB Living   4 1 1   3 1    SAB TAB Ectopic Multiple Live Births   3       1       Home Medications    Prior to Admission medications   Medication Sig Start Date End Date Taking? Authorizing Provider  albuterol  (PROVENTIL HFA;VENTOLIN HFA) 108 (90 BASE) MCG/ACT inhaler Inhale 2 puffs into the lungs every 6 (six) hours as needed for wheezing or shortness of breath.    [provider]  cephALEXin (KEFLEX) 500 MG capsule Take 1 capsule (500 mg total) by mouth 4 (four) times daily. 08/10/17   Soyla Dryer, PA-C  hydrOXYzine (VISTARIL) 25 MG capsule Take 1 capsule (25 mg total) 3 (three) times daily as needed by mouth. Patient not taking: Reported on 08/10/2017 07/24/17   Lily Kocher, PA-C  ibuprofen (ADVIL,MOTRIN) 200 MG tablet Take 800 mg as needed by mouth.    [provider]  metFORMIN (GLUCOPHAGE) 1000 MG tablet Take 1 tablet (1,000 mg total) by mouth 2 (two) times daily with a meal. 05/31/17   Soyla Dryer, PA-C  Multiple Vitamin (MULTIVITAMIN) tablet Take 1 tablet by mouth daily.    [provider]  Omega-3 Fatty Acids (FISH OIL) 1000 MG CAPS Take 1 capsule (1,000 mg total) by mouth 2 (two) times daily. 05/31/17   Soyla Dryer, PA-C    Family History Family History  Problem Relation Age of Onset  . Hypertension Paternal Grandmother   . Hypertension Maternal Grandmother   . Diabetes Father   . Hypertension Father   . Cancer Mother        liver  .  Heart attack Paternal Grandfather   . Diabetes Brother     Social History Social History   Tobacco Use  . Smoking status: Current Every Day Smoker    Packs/day: 0.50    Years: 20.00    Pack years: 10.00    Types: Cigarettes  . Smokeless tobacco: Never Used  Substance Use Topics  . Alcohol use: Yes    Comment: everyday  . Drug use: No     Allergies   Bee venom; Buspar [buspirone]; Latex; Reglan [metoclopramide]; and Sulfa antibiotics   Review of Systems Review of Systems  Constitutional: Positive for fever.  HENT: Negative for congestion and sore throat.   Eyes: Negative.   Respiratory: Negative for chest tightness and shortness of breath.   Cardiovascular: Negative for chest pain.    Gastrointestinal: Positive for nausea. Negative for abdominal pain.  Genitourinary: Negative.   Musculoskeletal: Negative for arthralgias, joint swelling and neck pain.  Skin: Positive for color change. Negative for rash and wound.  Neurological: Negative for dizziness, weakness, light-headedness, numbness and headaches.  Psychiatric/Behavioral: Negative.      Physical Exam Updated Vital Signs BP (!) 145/75 (BP Location: Left Arm)   Pulse 62   Temp 98 F (36.7 C) (Oral)   Resp 16   Ht 5\' 6"  (1.676 m)   Wt 90.7 kg (200 lb)   LMP 08/02/2017   SpO2 98%   BMI 32.28 kg/m   Physical Exam  Constitutional: She appears well-developed and well-nourished.  HENT:  Head: Normocephalic and atraumatic.  Eyes: Conjunctivae are normal.  Neck: Normal range of motion.  Cardiovascular: Normal rate, regular rhythm, normal heart sounds and intact distal pulses.  Pulmonary/Chest: Effort normal and breath sounds normal. She has no wheezes.  ttp left breast with erythema and induration left breast.  There is an area of fluctuance at the nipple and areola from the 6 to the 8 oclock position.  No drainage, no tenting.  Abdominal: Soft. Bowel sounds are normal. There is no tenderness.  Musculoskeletal: Normal range of motion.  Neurological: She is alert.  Skin: Skin is warm and dry.  Psychiatric: She has a normal mood and affect.  Nursing note and vitals reviewed.    ED Treatments / Results  Labs (all labs ordered are listed, but only abnormal results are displayed) Labs Reviewed  CBC WITH DIFFERENTIAL/PLATELET - Abnormal; Notable for the following components:      Result Value   WBC 12.3 (*)    Neutro Abs 9.0 (*)    All other components within normal limits  BASIC METABOLIC PANEL - Abnormal; Notable for the following components:   Sodium 134 (*)    Glucose, Bld 205 (*)    All other components within normal limits    EKG  EKG Interpretation None       Radiology US Breast Ltd Uni  Left Inc Axilla  Result Date: 08/13/2017 CLINICAL DATA:  41 year old patient presents to the emergency room at [facility]. Ultrasound was ordered of the left breast for probable left breast abscess. The patient was given a prescription of antibiotics on Monday of this week. I was not present at [facility] to see the patient in person. EXAM: ULTRASOUND OF THE LEFT BREAST COMPARISON:  Previous exam(s). FINDINGS: Targeted ultrasound is performed by the sonographer at [facility], showing a complex fluid collection in the retroareolar/periareolar left breast measuring 5.1 x 4.6 x 2.8 cm. Labeling of the images demonstrates that the fluid collection extends from the 12  o'clock left breast 2 cm from the nipple through the 3 o'clock position, and terminates in the 6 o'clock region. The superficial aspect of the complex fluid collection is immediately beneath the skin. The overlying skin is thickened, measuring up to 6 mm. On Doppler imaging, there is some vascular flow along the periphery of the complex fluid collection. IMPRESSION: 5.1 x 4.6 x 2.8 cm complex fluid collection in the retroareolar/periareolar left breast. In the appropriate clinical setting, this is consistent with a breast abscess. RECOMMENDATION: Surgical consultation is suggested. As soon is the patient is able, a bilateral diagnostic mammogram and left breast ultrasound is recommended in the mammography Department. I have discussed the findings and recommendations with the patient. Results were also provided in writing at the conclusion of the visit. If applicable, a reminder letter will be sent to the patient regarding the next appointment. BI-RADS CATEGORY  3: Probably benign. Electronically Signed   By: Curlene Dolphin M.D.   On: 08/13/2017 16:15    Procedures Procedures (including critical care time)  Medications Ordered in ED Medications  clindamycin (CLEOCIN) IVPB 600 mg (not administered)  oxyCODONE-acetaminophen  (PERCOCET/ROXICET) 5-325 MG per tablet 1 tablet (1 tablet Oral Given 08/13/17 1443)  morphine 4 MG/ML injection 4 mg (4 mg Intravenous Given 08/13/17 1648)  ondansetron (ZOFRAN) injection 4 mg (4 mg Intravenous Given 08/13/17 1648)     Initial Impression / Assessment and Plan / ED Course  I have reviewed the triage vital signs and the nursing notes.  Pertinent labs & imaging results that were available during my care of the patient were reviewed by me and considered in my medical decision making (see chart for details).     Discussed findings with Dr. Constance Haw who will evaluate pt in ed, she is currently in surgery.  Clindamycin IV started.  Final Clinical Impressions(s) / ED Diagnoses   Final diagnoses:  Abscess  Breast abscess    ED Discharge Orders    None       Landis Martins 08/13/17 [place], [place], DO 08/14/17 1440

## 2017-08-13 NOTE — ED Triage Notes (Addendum)
Pt reports seen by PCP on Monday for same and was given abx for "breast infection". pt reports no improvement in pain of left breast. Pt reports intermittent fever,chills.

## 2017-08-14 ENCOUNTER — Emergency Department (HOSPITAL_COMMUNITY)
Admission: EM | Admit: 2017-08-14 | Discharge: 2017-08-14 | Disposition: A | Discharge status: Home / Self Care | Payer: Self-pay | Attending: Emergency Medicine | Admitting: Emergency Medicine

## 2017-08-14 ENCOUNTER — Encounter (HOSPITAL_COMMUNITY): Payer: Self-pay | Admitting: Emergency Medicine

## 2017-08-14 DIAGNOSIS — N611 Abscess of the breast and nipple: Secondary | ICD-10-CM | POA: Insufficient documentation

## 2017-08-14 DIAGNOSIS — Z5189 Encounter for other specified aftercare: Secondary | ICD-10-CM | POA: Insufficient documentation

## 2017-08-14 MED ORDER — TETANUS-DIPHTH-ACELL PERTUSSIS 5-2.5-18.5 LF-MCG/0.5 IM SUSP
0.5000 mL | Freq: Once | INTRAMUSCULAR | Status: AC
  Administered 2017-08-14: 0.5 mL via INTRAMUSCULAR
  Filled 2017-08-14: qty 0.5

## 2017-08-14 NOTE — Progress Notes (Signed)
[facility]  Packing changed. Did well. Repacked. Draining.  Plan to pack daily with 1/4 iodoform packing. Keflex Shower daily per regular routine and then pack.  Follow up 08/19/2017 @ 1:45PM.   Curlene Labrum, MD [facility] [address] [place], [place] [address] [phone] (office)

## 2017-08-14 NOTE — ED Triage Notes (Signed)
Wound recheck to left breast.  Procedure done by Dr Constance Haw yesterday at bedside.  Pt here for wound check.  Denies any pain at this time, only tender to touch.

## 2017-08-14 NOTE — ED Notes (Signed)
I-Stat at [phone] ot resulting in computer per E-link, results at nurses stations shows 7.76 mmol/L

## 2017-08-14 NOTE — Discharge Instructions (Signed)
Complete your antibiotic and follow Dr. Constance Haw instructions for home care of this abscess site.

## 2017-08-14 NOTE — ED Provider Notes (Signed)
Patient presents today for a abscess recheck and repacking of her left breast.  She was seen here yesterday by me initially for this problem and surgeon Dr. Constance Haw I indeed the site and asked patient to present today for wound recheck and repacking of the site.  Patient reports soreness at the site but improvement in pain, minimal drainage from the site.  Exam and repacking procedure was performed by Dr. Constance Haw.  Patient will follow up in Dr. Constance Haw office in 5 days, complete her Keflex prescription, daily packing changes after shower.  Patient also expressed concern over being out of date with her tetanus, breast infection was not secondary to trauma, however she is out of date and this was updated today.     MSE was initiated and I personally evaluated the patient and placed orders (if any) at  9:41 AM on August 14, 2017.  The patient appears stable so that the remainder of the MSE may be completed by another provider.   Evalee Jefferson, PA-C 08/14/17 [place], [place], DO 08/14/17 1443

## 2017-08-18 LAB — AEROBIC CULTURE  (SUPERFICIAL SPECIMEN): CULTURE: NORMAL

## 2017-08-18 LAB — AEROBIC CULTURE W GRAM STAIN (SUPERFICIAL SPECIMEN)

## 2017-08-19 ENCOUNTER — Encounter: Payer: Self-pay | Admitting: General Surgery

## 2017-08-19 ENCOUNTER — Ambulatory Visit (INDEPENDENT_AMBULATORY_CARE_PROVIDER_SITE_OTHER): Payer: PRIVATE HEALTH INSURANCE | Admitting: General Surgery

## 2017-08-19 ENCOUNTER — Telehealth: Payer: Self-pay | Admitting: *Deleted

## 2017-08-19 VITALS — BP 146/87 | HR 86 | Temp 97.7°F | Resp 18 | Ht 66.0 in | Wt 199.0 lb

## 2017-08-19 DIAGNOSIS — N611 Abscess of the breast and nipple: Secondary | ICD-10-CM

## 2017-08-19 NOTE — Telephone Encounter (Signed)
Post ED Visit - Positive Culture Follow-up  Culture report reviewed by antimicrobial stewardship pharmacist:  []  Elenor Quinones, Pharm.D. []  Heide Guile, Pharm.D., BCPS AQ-ID []  Parks Neptune, Pharm.D., BCPS []  Alycia Rossetti, Pharm.D., BCPS []  [place], Pharm.D., BCPS, AAHIVP []  Legrand Como, Pharm.D., BCPS, AAHIVP []  Salome Arnt, PharmD, BCPS []  Dimitri Ped, PharmD, BCPS [x]  Vincenza Hews, PharmD, BCPS  Positive wound culture No further patient follow-up is required at this time.  Harlon Flor [facility] 08/19/2017, 11:07 AM

## 2017-08-19 NOTE — Progress Notes (Signed)
[facility] History and Physical   Chief Complaint    Wound Check      Shari Collins is a 41 y.o. female.  HPI: Shari Collins is a 41 yo who underwent an I&D of a left breast abscess on Friday and was sent home with packing and a wound recheck Saturday. She comes in today without any complaints. The wound is healing up and no packing can be placed into the area. She is on her antibiotics, and denies any fevers or chills. She still has soreness of her breast. Her mammogram has been scheduled for some time in February by her PCP.  Past Medical History:  Diagnosis Date  . Asthma   . Depression   . Diabetes mellitus   . Hypertension   . Noncompliance     Past Surgical History:  Procedure Laterality Date  . CESAREAN SECTION    . TONSILLECTOMY      Family History  Problem Relation Age of Onset  . Hypertension Paternal Grandmother   . Hypertension Maternal Grandmother   . Diabetes Father   . Hypertension Father   . Cancer Mother        liver  . Heart attack Paternal Grandfather   . Diabetes Brother     Social History   Tobacco Use  . Smoking status: Current Every Day Smoker    Packs/day: 0.50    Years: 20.00    Pack years: 10.00    Types: Cigarettes  . Smokeless tobacco: Never Used  Substance Use Topics  . Alcohol use: Yes    Comment: everyday  . Drug use: No    Medications: I have reviewed the patient's current medications. Allergies as of 08/19/2017      Reactions   Bee Venom Anaphylaxis, Swelling   Buspar [buspirone] Itching, Anxiety   Dizziness, burning sensation, pupils dilated   Latex Itching   Reglan [metoclopramide]    Itching, hives   Sulfa Antibiotics Itching, Rash      Medication List        Accurate as of 08/19/17  5:28 PM. Always use your most recent med list.          albuterol 108 (90 Base) MCG/ACT inhaler Commonly known as:  PROVENTIL HFA;VENTOLIN HFA Inhale 2 puffs into the lungs every 6 (six) hours as needed for  wheezing or shortness of breath.   cephALEXin 500 MG capsule Commonly known as:  KEFLEX Take 1 capsule (500 mg total) by mouth 4 (four) times daily.   Fish Oil 1000 MG Caps Take 1 capsule (1,000 mg total) by mouth 2 (two) times daily.   HYDROcodone-acetaminophen 5-325 MG tablet Commonly known as:  NORCO Take 1 tablet by mouth every 4 (four) hours as needed for severe pain.   hydrOXYzine 25 MG capsule Commonly known as:  VISTARIL Take 1 capsule (25 mg total) 3 (three) times daily as needed by mouth.   ibuprofen 200 MG tablet Commonly known as:  ADVIL,MOTRIN Take 800 mg as needed by mouth.   metFORMIN 1000 MG tablet Commonly known as:  GLUCOPHAGE Take 1 tablet (1,000 mg total) by mouth 2 (two) times daily with a meal.   multivitamin tablet Take 1 tablet by mouth daily.        ROS:  A comprehensive review of systems was negative except for: Integument/breast: positive for left breast tenderess, induration of nipple area, minimal drainage, reports history of intermittent drainage from the left and right nipple in the past  Blood pressure Marland Kitchen)  146/87, pulse 86, temperature 97.7 F (36.5 C), resp. rate 18, height 5\' 6"  (1.676 m), weight 199 lb (90.3 kg), last menstrual period 08/02/2017. Physical Exam  Constitutional: She is oriented to person, place, and time and well-developed, well-nourished, and in no distress.  HENT:  Head: Normocephalic.  Eyes: Pupils are equal, round, and reactive to light.  Neck: Normal range of motion.  Cardiovascular: Normal rate.  Pulmonary/Chest: Effort normal.  Left breast tender, indurated over nipple, no fluctuance, minimal drainage, incision healing up   Abdominal: Soft. She exhibits no distension. There is no tenderness.  Musculoskeletal: Normal range of motion.  Neurological: She is alert and oriented to person, place, and time.  Skin: Skin is warm and dry.  Vitals reviewed.   Results: None   Assessment & Plan:  Shari Collins is a  41 y.o. female with a left breast abscess s/p I&D in the ED. Otherwise doing well.  -Complete antibiotics  -Will follow up in 3 weeks for wound check  -Mammogram already scheduled for February she reports, will need to decide if patients any ductogram due to the history of nipple drainage and the history of 2 breast abscesses inferior to the areola on the left   All questions were answered to the satisfaction of the patient.   Virl Cagey 08/19/2017, 5:28 PM

## 2017-08-24 ENCOUNTER — Ambulatory Visit: Payer: Self-pay | Admitting: Physician Assistant

## 2017-08-24 ENCOUNTER — Encounter: Payer: Self-pay | Admitting: Physician Assistant

## 2017-08-24 VITALS — BP 144/88 | HR 77 | Temp 97.7°F | Ht 66.0 in | Wt 198.5 lb

## 2017-08-24 DIAGNOSIS — N611 Abscess of the breast and nipple: Secondary | ICD-10-CM

## 2017-08-24 MED ORDER — ALBUTEROL SULFATE HFA 108 (90 BASE) MCG/ACT IN AERS: 2.0000 | INHALATION_SPRAY | Freq: Four times a day (QID) | RESPIRATORY_TRACT | 2 refills | Status: DC | PRN

## 2017-08-24 NOTE — Patient Instructions (Addendum)
[facility]  Thursday, September 02, 2017 2:30PM   Financial Counselor-  [phone]

## 2017-08-24 NOTE — Progress Notes (Signed)
BP (!) 144/88 (BP Location: Left Arm, Patient Position: Sitting, Cuff Size: Large)   Pulse 77   Temp 97.7 F (36.5 C) (Other (Comment))   Ht 5\' 6"  (1.676 m)   Wt 198 lb 8 oz (90 kg)   LMP 08/02/2017   SpO2 98%   BMI 32.04 kg/m    Subjective:    Patient ID: Shari Collins, female    DOB: 1975-12-04, 41 y.o.   MRN: [id number]  HPI: Shari Collins is a 41 y.o. female presenting on 08/24/2017 for Breast Problem   HPI   Pt continues on her antibiotics/keflex.  She saw surgeon several times and has appt to follow up with surgeon.  Pt states breast improved, but not completely well yet.   Relevant past medical, surgical, family and social history reviewed and updated as indicated. Interim medical history since our last visit reviewed. Allergies and medications reviewed and updated.   Current Outpatient Medications:  .  albuterol (PROVENTIL HFA;VENTOLIN HFA) 108 (90 BASE) MCG/ACT inhaler, Inhale 2 puffs into the lungs every 6 (six) hours as needed for wheezing or shortness of breath., Disp: , Rfl:  .  cephALEXin (KEFLEX) 500 MG capsule, Take 1 capsule (500 mg total) by mouth 4 (four) times daily., Disp: 40 capsule, Rfl: 0 .  HYDROcodone-acetaminophen (NORCO) 5-325 MG tablet, Take 1 tablet by mouth every 4 (four) hours as needed for severe pain., Disp: 15 tablet, Rfl: 0 .  ibuprofen (ADVIL,MOTRIN) 200 MG tablet, Take 800 mg as needed by mouth., Disp: , Rfl:  .  metFORMIN (GLUCOPHAGE) 1000 MG tablet, Take 1 tablet (1,000 mg total) by mouth 2 (two) times daily with a meal., Disp: 60 tablet, Rfl: 4 .  Multiple Vitamin (MULTIVITAMIN) tablet, Take 1 tablet by mouth daily., Disp: , Rfl:  .  Omega-3 Fatty Acids (FISH OIL) 1000 MG CAPS, Take 1 capsule (1,000 mg total) by mouth 2 (two) times daily., Disp: , Rfl: 0 .  hydrOXYzine (VISTARIL) 25 MG capsule, Take 1 capsule (25 mg total) 3 (three) times daily as needed by mouth. (Patient not taking: Reported on 08/10/2017), Disp: 10 capsule, Rfl:  0   Review of Systems  Constitutional: Negative for appetite change, chills, diaphoresis, fatigue, fever and unexpected weight change.  HENT: Negative for congestion, dental problem, drooling, ear pain, facial swelling, hearing loss, mouth sores, sneezing, sore throat, trouble swallowing and voice change.   Eyes: Negative for pain, discharge, redness, itching and visual disturbance.  Respiratory: Negative for cough, choking, shortness of breath and wheezing.   Cardiovascular: Negative for chest pain, palpitations and leg swelling.  Gastrointestinal: Negative for abdominal pain, blood in stool, constipation, diarrhea and vomiting.  Endocrine: Negative for cold intolerance, heat intolerance and polydipsia.  Genitourinary: Negative for decreased urine volume, dysuria and hematuria.  Musculoskeletal: Negative for arthralgias, back pain and gait problem.  Skin: Negative for rash.  Allergic/Immunologic: Negative for environmental allergies.  Neurological: Negative for seizures, syncope, light-headedness and headaches.  Hematological: Negative for adenopathy.  Psychiatric/Behavioral: Negative for agitation, dysphoric mood and suicidal ideas. The patient is not nervous/anxious.     Per HPI unless specifically indicated above     Objective:    BP (!) 144/88 (BP Location: Left Arm, Patient Position: Sitting, Cuff Size: Large)   Pulse 77   Temp 97.7 F (36.5 C) (Other (Comment))   Ht 5\' 6"  (1.676 m)   Wt 198 lb 8 oz (90 kg)   LMP 08/02/2017   SpO2 98%   BMI 32.04  kg/m   Wt Readings from Last 3 Encounters:  08/24/17 198 lb 8 oz (90 kg)  08/19/17 199 lb (90.3 kg)  08/14/17 200 lb (90.7 kg)    Physical Exam  Constitutional: She is oriented to person, place, and time. She appears well-developed and well-nourished.  HENT:  Head: Normocephalic and atraumatic.  Pulmonary/Chest: Effort normal. No respiratory distress. Left breast exhibits tenderness. Left breast exhibits no inverted nipple  and no nipple discharge.  L breast still with firmness around the nipple although it is much smaller than at previous OV.  There is very mild tenderness to the area.  No active drainage.    Neurological: She is alert and oriented to person, place, and time.  Skin: Skin is warm and dry.  Psychiatric: She has a normal mood and affect. Her behavior is normal.  Nursing note and vitals reviewed.       Assessment & Plan:   Encounter Diagnosis  Name Primary?  . Breast abscess Yes    -pt counseled to finish antibiotics -recommended warm compress -pt appt for mammogram was moved up. She is given appt information- it is on 09/02/17 -urged pt to get [facility] application turned in -pt to follow up with surgeon as scheduled -follow up here as scheduled.  RTO sooner prn

## 2017-09-01 ENCOUNTER — Encounter: Payer: Self-pay | Admitting: Obstetrics and Gynecology

## 2017-09-02 ENCOUNTER — Other Ambulatory Visit (HOSPITAL_COMMUNITY): Payer: Self-pay | Admitting: *Deleted

## 2017-09-02 DIAGNOSIS — R229 Localized swelling, mass and lump, unspecified: Principal | ICD-10-CM

## 2017-09-02 DIAGNOSIS — IMO0002 Reserved for concepts with insufficient information to code with codable children: Secondary | ICD-10-CM

## 2017-09-08 ENCOUNTER — Other Ambulatory Visit (HOSPITAL_COMMUNITY): Payer: Self-pay | Admitting: *Deleted

## 2017-09-08 ENCOUNTER — Ambulatory Visit (HOSPITAL_COMMUNITY)
Admission: RE | Admit: 2017-09-08 | Discharge: 2017-09-08 | Disposition: A | Discharge status: Home / Self Care | Payer: PRIVATE HEALTH INSURANCE | Source: Ambulatory Visit | Attending: *Deleted | Admitting: *Deleted

## 2017-09-08 DIAGNOSIS — R229 Localized swelling, mass and lump, unspecified: Secondary | ICD-10-CM

## 2017-09-08 DIAGNOSIS — IMO0002 Reserved for concepts with insufficient information to code with codable children: Secondary | ICD-10-CM

## 2017-09-08 DIAGNOSIS — D242 Benign neoplasm of left breast: Secondary | ICD-10-CM | POA: Insufficient documentation

## 2017-09-09 ENCOUNTER — Ambulatory Visit: Payer: Self-pay | Admitting: General Surgery

## 2017-09-23 ENCOUNTER — Encounter: Payer: Self-pay | Admitting: Obstetrics and Gynecology

## 2017-10-04 ENCOUNTER — Encounter: Payer: Self-pay | Admitting: Obstetrics and Gynecology

## 2017-10-12 ENCOUNTER — Ambulatory Visit: Payer: PRIVATE HEALTH INSURANCE | Admitting: General Surgery

## 2017-10-20 ENCOUNTER — Encounter: Payer: Self-pay | Admitting: Physician Assistant

## 2017-10-20 ENCOUNTER — Ambulatory Visit: Payer: Self-pay | Admitting: Physician Assistant

## 2017-10-20 VITALS — BP 133/83 | HR 86 | Temp 97.6°F | Wt 201.5 lb

## 2017-10-20 DIAGNOSIS — F17219 Nicotine dependence, cigarettes, with unspecified nicotine-induced disorders: Secondary | ICD-10-CM

## 2017-10-20 DIAGNOSIS — E1165 Type 2 diabetes mellitus with hyperglycemia: Secondary | ICD-10-CM

## 2017-10-20 DIAGNOSIS — Z794 Long term (current) use of insulin: Principal | ICD-10-CM

## 2017-10-20 DIAGNOSIS — R87612 Low grade squamous intraepithelial lesion on cytologic smear of cervix (LGSIL): Secondary | ICD-10-CM

## 2017-10-20 DIAGNOSIS — E785 Hyperlipidemia, unspecified: Secondary | ICD-10-CM

## 2017-10-20 DIAGNOSIS — E118 Type 2 diabetes mellitus with unspecified complications: Principal | ICD-10-CM

## 2017-10-20 DIAGNOSIS — IMO0002 Reserved for concepts with insufficient information to code with codable children: Secondary | ICD-10-CM

## 2017-10-20 DIAGNOSIS — N611 Abscess of the breast and nipple: Secondary | ICD-10-CM

## 2017-10-20 MED ORDER — CLINDAMYCIN HCL 300 MG PO CAPS: 300.0000 mg | ORAL_CAPSULE | Freq: Three times a day (TID) | ORAL | 0 refills | Status: DC

## 2017-10-20 NOTE — Progress Notes (Signed)
BP 133/83   Pulse 86   Temp 97.6 F (36.4 C)   Wt 201 lb 8 oz (91.4 kg)   SpO2 99%   BMI 32.52 kg/m    Subjective:    Patient ID: Shari Collins, female    DOB: 1976-07-02, 42 y.o.   MRN: [id number]  HPI: Shari Collins is a 42 y.o. female presenting on 10/20/2017 for Diabetes and Hyperlipidemia   HPI Pt states L breast abscess is back in same place.  She started taking some keflex that she had "leftover" from the last time.  She denies drainage, fever.   Pt decided not to go to gyn appt in December for follow up on abnormal PAP.    Relevant past medical, surgical, family and social history reviewed and updated as indicated. Interim medical history since our last visit reviewed. Allergies and medications reviewed and updated.   Current Outpatient Medications:  .  albuterol (PROVENTIL HFA;VENTOLIN HFA) 108 (90 Base) MCG/ACT inhaler, Inhale 2 puffs into the lungs every 6 (six) hours as needed for wheezing or shortness of breath., Disp: 1 Inhaler, Rfl: 2 .  cephALEXin (KEFLEX) 500 MG capsule, Take 1 capsule (500 mg total) by mouth 4 (four) times daily., Disp: 40 capsule, Rfl: 0 .  ibuprofen (ADVIL,MOTRIN) 200 MG tablet, Take 800 mg as needed by mouth., Disp: , Rfl:  .  metFORMIN (GLUCOPHAGE) 1000 MG tablet, Take 1 tablet (1,000 mg total) by mouth 2 (two) times daily with a meal., Disp: 60 tablet, Rfl: 4 .  Omega-3 Fatty Acids (FISH OIL) 1000 MG CAPS, Take 1 capsule (1,000 mg total) by mouth 2 (two) times daily., Disp: , Rfl: 0   Review of Systems  Constitutional: Negative for appetite change, chills, diaphoresis, fatigue, fever and unexpected weight change.  HENT: Negative for congestion, dental problem, drooling, ear pain, facial swelling, hearing loss, mouth sores, sneezing, sore throat, trouble swallowing and voice change.   Eyes: Negative for pain, discharge, redness, itching and visual disturbance.  Respiratory: Positive for cough and wheezing. Negative for choking and  shortness of breath.   Cardiovascular: Negative for chest pain, palpitations and leg swelling.  Gastrointestinal: Negative for abdominal pain, blood in stool, constipation, diarrhea and vomiting.  Endocrine: Negative for cold intolerance, heat intolerance and polydipsia.  Genitourinary: Negative for decreased urine volume, dysuria and hematuria.  Musculoskeletal: Negative for arthralgias, back pain and gait problem.  Skin: Negative for rash.  Allergic/Immunologic: Negative for environmental allergies.  Neurological: Negative for seizures, syncope, light-headedness and headaches.  Hematological: Negative for adenopathy.  Psychiatric/Behavioral: Negative for agitation, dysphoric mood and suicidal ideas. The patient is not nervous/anxious.     Per HPI unless specifically indicated above     Objective:    BP 133/83   Pulse 86   Temp 97.6 F (36.4 C)   Wt 201 lb 8 oz (91.4 kg)   SpO2 99%   BMI 32.52 kg/m   Wt Readings from Last 3 Encounters:  10/20/17 201 lb 8 oz (91.4 kg)  08/24/17 198 lb 8 oz (90 kg)  08/19/17 199 lb (90.3 kg)    Physical Exam  Constitutional: She is oriented to person, place, and time. She appears well-developed and well-nourished.  HENT:  Head: Normocephalic and atraumatic.  Neck: Neck supple.  Cardiovascular: Normal rate and regular rhythm.  Pulmonary/Chest: Effort normal and breath sounds normal. Left breast exhibits mass (abscess) and tenderness. Left breast exhibits no nipple discharge.    Left breast abscess around nipple. No discharge.  No fluctuance.  Abdominal: Soft. Bowel sounds are normal. She exhibits no mass. There is no hepatosplenomegaly. There is no tenderness.  Musculoskeletal: She exhibits no edema.  Lymphadenopathy:    She has no cervical adenopathy.  Neurological: She is alert and oriented to person, place, and time.  Skin: Skin is warm and dry.  Psychiatric: She has a normal mood and affect. Her behavior is normal.  Vitals  reviewed.       Assessment & Plan:    Encounter Diagnoses  Name Primary?  Marland Kitchen Uncontrolled type 2 diabetes mellitus with complication, with long-term current use of insulin ([place]) Yes  . Hyperlipidemia, unspecified hyperlipidemia type   . Breast abscess   . 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 Cigarette nicotine dependence with nicotine-induced disorder     -check fasting labs tomorrow morning -rx clindamycin (handwritten rx given to pt due to no internet while pt in office) -counseled pt on finishing complete antibiotic rx and not having leftovers to take later -renewed [employer] application -pt is given another [facility] [employer] [facility] application -will refer pt back to dr Bridges/surgeon for recurrence of her left breast abscess -pt to follow up here 2 weeks.  She is to RTO sooner if her breast worsens

## 2017-10-27 ENCOUNTER — Ambulatory Visit: Payer: Self-pay | Admitting: Physician Assistant

## 2017-10-29 ENCOUNTER — Other Ambulatory Visit (HOSPITAL_COMMUNITY)
Admission: RE | Admit: 2017-10-29 | Discharge: 2017-10-29 | Disposition: A | Discharge status: Home / Self Care | Payer: Self-pay | Source: Ambulatory Visit | Attending: Physician Assistant | Admitting: Physician Assistant

## 2017-10-29 DIAGNOSIS — E785 Hyperlipidemia, unspecified: Secondary | ICD-10-CM | POA: Insufficient documentation

## 2017-10-29 DIAGNOSIS — E1165 Type 2 diabetes mellitus with hyperglycemia: Secondary | ICD-10-CM | POA: Insufficient documentation

## 2017-10-29 DIAGNOSIS — N611 Abscess of the breast and nipple: Secondary | ICD-10-CM | POA: Insufficient documentation

## 2017-10-29 DIAGNOSIS — E118 Type 2 diabetes mellitus with unspecified complications: Secondary | ICD-10-CM | POA: Insufficient documentation

## 2017-10-29 DIAGNOSIS — IMO0002 Reserved for concepts with insufficient information to code with codable children: Secondary | ICD-10-CM

## 2017-10-29 DIAGNOSIS — Z794 Long term (current) use of insulin: Secondary | ICD-10-CM | POA: Insufficient documentation

## 2017-10-29 LAB — CBC
HEMATOCRIT: 40.2 % (ref 36.0–46.0)
HEMOGLOBIN: 13.7 g/dL (ref 12.0–15.0)
MCH: 31.3 pg (ref 26.0–34.0)
MCHC: 34.1 g/dL (ref 30.0–36.0)
MCV: 91.8 fL (ref 78.0–100.0)
Platelets: 366 10*3/uL (ref 150–400)
RBC: 4.38 MIL/uL (ref 3.87–5.11)
RDW: 13.9 % (ref 11.5–15.5)
WBC: 12 10*3/uL — AB (ref 4.0–10.5)

## 2017-10-29 LAB — LIPID PANEL
CHOL/HDL RATIO: 6 ratio
Cholesterol: 229 mg/dL — ABNORMAL HIGH (ref 0–200)
HDL: 38 mg/dL — ABNORMAL LOW (ref >40)
LDL CALC: UNDETERMINED mg/dL (ref 0–99)
TRIGLYCERIDES: 410 mg/dL — AB (ref <150)
VLDL: UNDETERMINED mg/dL (ref 0–40)

## 2017-10-29 LAB — COMPREHENSIVE METABOLIC PANEL
ALT: 13 U/L — ABNORMAL LOW (ref 14–54)
ANION GAP: 11 (ref 5–15)
AST: 13 U/L — ABNORMAL LOW (ref 15–41)
Albumin: 3.9 g/dL (ref 3.5–5.0)
Alkaline Phosphatase: 67 U/L (ref 38–126)
BUN: 12 mg/dL (ref 6–20)
CHLORIDE: 102 mmol/L (ref 101–111)
CO2: 23 mmol/L (ref 22–32)
Calcium: 8.9 mg/dL (ref 8.9–10.3)
Creatinine, Ser: 0.69 mg/dL (ref 0.44–1.00)
Glucose, Bld: 187 mg/dL — ABNORMAL HIGH (ref 65–99)
POTASSIUM: 3.6 mmol/L (ref 3.5–5.1)
Sodium: 136 mmol/L (ref 135–145)
TOTAL PROTEIN: 7.2 g/dL (ref 6.5–8.1)
Total Bilirubin: 0.5 mg/dL (ref 0.3–1.2)

## 2017-10-30 LAB — HEMOGLOBIN A1C
Hgb A1c MFr Bld: 9.5 % — ABNORMAL HIGH (ref 4.8–5.6)
Mean Plasma Glucose: 226 mg/dL

## 2017-11-01 ENCOUNTER — Ambulatory Visit: Payer: Self-pay | Admitting: Physician Assistant

## 2017-11-02 ENCOUNTER — Ambulatory Visit: Payer: PRIVATE HEALTH INSURANCE | Admitting: General Surgery

## 2017-11-03 ENCOUNTER — Encounter: Payer: Self-pay | Admitting: Physician Assistant

## 2017-11-03 ENCOUNTER — Ambulatory Visit: Payer: Self-pay | Admitting: Physician Assistant

## 2017-11-03 VITALS — BP 146/89 | HR 64 | Temp 98.1°F | Ht 66.0 in | Wt 205.0 lb

## 2017-11-03 DIAGNOSIS — Z91199 Patient's noncompliance with other medical treatment and regimen due to unspecified reason: Secondary | ICD-10-CM

## 2017-11-03 DIAGNOSIS — N6320 Unspecified lump in the left breast, unspecified quadrant: Secondary | ICD-10-CM

## 2017-11-03 DIAGNOSIS — I10 Essential (primary) hypertension: Secondary | ICD-10-CM

## 2017-11-03 DIAGNOSIS — Z794 Long term (current) use of insulin: Principal | ICD-10-CM

## 2017-11-03 DIAGNOSIS — F17219 Nicotine dependence, cigarettes, with unspecified nicotine-induced disorders: Secondary | ICD-10-CM

## 2017-11-03 DIAGNOSIS — Z9119 Patient's noncompliance with other medical treatment and regimen: Secondary | ICD-10-CM

## 2017-11-03 DIAGNOSIS — E118 Type 2 diabetes mellitus with unspecified complications: Principal | ICD-10-CM

## 2017-11-03 DIAGNOSIS — IMO0002 Reserved for concepts with insufficient information to code with codable children: Secondary | ICD-10-CM

## 2017-11-03 DIAGNOSIS — F39 Unspecified mood [affective] disorder: Secondary | ICD-10-CM

## 2017-11-03 DIAGNOSIS — E785 Hyperlipidemia, unspecified: Secondary | ICD-10-CM

## 2017-11-03 DIAGNOSIS — J449 Chronic obstructive pulmonary disease, unspecified: Secondary | ICD-10-CM

## 2017-11-03 DIAGNOSIS — E1165 Type 2 diabetes mellitus with hyperglycemia: Secondary | ICD-10-CM

## 2017-11-03 MED ORDER — LISINOPRIL 10 MG PO TABS: 10.0000 mg | ORAL_TABLET | Freq: Every day | ORAL | 3 refills | Status: DC

## 2017-11-03 MED ORDER — INSULIN GLARGINE 100 UNIT/ML SOLOSTAR PEN: 10.0000 [IU] | PEN_INJECTOR | Freq: Every day | SUBCUTANEOUS | 0 refills | Status: DC

## 2017-11-03 MED ORDER — FISH OIL 1000 MG PO CAPS: 1.0000 | ORAL_CAPSULE | Freq: Three times a day (TID) | ORAL | 0 refills | Status: DC

## 2017-11-03 NOTE — Progress Notes (Signed)
BP (!) 146/89   Pulse 64   Temp 98.1 F (36.7 C)   Ht 5\' 6"  (1.676 m)   Wt 205 lb (93 kg)   SpO2 100%   BMI 33.09 kg/m    Subjective:    Patient ID: Shari Collins, female    DOB: February 18, 1976, 42 y.o.   MRN: [id number]  HPI: Shari Collins is a 42 y.o. female presenting on 11/03/2017 for Follow-up   HPI   Pt doesn't take her fish oil regularly and she just refilled her metformin. She was out of it for 3 weeks.   Pt was a no-show to her appointment with surgery for her breast abscess.  She says it is improved.    She says she turned in her [facility] [employer] [facility] application  Pt requests to get back on insulin- lantus  Relevant past medical, surgical, family and social history reviewed and updated as indicated. Interim medical history since our last visit reviewed. Allergies and medications reviewed and updated.   Current Outpatient Medications:  .  albuterol (PROVENTIL HFA;VENTOLIN HFA) 108 (90 Base) MCG/ACT inhaler, Inhale 2 puffs into the lungs every 6 (six) hours as needed for wheezing or shortness of breath., Disp: 1 Inhaler, Rfl: 2 .  ibuprofen (ADVIL,MOTRIN) 200 MG tablet, Take 800 mg as needed by mouth., Disp: , Rfl:  .  metFORMIN (GLUCOPHAGE) 1000 MG tablet, Take 1 tablet (1,000 mg total) by mouth 2 (two) times daily with a meal., Disp: 60 tablet, Rfl: 4 .  Omega-3 Fatty Acids (FISH OIL) 1000 MG CAPS, Take 1 capsule (1,000 mg total) by mouth 2 (two) times daily., Disp: , Rfl: 0   Review of Systems  Constitutional: Negative for appetite change, chills, diaphoresis, fatigue, fever and unexpected weight change.  HENT: Negative for congestion, dental problem, drooling, ear pain, facial swelling, hearing loss, mouth sores, sneezing, sore throat, trouble swallowing and voice change.   Eyes: Negative for pain, discharge, redness, itching and visual disturbance.  Respiratory: Negative for cough, choking, shortness of breath and wheezing.   Cardiovascular: Negative for chest  pain, palpitations and leg swelling.  Gastrointestinal: Negative for abdominal pain, blood in stool, constipation, diarrhea and vomiting.  Endocrine: Negative for cold intolerance, heat intolerance and polydipsia.  Genitourinary: Negative for decreased urine volume, dysuria and hematuria.  Musculoskeletal: Negative for arthralgias, back pain and gait problem.  Skin: Negative for rash.  Allergic/Immunologic: Negative for environmental allergies.  Neurological: Negative for seizures, syncope, light-headedness and headaches.  Hematological: Negative for adenopathy.  Psychiatric/Behavioral: Negative for agitation, dysphoric mood and suicidal ideas. The patient is not nervous/anxious.     Per HPI unless specifically indicated above     Objective:    BP (!) 146/89   Pulse 64   Temp 98.1 F (36.7 C)   Ht 5\' 6"  (1.676 m)   Wt 205 lb (93 kg)   SpO2 100%   BMI 33.09 kg/m   Wt Readings from Last 3 Encounters:  11/03/17 205 lb (93 kg)  10/20/17 201 lb 8 oz (91.4 kg)  08/24/17 198 lb 8 oz (90 kg)    Physical Exam  Constitutional: She is oriented to person, place, and time. She appears well-developed and well-nourished.  HENT:  Head: Normocephalic and atraumatic.  Neck: Neck supple.  Cardiovascular: Normal rate and regular rhythm.  Pulmonary/Chest: Effort normal and breath sounds normal. Left breast exhibits mass. Left breast exhibits no nipple discharge and no tenderness.  Left breast mass much smaller than at previous office  visit.  There is no defined margins.  There is still some hard tissue next to the nipple  Abdominal: Soft. Bowel sounds are normal. She exhibits no mass. There is no hepatosplenomegaly. There is no tenderness.  Musculoskeletal: She exhibits no edema.  Lymphadenopathy:    She has no cervical adenopathy.  Neurological: She is alert and oriented to person, place, and time.  Skin: Skin is warm and dry.  Psychiatric: She has a normal mood and affect. Her behavior is  normal.  Vitals reviewed.   Results for orders placed or performed during the hospital encounter of 10/29/17  Lipid panel  Result Value Ref Range   Cholesterol 229 (H) 0 - 200 mg/dL   Triglycerides 410 (H) <150 mg/dL   HDL 38 (L) >40 mg/dL   Total CHOL/HDL Ratio 6.0 RATIO   VLDL UNABLE TO CALCULATE IF TRIGLYCERIDE OVER 400 mg/dL 0 - 40 mg/dL   LDL Cholesterol UNABLE TO CALCULATE IF TRIGLYCERIDE OVER 400 mg/dL 0 - 99 mg/dL  Comprehensive metabolic panel  Result Value Ref Range   Sodium 136 135 - 145 mmol/L   Potassium 3.6 3.5 - 5.1 mmol/L   Chloride 102 101 - 111 mmol/L   CO2 23 22 - 32 mmol/L   Glucose, Bld 187 (H) 65 - 99 mg/dL   BUN 12 6 - 20 mg/dL   Creatinine, Ser 0.69 0.44 - 1.00 mg/dL   Calcium 8.9 8.9 - 10.3 mg/dL   Total Protein 7.2 6.5 - 8.1 g/dL   Albumin 3.9 3.5 - 5.0 g/dL   AST 13 (L) 15 - 41 U/L   ALT 13 (L) 14 - 54 U/L   Alkaline Phosphatase 67 38 - 126 U/L   Total Bilirubin 0.5 0.3 - 1.2 mg/dL   GFR calc non Af Amer >60 >60 mL/min   GFR calc Af Amer >60 >60 mL/min   Anion gap 11 5 - 15  Hemoglobin A1c  Result Value Ref Range   Hgb A1c MFr Bld 9.5 (H) 4.8 - 5.6 %   Mean Plasma Glucose 226 mg/dL  CBC  Result Value Ref Range   WBC 12.0 (H) 4.0 - 10.5 K/uL   RBC 4.38 3.87 - 5.11 MIL/uL   Hemoglobin 13.7 12.0 - 15.0 g/dL   HCT 40.2 36.0 - 46.0 %   MCV 91.8 78.0 - 100.0 fL   MCH 31.3 26.0 - 34.0 pg   MCHC 34.1 30.0 - 36.0 g/dL   RDW 13.9 11.5 - 15.5 %   Platelets 366 150 - 400 K/uL      Assessment & Plan:   Encounter Diagnoses  Name Primary?  Marland Kitchen Uncontrolled type 2 diabetes mellitus with complication, with long-term current use of insulin ([place]) Yes  . Hyperlipidemia, unspecified hyperlipidemia type   . Cigarette nicotine dependence with nicotine-induced disorder   . Breast mass, left   . Mood disorder ([place])   . Chronic obstructive pulmonary disease, unspecified COPD type ([place])   . Personal history of noncompliance with medical treatment, presenting  hazards to health   . Essential hypertension     -reviewed labs with pt -Increase fish oil to 3 daily -Add lisinopril -Restart lantus 10u qhs -pt to Monitor fbs each morning and call office if > 300 or < 70 -pt to follow up 1 month to recheck blood pressure and review bs log.  RTO sooner prn

## 2017-11-03 NOTE — Patient Instructions (Signed)
Increase fish oil to 3 capsules daily

## 2017-11-28 ENCOUNTER — Other Ambulatory Visit: Payer: Self-pay

## 2017-11-28 ENCOUNTER — Emergency Department (HOSPITAL_COMMUNITY): Payer: Self-pay

## 2017-11-28 ENCOUNTER — Emergency Department (HOSPITAL_COMMUNITY)
Admission: EM | Admit: 2017-11-28 | Discharge: 2017-11-29 | Disposition: A | Discharge status: Home / Self Care | Payer: PRIVATE HEALTH INSURANCE | Attending: Emergency Medicine | Admitting: Emergency Medicine

## 2017-11-28 ENCOUNTER — Encounter (HOSPITAL_COMMUNITY): Payer: Self-pay | Admitting: Emergency Medicine

## 2017-11-28 DIAGNOSIS — F319 Bipolar disorder, unspecified: Secondary | ICD-10-CM | POA: Diagnosis present

## 2017-11-28 DIAGNOSIS — F32A Depression, unspecified: Secondary | ICD-10-CM

## 2017-11-28 DIAGNOSIS — R42 Dizziness and giddiness: Secondary | ICD-10-CM | POA: Insufficient documentation

## 2017-11-28 DIAGNOSIS — Z79899 Other long term (current) drug therapy: Secondary | ICD-10-CM | POA: Insufficient documentation

## 2017-11-28 DIAGNOSIS — J45909 Unspecified asthma, uncomplicated: Secondary | ICD-10-CM | POA: Insufficient documentation

## 2017-11-28 DIAGNOSIS — F17210 Nicotine dependence, cigarettes, uncomplicated: Secondary | ICD-10-CM | POA: Insufficient documentation

## 2017-11-28 DIAGNOSIS — Z794 Long term (current) use of insulin: Secondary | ICD-10-CM | POA: Insufficient documentation

## 2017-11-28 DIAGNOSIS — F329 Major depressive disorder, single episode, unspecified: Secondary | ICD-10-CM | POA: Insufficient documentation

## 2017-11-28 DIAGNOSIS — Z91040 Latex allergy status: Secondary | ICD-10-CM | POA: Insufficient documentation

## 2017-11-28 DIAGNOSIS — R45851 Suicidal ideations: Secondary | ICD-10-CM

## 2017-11-28 DIAGNOSIS — F3181 Bipolar II disorder: Secondary | ICD-10-CM | POA: Diagnosis present

## 2017-11-28 DIAGNOSIS — I10 Essential (primary) hypertension: Secondary | ICD-10-CM | POA: Insufficient documentation

## 2017-11-28 DIAGNOSIS — E119 Type 2 diabetes mellitus without complications: Secondary | ICD-10-CM | POA: Insufficient documentation

## 2017-11-28 LAB — CBG MONITORING, ED
GLUCOSE-CAPILLARY: 183 mg/dL — AB (ref 65–99)
Glucose-Capillary: 219 mg/dL — ABNORMAL HIGH (ref 65–99)

## 2017-11-28 LAB — CBC WITH DIFFERENTIAL/PLATELET
BASOS ABS: 0.1 10*3/uL (ref 0.0–0.1)
BASOS PCT: 1 %
Eosinophils Absolute: 0.4 10*3/uL (ref 0.0–0.7)
Eosinophils Relative: 5 %
HEMATOCRIT: 45.6 % (ref 36.0–46.0)
Hemoglobin: 15.6 g/dL — ABNORMAL HIGH (ref 12.0–15.0)
LYMPHS PCT: 15 %
Lymphs Abs: 1.2 10*3/uL (ref 0.7–4.0)
MCH: 31.5 pg (ref 26.0–34.0)
MCHC: 34.2 g/dL (ref 30.0–36.0)
MCV: 92.1 fL (ref 78.0–100.0)
Monocytes Absolute: 0.3 10*3/uL (ref 0.1–1.0)
Monocytes Relative: 4 %
NEUTROS ABS: 5.8 10*3/uL (ref 1.7–7.7)
NEUTROS PCT: 75 %
Platelets: 281 10*3/uL (ref 150–400)
RBC: 4.95 MIL/uL (ref 3.87–5.11)
RDW: 14.2 % (ref 11.5–15.5)
WBC: 7.7 10*3/uL (ref 4.0–10.5)

## 2017-11-28 LAB — RAPID URINE DRUG SCREEN, HOSP PERFORMED
AMPHETAMINES: NOT DETECTED
BENZODIAZEPINES: POSITIVE — AB
Barbiturates: NOT DETECTED
Cocaine: NOT DETECTED
OPIATES: NOT DETECTED
Tetrahydrocannabinol: NOT DETECTED

## 2017-11-28 LAB — BASIC METABOLIC PANEL
ANION GAP: 10 (ref 5–15)
BUN: 10 mg/dL (ref 6–20)
CALCIUM: 8.7 mg/dL — AB (ref 8.9–10.3)
CO2: 24 mmol/L (ref 22–32)
Chloride: 103 mmol/L (ref 101–111)
Creatinine, Ser: 0.63 mg/dL (ref 0.44–1.00)
GLUCOSE: 241 mg/dL — AB (ref 65–99)
POTASSIUM: 3.6 mmol/L (ref 3.5–5.1)
Sodium: 137 mmol/L (ref 135–145)

## 2017-11-28 LAB — ETHANOL

## 2017-11-28 LAB — I-STAT BETA HCG BLOOD, ED (MC, WL, AP ONLY): I-stat hCG, quantitative: <5 m[IU]/mL (ref <5)

## 2017-11-28 MED ORDER — ALBUTEROL SULFATE HFA 108 (90 BASE) MCG/ACT IN AERS
2.0000 | INHALATION_SPRAY | Freq: Four times a day (QID) | RESPIRATORY_TRACT | Status: DC | PRN
  Filled 2017-11-28: qty 6.7

## 2017-11-28 MED ORDER — MECLIZINE HCL 12.5 MG PO TABS
25.0000 mg | ORAL_TABLET | Freq: Once | ORAL | Status: AC
  Administered 2017-11-28: 25 mg via ORAL
  Filled 2017-11-28: qty 2

## 2017-11-28 MED ORDER — LORAZEPAM 1 MG PO TABS
1.0000 mg | ORAL_TABLET | Freq: Once | ORAL | Status: AC
  Administered 2017-11-28: 1 mg via ORAL
  Filled 2017-11-28: qty 1

## 2017-11-28 MED ORDER — NICOTINE 21 MG/24HR TD PT24
21.0000 mg | MEDICATED_PATCH | Freq: Once | TRANSDERMAL | Status: AC
  Administered 2017-11-28: 21 mg via TRANSDERMAL
  Filled 2017-11-28: qty 1

## 2017-11-28 MED ORDER — INSULIN GLARGINE 100 UNIT/ML ~~LOC~~ SOLN
10.0000 [IU] | Freq: Every day | SUBCUTANEOUS | Status: DC
  Administered 2017-11-28: 10 [IU] via SUBCUTANEOUS
  Filled 2017-11-28 (×2): qty 0.1

## 2017-11-28 MED ORDER — METFORMIN HCL 500 MG PO TABS
1000.0000 mg | ORAL_TABLET | Freq: Two times a day (BID) | ORAL | Status: DC
  Administered 2017-11-28 – 2017-11-29 (×2): 1000 mg via ORAL
  Filled 2017-11-28 (×2): qty 2

## 2017-11-28 MED ORDER — HYDROXYZINE HCL 25 MG PO TABS
25.0000 mg | ORAL_TABLET | Freq: Four times a day (QID) | ORAL | Status: DC | PRN
  Administered 2017-11-28 – 2017-11-29 (×2): 25 mg via ORAL
  Filled 2017-11-28 (×2): qty 1

## 2017-11-28 MED ORDER — LOPERAMIDE HCL 2 MG PO CAPS
2.0000 mg | ORAL_CAPSULE | Freq: Once | ORAL | Status: AC
  Administered 2017-11-28: 2 mg via ORAL
  Filled 2017-11-28: qty 1

## 2017-11-28 MED ORDER — LISINOPRIL 10 MG PO TABS
10.0000 mg | ORAL_TABLET | Freq: Every day | ORAL | Status: DC
  Administered 2017-11-28 – 2017-11-29 (×2): 10 mg via ORAL
  Filled 2017-11-28 (×2): qty 1

## 2017-11-28 MED ORDER — ACETAMINOPHEN 325 MG PO TABS
650.0000 mg | ORAL_TABLET | Freq: Once | ORAL | Status: AC
  Administered 2017-11-28: 650 mg via ORAL
  Filled 2017-11-28: qty 2

## 2017-11-28 NOTE — ED Notes (Signed)
Pt wanded by security. 

## 2017-11-28 NOTE — ED Provider Notes (Signed)
[facility] EMERGENCY DEPARTMENT Provider Note   CSN: [id number] Arrival date & time: 11/28/17  0545     History   Chief Complaint Chief Complaint  Patient presents with  . Dizziness  . Panic Attack    HPI Shari Collins is a 42 y.o. female.  The history is provided by the patient.  She has history of diabetes, hypertension, asthma, depression, schizophrenia and comes in complaining of dizziness which started yesterday.  She states that it is a drunk feeling.  Nothing makes it better, nothing makes it worse.  She has been off balance but denies nausea or vomiting.  She is complaining of ear pain, tinnitus but denies hearing loss.  Past Medical History:  Diagnosis Date  . Asthma   . Depression   . Diabetes mellitus   . Hypertension   . Noncompliance     Patient Active Problem List   Diagnosis Date Noted  . Hyperlipidemia 10/07/2015  . Breast abscess 10/02/2015  . Schizophrenia ([place]) 10/02/2015  . Bipolar disorder, unspecified ([place]) 10/02/2015  . Uncontrolled type 2 diabetes mellitus with complication ([place]) 44/09/270  . Personal history of noncompliance with medical treatment, presenting hazards to health 09/24/2015    Past Surgical History:  Procedure Laterality Date  . CESAREAN SECTION    . TONSILLECTOMY       OB History    Gravida  4   Para  1   Term  1   Preterm      AB  3   Living  1     SAB  3   TAB      Ectopic      Multiple      Live Births  1            Home Medications    Prior to Admission medications   Medication Sig Start Date End Date Taking? Authorizing Provider  albuterol (PROVENTIL HFA;VENTOLIN HFA) 108 (90 Base) MCG/ACT inhaler Inhale 2 puffs into the lungs every 6 (six) hours as needed for wheezing or shortness of breath. 08/24/17  Yes Soyla Dryer, PA-C  ibuprofen (ADVIL,MOTRIN) 200 MG tablet Take 800 mg as needed by mouth.   Yes [provider]  Insulin Glargine (LANTUS SOLOSTAR) 100 UNIT/ML Solostar  Pen Inject 10 Units into the skin at bedtime. 11/03/17  Yes Soyla Dryer, PA-C  lisinopril (PRINIVIL,ZESTRIL) 10 MG tablet Take 1 tablet (10 mg total) by mouth daily. 11/03/17  Yes Soyla Dryer, PA-C  metFORMIN (GLUCOPHAGE) 1000 MG tablet Take 1 tablet (1,000 mg total) by mouth 2 (two) times daily with a meal. 05/31/17  Yes Soyla Dryer, PA-C  Omega-3 Fatty Acids (FISH OIL) 1000 MG CAPS Take 1 capsule (1,000 mg total) by mouth 3 (three) times daily. 11/03/17  Yes Soyla Dryer, PA-C    Family History Family History  Problem Relation Age of Onset  . Hypertension Paternal Grandmother   . Hypertension Maternal Grandmother   . Diabetes Father   . Hypertension Father   . Cancer Mother        liver  . Heart attack Paternal Grandfather   . Diabetes Brother     Social History Social History   Tobacco Use  . Smoking status: Current Every Day Smoker    Packs/day: 0.50    Years: 20.00    Pack years: 10.00    Types: Cigarettes  . Smokeless tobacco: Never Used  Substance Use Topics  . Alcohol use: Yes    Comment: everyday  . Drug  use: No     Allergies   Bee venom; Buspar [buspirone]; Latex; Reglan [metoclopramide]; and Sulfa antibiotics   Review of Systems Review of Systems  All other systems reviewed and are negative.    Physical Exam Updated Vital Signs BP (!) 187/109 (BP Location: Right Arm)   Pulse 78   Temp 98.1 F (36.7 C) (Oral)   Resp 18   Ht 5\' 6"  (1.676 m)   Wt 90.7 kg (200 lb)   LMP 11/16/2017   SpO2 100%   BMI 32.28 kg/m   Physical Exam  Nursing note and vitals reviewed.  42 year old female, resting comfortably and in no acute distress. Vital signs are significant for elevated blood pressure. Oxygen saturation is 100%, which is normal. Head is normocephalic and atraumatic. PERRLA, EOMI. Oropharynx is clear.  Tympanic membranes are clear. Neck is nontender and supple without adenopathy or JVD. Back is nontender and there is no CVA  tenderness. Lungs are clear without rales, wheezes, or rhonchi. Chest is nontender. Heart has regular rate and rhythm without murmur. Abdomen is soft, flat, nontender without masses or hepatosplenomegaly and peristalsis is normoactive. Extremities have no cyanosis or edema, full range of motion is present. Skin is warm and dry without rash. Neurologic: Mental status is normal, cranial nerves are intact, there are no motor or sensory deficits.  No nystagmus present.  Dizziness is reproduced by passive head movement.  Finger-nose testing is normal.  ED Treatments / Results  Labs (all labs ordered are listed, but only abnormal results are displayed) Labs Reviewed  CBC WITH DIFFERENTIAL/PLATELET  BASIC METABOLIC PANEL  I-STAT BETA HCG BLOOD, ED ([facility], [facility], [facility] ONLY)   Procedures Procedures  Medications Ordered in ED Medications  LORazepam (ATIVAN) tablet 1 mg (has no administration in time range)  meclizine (ANTIVERT) tablet 25 mg (25 mg Oral Given 11/28/17 5427)     Initial Impression / Assessment and Plan / ED Course  I have reviewed the triage vital signs and the nursing notes.  Pertinent labs & imaging results that were available during my care of the patient were reviewed by me and considered in my medical decision making (see chart for details).  Dizziness which she appears clinically to be peripheral vertigo.  No red flags to suspect more serious condition such as stroke or tumor.  Will check screening labs and give therapeutic trial of meclizine.  Old records are reviewed, and she had a ED visit with similar complaints last November, but was felt to be due to starting buspirone.  7:46 AM Patient states no change with meclizine.  Labs are still pending.  We will give dose of lorazepam.  Case is signed out to Dr. Sabra Heck to evaluate labs and response to lorazepam.  Final Clinical Impressions(s) / ED Diagnoses   Final diagnoses:  Dizziness    ED Discharge Orders    None        Delora Fuel, MD 02/28/75 [phone]

## 2017-11-28 NOTE — BH Assessment (Signed)
Tele Assessment Note   Patient Name: Shari Collins MRN: [id number] Referring Physician: EDP Location of Patient: [facility] Location of Provider: [place] is a 42 y.o. female who presented to [facility] with complaint of depressive symptoms and auditory/visual hallucination.  She is voluntary.  Per report, Pt also complained of numbness in fingers.    Pt reported that she has a history of Bipolar Disorder, Depression, and Schizophrenia.  Pt endorsed the following symptoms:  Persistent and unremitting despondency; anxiety with panic attacks; insomnia; guilt and fatigue; auditory and visual hallucination.  Pt denied current suicidal ideation, homicidal ideation, and self-injurious behavior.  Pt endorsed weekly use of alcohol.  She stated that over the weekend, she will consume up to a 5th of liquor per day.  Pt reported last use was Friday, March 22.  Pt lives alone.  She works full-time at [employer].  Pt denied any current psychiatric or outpatient therapy services.  Pt reported that she attempted suicide about two years ago because ''I was really sad.''  When asked if she would be safe if she went home, she stated that she would kill herself.  During assessment, Pt presented as alert and oriented.  She had fair eye contact and was cooperative.  Pt was dressed in scrubs and appeared appropriately groomed.  Pt's mood was depressed.  Affect was mood-congruent.  Pt endorsed despondency, insomnia, hallucination (hearing voices, seeing ghosts), and other symptoms.  Pt's speech was normal in rate, rhythm, and volume.  Thought processes were within normal range, and thought content was logical.  There was no evidence of delusion.  Pt's memory and concentration were intact.  Insight, judgment, and impulse control were fair.  Consulted with Dayle Points, NP who determined that she meets inpatient criteria.  Diagnosis: F313.9 Bipolar Disorder Depressed  Past Medical History:   Past Medical History:  Diagnosis Date  . Asthma   . Depression   . Diabetes mellitus   . Hypertension   . Noncompliance     Past Surgical History:  Procedure Laterality Date  . CESAREAN SECTION    . TONSILLECTOMY      Family History:  Family History  Problem Relation Age of Onset  . Hypertension Paternal Grandmother   . Hypertension Maternal Grandmother   . Diabetes Father   . Hypertension Father   . Cancer Mother        liver  . Heart attack Paternal Grandfather   . Diabetes Brother     Social History:  reports that she has been smoking cigarettes.  She has a 10.00 pack-year smoking history. She has never used smokeless tobacco. She reports that she drinks alcohol. She reports that she does not use drugs.  Additional Social History:  Alcohol / Drug Use Pain Medications: See MAR Prescriptions: See MAR Over the Counter: See MAr History of alcohol / drug use?: Yes Substance #1 Name of Substance 1: Alcohol 1 - Amount (size/oz): almsot a fifth 1 - Frequency: weekly 1 - Duration: Ongoing 1 - Last Use / Amount: 11/26/17  CIWA: CIWA-Ar BP: (!) 154/87 Pulse Rate: 61 COWS:    Allergies:  Allergies  Allergen Reactions  . Bee Venom Anaphylaxis and Swelling  . Buspar [Buspirone] Itching and Anxiety    Dizziness, burning sensation, pupils dilated  . Latex Itching  . Reglan [Metoclopramide]     Itching, hives  . Sulfa Antibiotics Itching and Rash    Home Medications:  (Not in a hospital admission)  OB/GYN Status:  Patient's last menstrual period was 11/16/2017.  General Assessment Data Location of Assessment: [facility] TTS Assessment: In system Is this a Tele or Face-to-Face Assessment?: Tele Assessment Is this an Initial Assessment or a Re-assessment for this encounter?: Initial Assessment Marital status: Single Is patient pregnant?: No Pregnancy Status: No Living Arrangements: Alone Can pt return to current living arrangement?: Yes Admission Status:  Voluntary Is patient capable of signing voluntary admission?: Yes Referral Source: Self/Family/Friend Insurance type: Self-pay     Crisis Care Plan Living Arrangements: Alone Name of Psychiatrist: None currenlty Name of Therapist: None currently  Education Status Is patient currently in school?: No Is the patient employed, unemployed or receiving disability?: Employed  Risk to self with the past 6 months Suicidal Ideation: No Has patient been a risk to self within the past 6 months prior to admission? : No Suicidal Intent: No Has patient had any suicidal intent within the past 6 months prior to admission? : No Is patient at risk for suicide?: No Suicidal Plan?: No Has patient had any suicidal plan within the past 6 months prior to admission? : No Access to Means: No What has been your use of drugs/alcohol within the last 12 months?: Alcohol Previous Attempts/Gestures: Yes How many times?: 1 Triggers for Past Attempts: Other (Comment)(''I was feeling sad'') Intentional Self Injurious Behavior: None Family Suicide History: No Recent stressful life event(s): Other (Comment), Recent negative physical changes(Non-compliant with medication) Persecutory voices/beliefs?: No Depression: Yes Depression Symptoms: Despondent, Insomnia, Isolating, Fatigue, Guilt, Feeling worthless/self pity Substance abuse history and/or treatment for substance abuse?: No Suicide prevention information given to non-admitted patients: Not applicable  Risk to Others within the past 6 months Homicidal Ideation: No Does patient have any lifetime risk of violence toward others beyond the six months prior to admission? : No Thoughts of Harm to Others: No Current Homicidal Intent: No Current Homicidal Plan: No Access to Homicidal Means: No History of harm to others?: No Assessment of Violence: None Noted Does patient have access to weapons?: No Criminal Charges Pending?: No Does patient have a court  date: No Is patient on probation?: No  Psychosis Hallucinations: Auditory, Visual Delusions: None noted  Mental Status Report Appearance/Hygiene: Unremarkable, In scrubs Eye Contact: Fair Motor Activity: Unremarkable Level of Consciousness: Alert Mood: Depressed Affect: Appropriate to circumstance Anxiety Level: None Thought Processes: Coherent, Relevant Judgement: Partial Orientation: Person, Time, Situation, Place Obsessive Compulsive Thoughts/Behaviors: None  Cognitive Functioning Concentration: Normal Memory: Recent Intact, Remote Intact Is patient IDD: No Is patient DD?: No Insight: Fair Impulse Control: Fair Appetite: Good Have you had any weight changes? : No Change Sleep: Decreased Total Hours of Sleep: 4 Vegetative Symptoms: None  ADLScreening [facility] Assessment Services) Patient's cognitive ability adequate to safely complete daily activities?: Yes Patient able to express need for assistance with ADLs?: Yes Independently performs ADLs?: Yes (appropriate for developmental age)  Prior Inpatient Therapy Prior Inpatient Therapy: No  Prior Outpatient Therapy Prior Outpatient Therapy: No Does patient have an ACCT team?: No Does patient have Intensive In-House Services?  : No Does patient have [facility] services? : No Does patient have P4CC services?: No  ADL Screening (condition at time of admission) Patient's cognitive ability adequate to safely complete daily activities?: Yes Is the patient deaf or have difficulty hearing?: No Does the patient have difficulty seeing, even when wearing glasses/contacts?: No Does the patient have difficulty concentrating, remembering, or making decisions?: No Patient able to express need for assistance with ADLs?: Yes Does the  patient have difficulty dressing or bathing?: No Independently performs ADLs?: Yes (appropriate for developmental age) Does the patient have difficulty walking or climbing stairs?: No Weakness of Legs:  None Weakness of Arms/Hands: None  Home Assistive Devices/Equipment Home Assistive Devices/Equipment: None  Therapy Consults (therapy consults require a physician order) PT Evaluation Needed: No OT Evalulation Needed: No SLP Evaluation Needed: No Abuse/Neglect Assessment (Assessment to be complete while patient is alone) Abuse/Neglect Assessment Can Be Completed: Yes Physical Abuse: Denies Verbal Abuse: Denies Sexual Abuse: Denies Exploitation of patient/patient's resources: Denies Self-Neglect: Denies Values / Beliefs Cultural Requests During Hospitalization: None Spiritual Requests During Hospitalization: None Consults Spiritual Care Consult Needed: No Social Work Consult Needed: No Regulatory affairs officer (For Healthcare) Does Patient Have a Medical Advance Directive?: No Would patient like information on creating a medical advance directive?: No - Patient declined    Additional Information 1:1 In Past 12 Months?: No CIRT Risk: No Elopement Risk: No Does patient have medical clearance?: Yes     Disposition:  Disposition Initial Assessment Completed for this Encounter: Yes Disposition of Patient: Admit Type of inpatient treatment program: Adult(Per Dayle Points, NP, Pt meets inpt criteria)  This service was provided via telemedicine using a 2-way, interactive audio and video technology.  Names of all persons participating in this telemedicine service and their role in this encounter. Name:Ajwa Penaflor Role: Patient             Marlowe Aschoff 11/28/2017 9:51 AM

## 2017-11-28 NOTE — ED Notes (Signed)
Urine in lab was not labeled so it has to be recollected per lab. Will notify nurse

## 2017-11-28 NOTE — Progress Notes (Signed)
Patient meets criteria for inpatient treatment. There are currently no appropriate beds available at [facility] per [place], [facility]. CSW faxed referrals to the following facilities for review.  [place], [facility] [place], [place], [place], [place], [place], [place], [place], [place], [place], [place], [place], [place], and [place].   TTS will continue to seek bed placement.     Herbert Moors, MSW, LCSW-A, LCAS-A 11/28/2017 11:42 AM

## 2017-11-28 NOTE — ED Provider Notes (Signed)
The pt presents with dizziness that started yesterday when she awoke, times the dizziness as feeling like the room is moving from the right to the left feeling like she is "drunk".  She does report that she drinks fairly heavily on Saturdays but which is her day off but does not drink on any other days of the week.  She has a history of some mental health problems including depression, schizophrenia and bipolar disorder.  She reports that she has not been taking her depression and anxiety medication (BuSpar) because it gave her an allergic reaction and she did not go back to the therapist.  She reports this was approximately 6 months ago.  Since that time she does note intermittent panic attacks which she describes as shaking, feeling like her fingertips go numb, increased tearfulness and anxiety.  She works full-time at [employer], she reports that her symptoms have been constant since yesterday.  She has been tearful tremulous and feeling numb in her fingertips bilaterally.  She denies any focal weakness or numbness and has not had any difficulty with vision or speech.  Review of the medical record shows that in December the patient was seen for the exact same symptoms and felt like she was allergic to her new medication BuSpar, I saw her at that time and she was complaining of a feeling of being drunk, a feeling of dizziness, dry mouth, shaking, all symptoms that she complains of today as well.  On my exam the patient does not fact have a soft abdomen, clear heart and lung sounds, normal extraocular movements pupillary exam and gross visual acuity.  She has clear speech and normal coordination in all 4 extremities by finger-nose-finger and heel shin.  Normal strength bilaterally with grips and ability to straight leg raise and there is no pronator drift.  Her initial lab work was unremarkable, this patient was signed out to me at the change of shift  We will add a CT scan of the brain as the patient is  significantly concerned that there may be something wrong with her brain.  At this time I cannot induce any vertigo, she does not have any nystagmus, she is ambulated to the bathroom with minimal difficulty.  She denies wanting to hurt herself or having any thoughts of suicide, she does endorse having mild chronic depression and reports that this is in part related to the fact that she is alone, she lost a child a couple of years ago prior to it being born and she feels responsible for that stating that she waited too long to go to the doctor to have the birth.  She does not appear to be responding to internal stimuli.  Psychiatric evaluation has been performed and they recommend inpatient evaluation as the patient does have considerations for suicide if she was discharged.  She does have a prior attempt.   Noemi Chapel, MD 11/28/17 1020

## 2017-11-28 NOTE — ED Notes (Signed)
Given supper tray  

## 2017-11-28 NOTE — ED Triage Notes (Signed)
Pt c/ dizziness since yesterday. Pt also thinks she is having a panic attack. States she has a dry mouth as well.

## 2017-11-28 NOTE — ED Notes (Signed)
Spoke with pt regarding recommendation for inpatient treatment and pt states she cannot stay and must go home to work.  Called [facility] to speak with them regarding recommendations.

## 2017-11-28 NOTE — ED Notes (Signed)
Spoke with Gene from [facility] who states pt told him she would harm herself if she went home.  Pt denies making this statement and says she told him she just wanted to feel better.  Very upset about missing work but is willing to stay voluntarily after process explained to her.  Neighbor is going to take a note to her job.

## 2017-11-29 DIAGNOSIS — F410 Panic disorder [episodic paroxysmal anxiety] without agoraphobia: Secondary | ICD-10-CM | POA: Diagnosis not present

## 2017-11-29 DIAGNOSIS — F3132 Bipolar disorder, current episode depressed, moderate: Secondary | ICD-10-CM | POA: Diagnosis not present

## 2017-11-29 DIAGNOSIS — F17210 Nicotine dependence, cigarettes, uncomplicated: Secondary | ICD-10-CM

## 2017-11-29 LAB — CBG MONITORING, ED
Glucose-Capillary: 158 mg/dL — ABNORMAL HIGH (ref 65–99)
Glucose-Capillary: 155 mg/dL — ABNORMAL HIGH (ref 65–99)

## 2017-11-29 MED ORDER — HYDROXYZINE HCL 25 MG PO TABS: 25.0000 mg | ORAL_TABLET | Freq: Three times a day (TID) | ORAL | 0 refills | Status: DC | PRN

## 2017-11-29 NOTE — ED Notes (Signed)
Gave patient belongings to patient.

## 2017-11-29 NOTE — ED Notes (Signed)
Pt given meal tray.

## 2017-11-29 NOTE — ED Notes (Signed)
Patient crying in room. States "I don't want to go anywhere. If I go, I will lose my job and lose my home. I feel like I'm being set up. I don't remember saying anything like 'I would hurt myself' yesterday. I just remember saying 'I want to get better.' I lost my child 3 years ago and if I was going to hurt myself I would have done it then." Gave patient hydroxyzine for anxiety. Advised Dr Dayna Barker. Verbal order for reassessment from TTS given from EDP.

## 2017-11-29 NOTE — ED Notes (Signed)
Received call from Romie Minus from [facility] stating patient was reassessed and could be cleared psychiatrically. Patient could be discharged home. Advised Dr Dayna Barker.

## 2017-11-29 NOTE — Progress Notes (Signed)
CSW contacted [facility] ED RN,Lorrie S. To advise that pt is psychiatrically cleared and we are recommending discharge.  Shari Collins. Judi Cong, MSW, [place] Disposition Clinical Social Work [phone] (cell) [phone] (office)

## 2017-11-29 NOTE — ED Notes (Signed)
Patient received telepsych for reassessment.

## 2017-11-29 NOTE — Consult Note (Signed)
Telepsych Consultation   Reason for Consult:  Anxiety attack Referring Physician: EDP Location of Patient: [facility] Location of Provider: [facility]  Patient Identification: Shari Collins MRN:  [id number] Principal Diagnosis: Bipolar disorder, unspecified ([place]) Diagnosis:   Patient Active Problem List   Diagnosis Date Noted  . Hyperlipidemia [E78.5] 10/07/2015  . Breast abscess [N61.1] 10/02/2015  . Schizophrenia ([place]) [F20.9] 10/02/2015  . Bipolar disorder, unspecified ([place]) [F31.9] 10/02/2015  . Uncontrolled type 2 diabetes mellitus with complication ([place]) [Q65.7, E11.65] 09/24/2015  . Personal history of noncompliance with medical treatment, presenting hazards to health [Z91.19] 09/24/2015    Total Time spent with patient: 20 minutes  Subjective:   Shari Collins is a 42 y.o. female patient admitted with symptoms of a panic attack.   HPI:    Per initial Tele Assessment note on 11/28/2017 by Krystal Eaton, Counselor:   Shari Collins is a 42 y.o. female who presented to [facility] with complaint of depressive symptoms and auditory/visual hallucination.  She is voluntary.  Per report, Pt also complained of numbness in fingers.    Pt reported that she has a history of Bipolar Disorder, Depression, and Schizophrenia.  Pt endorsed the following symptoms:  Persistent and unremitting despondency; anxiety with panic attacks; insomnia; guilt and fatigue; auditory and visual hallucination.  Pt denied current suicidal ideation, homicidal ideation, and self-injurious behavior.  Pt endorsed weekly use of alcohol.  She stated that over the weekend, she will consume up to a 5th of liquor per day.  Pt reported last use was Friday, March 22.  Pt lives alone.  She works full-time at [employer].  Pt denied any current psychiatric or outpatient therapy services.  Pt reported that she attempted suicide about two years ago because ''I was really sad.''  When asked if she  would be safe if she went home, she stated that she would kill herself.   Per psychiatric assessment on 11/29/2017:   During assessment patient was anxious to be discharged home in order to return to work. She consistently denied ever making any suicidal comments during previous assessment. Patient stated "I just came here to figure out what was going on. They said it was a panic attack. I got scared because my fingertips got numb. I did not even drink last Saturday. I have no shakes or sweats. I need to go home. This is not helping me. I can go back to [facility] in [place]." Patient denied suicidal ideation during the assessment several times. She appeared to be very future oriented. There was no evidence of psychotic process during the assessment. Patient at this time does not meet criteria for inpatient admission. Discussed case with TTS team. Patient appeared stable to discharge to resume outpatient care with [facility].   Past Psychiatric History: Bipolar Disorder  Risk to Self: Suicidal Ideation: No Suicidal Intent: No Is patient at risk for suicide?: No Suicidal Plan?: No Access to Means: No What has been your use of drugs/alcohol within the last 12 months?: Alcohol How many times?: 1 Triggers for Past Attempts: Other (Comment)(''I was feeling sad'') Intentional Self Injurious Behavior: None Risk to Others: Homicidal Ideation: No Thoughts of Harm to Others: No Current Homicidal Intent: No Current Homicidal Plan: No Access to Homicidal Means: No History of harm to others?: No Assessment of Violence: None Noted Does patient have access to weapons?: No Criminal Charges Pending?: No Does patient have a court date: No Prior Inpatient Therapy:  Prior Inpatient Therapy: No Prior Outpatient Therapy: Prior Outpatient Therapy: No Does patient have an ACCT team?: No Does patient have Intensive In-House Services?  : No Does patient have [facility] services? : No Does  patient have P4CC services?: No  Past Medical History:  Past Medical History:  Diagnosis Date  . Asthma   . Depression   . Diabetes mellitus   . Hypertension   . Noncompliance     Past Surgical History:  Procedure Laterality Date  . CESAREAN SECTION    . TONSILLECTOMY     Family History:  Family History  Problem Relation Age of Onset  . Hypertension Paternal Grandmother   . Hypertension Maternal Grandmother   . Diabetes Father   . Hypertension Father   . Cancer Mother        liver  . Heart attack Paternal Grandfather   . Diabetes Brother    Family Psychiatric  History: Denies Social History:  Social History   Substance and Sexual Activity  Alcohol Use Yes   Comment: Weekend -- almost a 5th     Social History   Substance and Sexual Activity  Drug Use No    Social History   Socioeconomic History  . Marital status: Single    Spouse name: Not on file  . Number of children: Not on file  . Years of education: Not on file  . Highest education level: Not on file  Occupational History  . Not on file  Social Needs  . Financial resource strain: Not on file  . Food insecurity:    Worry: Not on file    Inability: Not on file  . Transportation needs:    Medical: Not on file    Non-medical: Not on file  Tobacco Use  . Smoking status: Current Every Day Smoker    Packs/day: 0.50    Years: 20.00    Pack years: 10.00    Types: Cigarettes  . Smokeless tobacco: Never Used  Substance and Sexual Activity  . Alcohol use: Yes    Comment: Weekend -- almost a 5th  . Drug use: No  . Sexual activity: Yes    Birth control/protection: None  Lifestyle  . Physical activity:    Days per week: Not on file    Minutes per session: Not on file  . Stress: Not on file  Relationships  . Social connections:    Talks on phone: Not on file    Gets together: Not on file    Attends religious service: Not on file    Active member of club or organization: Not on file    Attends  meetings of clubs or organizations: Not on file    Relationship status: Not on file  Other Topics Concern  . Not on file  Social History Narrative  . Not on file   Additional Social History:    Allergies:   Allergies  Allergen Reactions  . Bee Venom Anaphylaxis and Swelling  . Buspar [Buspirone] Itching and Anxiety    Dizziness, burning sensation, pupils dilated  . Latex Itching  . Reglan [Metoclopramide]     Itching, hives  . Sulfa Antibiotics Itching and Rash    Labs:  Results for orders placed or performed during the hospital encounter of 11/28/17 (from the past 48 hour(s))  CBC with Differential     Status: Abnormal   Collection Time: 11/28/17  6:45 AM  Result Value Ref Range   WBC 7.7 4.0 - 10.5 K/uL   RBC  4.95 3.87 - 5.11 MIL/uL   Hemoglobin 15.6 (H) 12.0 - 15.0 g/dL   HCT 45.6 36.0 - 46.0 %   MCV 92.1 78.0 - 100.0 fL   MCH 31.5 26.0 - 34.0 pg   MCHC 34.2 30.0 - 36.0 g/dL   RDW 14.2 11.5 - 15.5 %   Platelets 281 150 - 400 K/uL   Neutrophils Relative % 75 %   Neutro Abs 5.8 1.7 - 7.7 K/uL   Lymphocytes Relative 15 %   Lymphs Abs 1.2 0.7 - 4.0 K/uL   Monocytes Relative 4 %   Monocytes Absolute 0.3 0.1 - 1.0 K/uL   Eosinophils Relative 5 %   Eosinophils Absolute 0.4 0.0 - 0.7 K/uL   Basophils Relative 1 %   Basophils Absolute 0.1 0.0 - 0.1 K/uL    Comment: Performed at [facility], [address], [place], [place] [address]  Basic metabolic panel     Status: Abnormal   Collection Time: 11/28/17  6:45 AM  Result Value Ref Range   Sodium 137 135 - 145 mmol/L   Potassium 3.6 3.5 - 5.1 mmol/L   Chloride 103 101 - 111 mmol/L   CO2 24 22 - 32 mmol/L   Glucose, Bld 241 (H) 65 - 99 mg/dL   BUN 10 6 - 20 mg/dL   Creatinine, Ser 0.63 0.44 - 1.00 mg/dL   Calcium 8.7 (L) 8.9 - 10.3 mg/dL   GFR calc non Af Amer >60 >60 mL/min   GFR calc Af Amer >60 >60 mL/min    Comment: (NOTE) The eGFR has been calculated using the CKD EPI equation. This calculation has not been  validated in all clinical situations. eGFR's persistently <60 mL/min signify possible Chronic Kidney Disease.    Anion gap 10 5 - 15    Comment: Performed at [facility], [address] [place], [place] [address]  I-Stat beta hCG blood, ED     Status: None   Collection Time: 11/28/17  7:08 AM  Result Value Ref Range   I-stat hCG, quantitative <5.0 <5 mIU/mL   Comment 3            Comment:   GEST. AGE      CONC.  (mIU/mL)   <=1 WEEK        5 - 50     2 WEEKS       50 - 500     3 WEEKS       100 - 10,000     4 WEEKS     1,000 - 30,000        FEMALE AND NON-PREGNANT FEMALE:     LESS THAN 5 mIU/mL   Ethanol     Status: None   Collection Time: 11/28/17  7:41 AM  Result Value Ref Range   Alcohol, Ethyl (B) <10 <10 mg/dL    Comment:        LOWEST DETECTABLE LIMIT FOR SERUM ALCOHOL IS 10 mg/dL FOR MEDICAL PURPOSES ONLY Performed at [facility], [address], [place], [place] [address]   Rapid urine drug screen (hospital performed)     Status: Abnormal   Collection Time: 11/28/17  5:40 PM  Result Value Ref Range   Opiates NONE DETECTED NONE DETECTED   Cocaine NONE DETECTED NONE DETECTED   Benzodiazepines POSITIVE (A) NONE DETECTED   Amphetamines NONE DETECTED NONE DETECTED   Tetrahydrocannabinol NONE DETECTED NONE DETECTED   Barbiturates NONE DETECTED NONE DETECTED    Comment: (NOTE) DRUG SCREEN  FOR MEDICAL PURPOSES ONLY.  IF CONFIRMATION IS NEEDED FOR ANY PURPOSE, NOTIFY LAB WITHIN 5 DAYS. LOWEST DETECTABLE LIMITS FOR URINE DRUG SCREEN Drug Class                     Cutoff (ng/mL) Amphetamine and metabolites    1000 Barbiturate and metabolites    200 Benzodiazepine                 202 Tricyclics and metabolites     300 Opiates and metabolites        300 Cocaine and metabolites        300 THC                            50 Performed at [facility], [address], [place], [place] [address]   CBG monitoring, ED     Status: Abnormal   Collection Time: 11/28/17  6:18 PM   Result Value Ref Range   Glucose-Capillary 183 (H) 65 - 99 mg/dL  CBG monitoring, ED     Status: Abnormal   Collection Time: 11/28/17  9:10 PM  Result Value Ref Range   Glucose-Capillary 219 (H) 65 - 99 mg/dL  POC CBG, ED     Status: Abnormal   Collection Time: 11/29/17  7:59 AM  Result Value Ref Range   Glucose-Capillary 158 (H) 65 - 99 mg/dL    Medications:  Current Facility-Administered Medications  Medication Dose Route Frequency Provider Last Rate Last Dose  . albuterol (PROVENTIL HFA;VENTOLIN HFA) 108 (90 Base) MCG/ACT inhaler 2 puff  2 puff Inhalation Q6H PRN Francine Graven, DO      . hydrOXYzine (ATARAX/VISTARIL) tablet 25 mg  25 mg Oral Q6H PRN Francine Graven, DO   25 mg at 11/29/17 0856  . insulin glargine (LANTUS) injection 10 Units  10 Units Subcutaneous QHS Francine Graven, DO   10 Units at 11/28/17 2203  . lisinopril (PRINIVIL,ZESTRIL) tablet 10 mg  10 mg Oral Daily Francine Graven, DO   10 mg at 11/29/17 1052  . metFORMIN (GLUCOPHAGE) tablet 1,000 mg  1,000 mg Oral BID WC Francine Graven, DO   1,000 mg at 11/29/17 0856  . nicotine (NICODERM CQ - dosed in mg/24 hours) patch 21 mg  21 mg Transdermal Once Noemi Chapel, MD   21 mg at 11/28/17 1140   Current Outpatient Medications  Medication Sig Dispense Refill  . albuterol (PROVENTIL HFA;VENTOLIN HFA) 108 (90 Base) MCG/ACT inhaler Inhale 2 puffs into the lungs every 6 (six) hours as needed for wheezing or shortness of breath. 1 Inhaler 2  . ibuprofen (ADVIL,MOTRIN) 200 MG tablet Take 800 mg as needed by mouth.    . Insulin Glargine (LANTUS SOLOSTAR) 100 UNIT/ML Solostar Pen Inject 10 Units into the skin at bedtime. 1 pen 0  . lisinopril (PRINIVIL,ZESTRIL) 10 MG tablet Take 1 tablet (10 mg total) by mouth daily. 30 tablet 3  . metFORMIN (GLUCOPHAGE) 1000 MG tablet Take 1 tablet (1,000 mg total) by mouth 2 (two) times daily with a meal. 60 tablet 4  . Omega-3 Fatty Acids (FISH OIL) 1000 MG CAPS Take 1 capsule  (1,000 mg total) by mouth 3 (three) times daily.  0    Musculoskeletal:  Unable to assess via camera   Psychiatric Specialty Exam: Physical Exam  Review of Systems  Psychiatric/Behavioral: Negative for depression, hallucinations, memory loss, substance abuse and suicidal ideas. The patient is not nervous/anxious and does  not have insomnia.     Blood pressure 117/69, pulse 70, temperature 98.1 F (36.7 C), temperature source Oral, resp. rate 18, height '5\' 6"'  (1.676 m), weight 90.7 kg (200 lb), last menstrual period 11/16/2017, SpO2 100 %.Body mass index is 32.28 kg/m.  General Appearance: Casual  Eye Contact:  Good  Speech:  Clear and Coherent  Volume:  Normal  Mood:  Anxious  Affect:  Appropriate  Thought Process:  Coherent and Goal Directed  Orientation:  Full (Time, Place, and Person)  Thought Content:  WDL  Suicidal Thoughts:  No  Homicidal Thoughts:  No  Memory:  Immediate;   Good Recent;   Good Remote;   Good  Judgement:  Fair  Insight:  Present  Psychomotor Activity:  Normal  Concentration:  Concentration: Good and Attention Span: Good  Recall:  Good  Fund of Knowledge:  Good  Language:  Good  Akathisia:  No  Handed:  Right  AIMS (if indicated):     Assets:  Communication Skills Desire for Improvement Housing Leisure Time Physical Health Resilience Social Support Talents/Skills  ADL's:  Intact  Cognition:  WNL  Sleep:        Treatment Plan Summary: Plan Discharge home to resume outpatient treatments  Disposition: No evidence of imminent risk to self or others at present.   Patient does not meet criteria for psychiatric inpatient admission. Supportive therapy provided about ongoing stressors. Discussed crisis plan, support from social network, calling 911, coming to the Emergency Department, and calling Suicide Hotline.  This service was provided via telemedicine using a 2-way, interactive audio and video technology.  Names of all persons  participating in this telemedicine service and their role in this encounter. Name: Sheril Hammond  Role: Patient  Name: Elmarie Shiley  Role: PMHNP-C  Name:  Role:   Name:  Role:     Elmarie Shiley, NP 11/29/2017 11:04 AM

## 2017-11-29 NOTE — ED Provider Notes (Signed)
12:54 PM Assumed care from Drs. Rancour, Renne Musca, please see their note for full history, physical and decision making until this point. In brief this is a 42 y.o. year old female who presented to the ED tonight with Dizziness and Panic Attack     I was asked by nursing to reassess the patient as she was no longer suicidal.  I went the room and she was still very tearful and did seem depressed but states that she is not at all suicidal.  She states that she has a plan for life does have forward thinking and plans for the future.  States that she is afraid she is and lose her job in her house if she has stay in the hospital.  I explained that these are acute health issues and we can help her with her job however I think that the patient does seem to be improved from what it sounds like she had been yesterday so I asked TTS to reconsult.  On their reexamination they felt the patient was stable for discharge.  I reassessed the patient one more time and she still denies suicidality seen much more hopeful and she had even earlier in the day.  She has a psychiatrist she seen in the past and she plans to go see them again tomorrow she can try to make an appointment this afternoon.  Also requests a prescription for Vistaril to help with the anxiety and depression until she can see her psychiatrist to get started on some more long-acting mood stabilization medications. I do feel the patient has some risk factors for suicide but at this time, based on repeat examinations and psychiatry reevaluation recommendations, I think it is unlikely that she is going to act upon them. Will return here if symptoms are worsening.   Discharge instructions, including strict return precautions for new or worsening symptoms, given. Patient and/or family verbalized understanding and agreement with the plan as described.   Labs, studies and imaging reviewed by myself and considered in medical decision making if ordered. Imaging  interpreted by radiology.  Labs Reviewed  CBC WITH DIFFERENTIAL/PLATELET - Abnormal; Notable for the following components:      Result Value   Hemoglobin 15.6 (*)    All other components within normal limits  BASIC METABOLIC PANEL - Abnormal; Notable for the following components:   Glucose, Bld 241 (*)    Calcium 8.7 (*)    All other components within normal limits  RAPID URINE DRUG SCREEN, HOSP PERFORMED - Abnormal; Notable for the following components:   Benzodiazepines POSITIVE (*)    All other components within normal limits  CBG MONITORING, ED - Abnormal; Notable for the following components:   Glucose-Capillary 183 (*)    All other components within normal limits  CBG MONITORING, ED - Abnormal; Notable for the following components:   Glucose-Capillary 219 (*)    All other components within normal limits  CBG MONITORING, ED - Abnormal; Notable for the following components:   Glucose-Capillary 158 (*)    All other components within normal limits  ETHANOL  I-STAT BETA HCG BLOOD, ED ([facility], [facility], [facility] ONLY)  CBG MONITORING, ED  CBG MONITORING, ED    CT Head Wo Contrast  Final Result      No follow-ups on file.    [name], MD 11/29/17 1256

## 2017-11-29 NOTE — ED Notes (Signed)
Call from [place] at [facility], states new order can be discontinued. States there is already plan to reassess patient today. Order discontinued. Dr Dayna Barker notified.

## 2017-12-01 ENCOUNTER — Ambulatory Visit: Payer: Self-pay | Admitting: Physician Assistant

## 2017-12-01 ENCOUNTER — Encounter: Payer: Self-pay | Admitting: Physician Assistant

## 2017-12-01 VITALS — BP 120/77 | HR 80 | Temp 98.1°F | Ht 66.0 in | Wt 194.5 lb

## 2017-12-01 DIAGNOSIS — Z794 Long term (current) use of insulin: Principal | ICD-10-CM

## 2017-12-01 DIAGNOSIS — F39 Unspecified mood [affective] disorder: Secondary | ICD-10-CM

## 2017-12-01 DIAGNOSIS — IMO0002 Reserved for concepts with insufficient information to code with codable children: Secondary | ICD-10-CM

## 2017-12-01 DIAGNOSIS — E1165 Type 2 diabetes mellitus with hyperglycemia: Secondary | ICD-10-CM

## 2017-12-01 DIAGNOSIS — E118 Type 2 diabetes mellitus with unspecified complications: Principal | ICD-10-CM

## 2017-12-01 NOTE — Progress Notes (Signed)
BP 120/77 (BP Location: Right Arm, Patient Position: Sitting, Cuff Size: Normal)   Pulse 80   Temp 98.1 F (36.7 C)   Ht 5\' 6"  (1.676 m)   Wt 194 lb 8 oz (88.2 kg)   LMP 11/16/2017   SpO2 99%   BMI 31.39 kg/m    Subjective:    Patient ID: Shari Collins, female    DOB: Jul 20, 1976, 42 y.o.   MRN: [id number]  HPI: Shari Collins is a 42 y.o. female presenting on 12/01/2017 for Diabetes and Hypertension   HPI   Pt went to [facility] yesterday and scheduled appt with [facility] April 16.  [facility])  She has been monitoring her bs- says it runs  150-200.    She says her breast is good (where she had the abscess)  She says she was fuzzy headed, feeling numb and dry mouth.  She says the hydroxyzine has helped.  She didn't realize that panic attacks can cause those symptoms.  She says that was why she went to the ER recently.  She says she is not having any SI or HI.  She went to work this morning and says she is doing well.  She is having anxiety and hopes to get some help with that at [facility].   Relevant past medical, surgical, family and social history reviewed and updated as indicated. Interim medical history since our last visit reviewed. Allergies and medications reviewed and updated.   Current Outpatient Medications:  .  albuterol (PROVENTIL HFA;VENTOLIN HFA) 108 (90 Base) MCG/ACT inhaler, Inhale 2 puffs into the lungs every 6 (six) hours as needed for wheezing or shortness of breath., Disp: 1 Inhaler, Rfl: 2 .  hydrOXYzine (ATARAX/VISTARIL) 25 MG tablet, Take 1 tablet (25 mg total) by mouth every 8 (eight) hours as needed for anxiety., Disp: 12 tablet, Rfl: 0 .  Insulin Glargine (LANTUS SOLOSTAR) 100 UNIT/ML Solostar Pen, Inject 10 Units into the skin at bedtime. (Patient taking differently: Inject 25 Units into the skin at bedtime. ), Disp: 1 pen, Rfl: 0 .  lisinopril (PRINIVIL,ZESTRIL) 10 MG tablet, Take 1 tablet (10 mg total) by mouth daily., Disp: 30 tablet, Rfl: 3 .  metFORMIN  (GLUCOPHAGE) 1000 MG tablet, Take 1 tablet (1,000 mg total) by mouth 2 (two) times daily with a meal., Disp: 60 tablet, Rfl: 4 .  Omega-3 Fatty Acids (FISH OIL) 1000 MG CAPS, Take 1 capsule (1,000 mg total) by mouth 3 (three) times daily. (Patient taking differently: Take 3 capsules by mouth daily. ), Disp: , Rfl: 0 .  sitaGLIPtin (JANUVIA) 100 MG tablet, Take 100 mg by mouth daily., Disp: , Rfl:   Review of Systems  Constitutional: Negative for appetite change, chills, diaphoresis, fatigue, fever and unexpected weight change.  HENT: Negative for congestion, dental problem, drooling, ear pain, facial swelling, hearing loss, mouth sores, sneezing, sore throat, trouble swallowing and voice change.   Eyes: Negative for pain, discharge, redness, itching and visual disturbance.  Respiratory: Negative for cough, choking, shortness of breath and wheezing.   Cardiovascular: Negative for chest pain, palpitations and leg swelling.  Gastrointestinal: Negative for abdominal pain, blood in stool, constipation, diarrhea and vomiting.  Endocrine: Negative for cold intolerance, heat intolerance and polydipsia.  Genitourinary: Negative for decreased urine volume, dysuria and hematuria.  Musculoskeletal: Negative for arthralgias, back pain and gait problem.  Skin: Negative for rash.  Allergic/Immunologic: Negative for environmental allergies.  Neurological: Negative for seizures, syncope, light-headedness and headaches.  Hematological: Negative for adenopathy.  Psychiatric/Behavioral: Negative for agitation, dysphoric mood and suicidal ideas. The patient is not nervous/anxious.     Per HPI unless specifically indicated above     Objective:    BP 120/77 (BP Location: Right Arm, Patient Position: Sitting, Cuff Size: Normal)   Pulse 80   Temp 98.1 F (36.7 C)   Ht 5\' 6"  (1.676 m)   Wt 194 lb 8 oz (88.2 kg)   LMP 11/16/2017   SpO2 99%   BMI 31.39 kg/m   Wt Readings from Last 3 Encounters:  12/01/17  194 lb 8 oz (88.2 kg)  11/28/17 200 lb (90.7 kg)  11/03/17 205 lb (93 kg)    Physical Exam  Constitutional: She is oriented to person, place, and time. She appears well-developed and well-nourished.  HENT:  Head: Normocephalic and atraumatic.  Neck: Neck supple.  Cardiovascular: Normal rate and regular rhythm.  Pulmonary/Chest: Effort normal and breath sounds normal.  Abdominal: Soft. Bowel sounds are normal. She exhibits no mass. There is no hepatosplenomegaly. There is no tenderness.  Musculoskeletal: She exhibits no edema.  Lymphadenopathy:    She has no cervical adenopathy.  Neurological: She is alert and oriented to person, place, and time.  Skin: Skin is warm and dry.  Psychiatric: She has a normal mood and affect. Her behavior is normal.  Vitals reviewed.       Assessment & Plan:    Encounter Diagnoses  Name Primary?  Mar[place] Kitchen Uncontrolled type 2 diabetes mellitus with complication, with long-term current use of insulin ([place]) Yes  . Mood disorder ([facility])     -Increase lantus to 30 u.  moitor fbs.  Call office for fbs < 70 or > 300 -will Refer for diabetic eye exam -pt to follow up with bs log 1 month.  RTO sooner prn

## 2018-01-03 ENCOUNTER — Encounter: Payer: Self-pay | Admitting: Physician Assistant

## 2018-01-03 ENCOUNTER — Ambulatory Visit: Payer: Self-pay | Admitting: Physician Assistant

## 2018-01-03 VITALS — BP 136/88 | HR 67 | Temp 97.9°F | Ht 66.0 in | Wt 200.0 lb

## 2018-01-03 DIAGNOSIS — J449 Chronic obstructive pulmonary disease, unspecified: Secondary | ICD-10-CM

## 2018-01-03 DIAGNOSIS — E1165 Type 2 diabetes mellitus with hyperglycemia: Secondary | ICD-10-CM

## 2018-01-03 DIAGNOSIS — E118 Type 2 diabetes mellitus with unspecified complications: Principal | ICD-10-CM

## 2018-01-03 DIAGNOSIS — F39 Unspecified mood [affective] disorder: Secondary | ICD-10-CM

## 2018-01-03 DIAGNOSIS — Z794 Long term (current) use of insulin: Principal | ICD-10-CM

## 2018-01-03 DIAGNOSIS — I10 Essential (primary) hypertension: Secondary | ICD-10-CM

## 2018-01-03 DIAGNOSIS — F17219 Nicotine dependence, cigarettes, with unspecified nicotine-induced disorders: Secondary | ICD-10-CM

## 2018-01-03 DIAGNOSIS — E785 Hyperlipidemia, unspecified: Secondary | ICD-10-CM

## 2018-01-03 DIAGNOSIS — R21 Rash and other nonspecific skin eruption: Secondary | ICD-10-CM

## 2018-01-03 DIAGNOSIS — IMO0002 Reserved for concepts with insufficient information to code with codable children: Secondary | ICD-10-CM

## 2018-01-03 MED ORDER — TRIAMCINOLONE ACETONIDE 0.1 % EX CREA: 1.0000 "application " | TOPICAL_CREAM | Freq: Two times a day (BID) | CUTANEOUS | 0 refills | Status: DC

## 2018-01-03 NOTE — Progress Notes (Signed)
BP 136/88 (BP Location: Right Arm, Patient Position: Sitting, Cuff Size: Normal)   Pulse 67   Temp 97.9 F (36.6 C)   Ht 5\' 6"  (1.676 m)   Wt 200 lb (90.7 kg)   SpO2 97%   BMI 32.28 kg/m    Subjective:    Patient ID: Shari Collins, female    DOB: Apr 09, 1976, 42 y.o.   MRN: [id number]  HPI: Shari Collins is a 42 y.o. female presenting on 01/03/2018 for Diabetes   HPI    Reviewed bs log.  She has 107, 113, 114.  Most are 150s up to 226.  Pt c/o rash ant neck for 1 1/2- 2 week.  It itches somes, more when she gets hot  Relevant past medical, surgical, family and social history reviewed and updated as indicated. Interim medical history since our last visit reviewed. Allergies and medications reviewed and updated.   Current Outpatient Medications:  .  albuterol (PROVENTIL HFA;VENTOLIN HFA) 108 (90 Base) MCG/ACT inhaler, Inhale 2 puffs into the lungs every 6 (six) hours as needed for wheezing or shortness of breath., Disp: 1 Inhaler, Rfl: 2 .  hydrOXYzine (ATARAX/VISTARIL) 25 MG tablet, Take 1 tablet (25 mg total) by mouth every 8 (eight) hours as needed for anxiety., Disp: 12 tablet, Rfl: 0 .  Insulin Glargine (LANTUS SOLOSTAR) 100 UNIT/ML Solostar Pen, Inject 10 Units into the skin at bedtime. (Patient taking differently: Inject 35 Units into the skin at bedtime. ), Disp: 1 pen, Rfl: 0 .  lisinopril (PRINIVIL,ZESTRIL) 10 MG tablet, Take 1 tablet (10 mg total) by mouth daily., Disp: 30 tablet, Rfl: 3 .  metFORMIN (GLUCOPHAGE) 1000 MG tablet, Take 1 tablet (1,000 mg total) by mouth 2 (two) times daily with a meal., Disp: 60 tablet, Rfl: 4 .  Omega-3 Fatty Acids (FISH OIL) 1000 MG CAPS, Take 1 capsule (1,000 mg total) by mouth 3 (three) times daily. (Patient taking differently: Take 3 capsules by mouth daily. ), Disp: , Rfl: 0 .  sitaGLIPtin (JANUVIA) 100 MG tablet, Take 100 mg by mouth daily., Disp: , Rfl:    Review of Systems  Constitutional: Negative for appetite change,  chills, diaphoresis, fatigue, fever and unexpected weight change.  HENT: Negative for congestion, dental problem, drooling, ear pain, facial swelling, hearing loss, mouth sores, sneezing, sore throat, trouble swallowing and voice change.   Eyes: Negative for pain, discharge, redness, itching and visual disturbance.  Respiratory: Negative for cough, choking, shortness of breath and wheezing.   Cardiovascular: Negative for chest pain, palpitations and leg swelling.  Gastrointestinal: Negative for abdominal pain, blood in stool, constipation, diarrhea and vomiting.  Endocrine: Negative for cold intolerance, heat intolerance and polydipsia.  Genitourinary: Negative for decreased urine volume, dysuria and hematuria.  Musculoskeletal: Negative for arthralgias, back pain and gait problem.  Skin: Positive for rash.  Allergic/Immunologic: Negative for environmental allergies.  Neurological: Negative for seizures, syncope, light-headedness and headaches.  Hematological: Negative for adenopathy.  Psychiatric/Behavioral: Negative for agitation, dysphoric mood and suicidal ideas. The patient is not nervous/anxious.     Per HPI unless specifically indicated above     Objective:    BP 136/88 (BP Location: Right Arm, Patient Position: Sitting, Cuff Size: Normal)   Pulse 67   Temp 97.9 F (36.6 C)   Ht 5\' 6"  (1.676 m)   Wt 200 lb (90.7 kg)   SpO2 97%   BMI 32.28 kg/m   Wt Readings from Last 3 Encounters:  01/03/18 200 lb (90.7 kg)  12/01/17 194 lb 8 oz (88.2 kg)  11/28/17 200 lb (90.7 kg)    Physical Exam  Constitutional: She is oriented to person, place, and time. She appears well-developed and well-nourished.  HENT:  Head: Normocephalic and atraumatic.  Pulmonary/Chest: Effort normal. No respiratory distress.  Neurological: She is alert and oriented to person, place, and time.  Skin: Skin is warm and dry. Rash noted. Rash is papular.  Approximately 2 inch area anterior neck.  Papules. No  redness.   Psychiatric: She has a normal mood and affect. Her behavior is normal.  Nursing note and vitals reviewed.       Assessment & Plan:   Encounter Diagnoses  Name Primary?  Marland Kitchen Uncontrolled type 2 diabetes mellitus with complication, with long-term current use of insulin ([place]) Yes  . Rash   . Essential hypertension   . Hyperlipidemia, unspecified hyperlipidemia type   . Chronic obstructive pulmonary disease, unspecified COPD type ([place])   . Cigarette nicotine dependence with nicotine-induced disorder   . Mood disorder ([place])     -pt to continue 35 units insulin. She is counseled to Watch diabetic diet. She says she knows how to do it, she just doesn't.  Discussed how good control will help her health and that improved diet is what is needed.  Discussed that increasing the insulin would cause her to get lows.  She states understanding.  -rx TAC for rash.  -pt to follow up 1 month with labs before appointment.  RTO sooner prn

## 2018-01-12 ENCOUNTER — Ambulatory Visit: Payer: Self-pay | Admitting: Physician Assistant

## 2018-01-12 ENCOUNTER — Encounter: Payer: Self-pay | Admitting: Physician Assistant

## 2018-01-12 VITALS — BP 112/70 | HR 91 | Temp 97.5°F | Ht 66.0 in | Wt 189.8 lb

## 2018-01-12 DIAGNOSIS — N3090 Cystitis, unspecified without hematuria: Secondary | ICD-10-CM

## 2018-01-12 MED ORDER — CIPROFLOXACIN HCL 500 MG PO TABS: 500.0000 mg | ORAL_TABLET | Freq: Two times a day (BID) | ORAL | 0 refills | Status: AC

## 2018-01-12 NOTE — Progress Notes (Signed)
BP 112/70 (BP Location: Left Arm, Patient Position: Sitting, Cuff Size: Large)   Pulse 91   Temp (!) 97.5 F (36.4 C) (Other (Comment))   Ht 5\' 6"  (1.676 m)   Wt 189 lb 12 oz (86.1 kg)   LMP 01/10/2018 (Exact Date)   SpO2 97%   BMI 30.63 kg/m    Subjective:    Patient ID: Shari Collins, female    DOB: 07-15-1976, 42 y.o.   MRN: [id number]  HPI: Shari Collins is a 42 y.o. female presenting on 01/12/2018 for Urinary Frequency ('and pain at the end and has been taking Azo")   HPI   Chief Complaint  Patient presents with  . Urinary Frequency    'and pain at the end and has been taking Azo"    Pt denies fever.  States urinary pain since Monday.  History of UTI.  Pt is allergic to septra and she likes cipro that she has used in the past  Relevant past medical, surgical, family and social history reviewed and updated as indicated. Interim medical history since our last visit reviewed. Allergies and medications reviewed and updated.  Review of Systems  Constitutional: Negative for appetite change, chills, diaphoresis, fatigue, fever and unexpected weight change.  HENT: Negative for congestion, dental problem, drooling, ear pain, facial swelling, hearing loss, mouth sores, sneezing, sore throat, trouble swallowing and voice change.   Eyes: Negative for pain, discharge, redness, itching and visual disturbance.  Respiratory: Negative for cough, choking, shortness of breath and wheezing.   Cardiovascular: Negative for chest pain, palpitations and leg swelling.  Gastrointestinal: Negative for abdominal pain, blood in stool, constipation, diarrhea and vomiting.  Endocrine: Negative for cold intolerance, heat intolerance and polydipsia.  Genitourinary: Positive for dysuria. Negative for decreased urine volume and hematuria.  Musculoskeletal: Positive for back pain. Negative for arthralgias and gait problem.  Skin: Negative for rash.  Allergic/Immunologic: Negative for environmental  allergies.  Neurological: Negative for seizures, syncope, light-headedness and headaches.  Hematological: Negative for adenopathy.  Psychiatric/Behavioral: Negative for agitation, dysphoric mood and suicidal ideas. The patient is not nervous/anxious.     Per HPI unless specifically indicated above     Objective:    BP 112/70 (BP Location: Left Arm, Patient Position: Sitting, Cuff Size: Large)   Pulse 91   Temp (!) 97.5 F (36.4 C) (Other (Comment))   Ht 5\' 6"  (1.676 m)   Wt 189 lb 12 oz (86.1 kg)   LMP 01/10/2018 (Exact Date)   SpO2 97%   BMI 30.63 kg/m   Wt Readings from Last 3 Encounters:  01/12/18 189 lb 12 oz (86.1 kg)  01/03/18 200 lb (90.7 kg)  12/01/17 194 lb 8 oz (88.2 kg)    Physical Exam  Constitutional: She is oriented to person, place, and time. She appears well-developed and well-nourished.  HENT:  Head: Normocephalic and atraumatic.  Neck: Neck supple.  Cardiovascular: Normal rate and regular rhythm.  Pulmonary/Chest: Effort normal and breath sounds normal.  Abdominal: Soft. Bowel sounds are normal. She exhibits no mass. There is no hepatosplenomegaly. There is no tenderness.  Musculoskeletal: She exhibits no edema.  Lymphadenopathy:    She has no cervical adenopathy.  Neurological: She is alert and oriented to person, place, and time.  Skin: Skin is warm and dry.  Psychiatric: She has a normal mood and affect. Her behavior is normal.  Vitals reviewed.       Assessment & Plan:    Encounter Diagnosis  Name Primary?  Marland Kitchen  Cystitis Yes    -rx cipro -counseled and gave reading information on UTI -follow up as scheduled.  RTO sooenr prn

## 2018-01-12 NOTE — Patient Instructions (Signed)

## 2018-01-27 ENCOUNTER — Ambulatory Visit: Payer: Self-pay | Admitting: Physician Assistant

## 2018-01-27 ENCOUNTER — Encounter: Payer: Self-pay | Admitting: Physician Assistant

## 2018-01-27 VITALS — BP 126/70 | HR 78 | Temp 97.9°F | Ht 66.0 in | Wt 200.0 lb

## 2018-01-27 DIAGNOSIS — H6121 Impacted cerumen, right ear: Secondary | ICD-10-CM

## 2018-01-27 DIAGNOSIS — N898 Other specified noninflammatory disorders of vagina: Secondary | ICD-10-CM

## 2018-01-27 DIAGNOSIS — N760 Acute vaginitis: Secondary | ICD-10-CM

## 2018-01-27 LAB — POCT URINALYSIS DIPSTICK
BILIRUBIN UA: NEGATIVE
Blood, UA: NEGATIVE
Glucose, UA: NEGATIVE
Ketones, UA: NEGATIVE
Leukocytes, UA: NEGATIVE
NITRITE UA: NEGATIVE
Protein, UA: POSITIVE — AB
SPEC GRAV UA: 1.025 (ref 1.010–1.025)
Urobilinogen, UA: 0.2 E.U./dL
pH, UA: 6 (ref 5.0–8.0)

## 2018-01-27 NOTE — Progress Notes (Signed)
BP 126/70   Pulse 78   Temp 97.9 F (36.6 C)   Ht 5\' 6"  (1.676 m)   Wt 200 lb (90.7 kg)   LMP 01/10/2018 (Exact Date)   SpO2 96%   BMI 32.28 kg/m    Subjective:    Patient ID: Shari Collins, female    DOB: 01-06-1976, 42 y.o.   MRN: [id number]  HPI: Shari Collins is a 42 y.o. female presenting on 01/27/2018 for Vaginal Discharge   HPI   Pt took cipro for UTI a few weeks ago.  Her vaginal discharge started after that.  It itches.  No chance of STDs.   She says this is like her previous yeast infections.   Pt states R ear stopped up  Relevant past medical, surgical, family and social history reviewed and updated as indicated. Interim medical history since our last visit reviewed. Allergies and medications reviewed and updated.   Current Outpatient Medications:  .  albuterol (PROVENTIL HFA;VENTOLIN HFA) 108 (90 Base) MCG/ACT inhaler, Inhale 2 puffs into the lungs every 6 (six) hours as needed for wheezing or shortness of breath., Disp: 1 Inhaler, Rfl: 2 .  hydrOXYzine (ATARAX/VISTARIL) 25 MG tablet, Take 1 tablet (25 mg total) by mouth every 8 (eight) hours as needed for anxiety., Disp: 12 tablet, Rfl: 0 .  Insulin Glargine (LANTUS SOLOSTAR) 100 UNIT/ML Solostar Pen, Inject 10 Units into the skin at bedtime. (Patient taking differently: Inject 35 Units into the skin at bedtime. ), Disp: 1 pen, Rfl: 0 .  lisinopril (PRINIVIL,ZESTRIL) 10 MG tablet, Take 1 tablet (10 mg total) by mouth daily., Disp: 30 tablet, Rfl: 3 .  metFORMIN (GLUCOPHAGE) 1000 MG tablet, Take 1 tablet (1,000 mg total) by mouth 2 (two) times daily with a meal., Disp: 60 tablet, Rfl: 4 .  Omega-3 Fatty Acids (FISH OIL) 1000 MG CAPS, Take 1 capsule (1,000 mg total) by mouth 3 (three) times daily. (Patient taking differently: Take 3 capsules by mouth daily. ), Disp: , Rfl: 0 .  sitaGLIPtin (JANUVIA) 100 MG tablet, Take 100 mg by mouth daily., Disp: , Rfl:  .  triamcinolone cream (KENALOG) 0.1 %, Apply 1  application topically 2 (two) times daily., Disp: 30 g, Rfl: 0   Review of Systems  Constitutional: Negative for appetite change, chills, diaphoresis, fatigue, fever and unexpected weight change.  HENT: Negative for congestion, dental problem, drooling, ear pain, facial swelling, hearing loss, mouth sores, sneezing, sore throat, trouble swallowing and voice change.   Eyes: Negative for pain, discharge, redness, itching and visual disturbance.  Respiratory: Negative for cough, choking, shortness of breath and wheezing.   Cardiovascular: Negative for chest pain, palpitations and leg swelling.  Gastrointestinal: Negative for abdominal pain, blood in stool, constipation, diarrhea and vomiting.  Endocrine: Negative for cold intolerance, heat intolerance and polydipsia.  Genitourinary: Negative for decreased urine volume, dysuria and hematuria.  Musculoskeletal: Negative for arthralgias, back pain and gait problem.  Skin: Negative for rash.  Allergic/Immunologic: Negative for environmental allergies.  Neurological: Negative for seizures, syncope, light-headedness and headaches.  Hematological: Negative for adenopathy.  Psychiatric/Behavioral: Negative for agitation, dysphoric mood and suicidal ideas. The patient is not nervous/anxious.     Per HPI unless specifically indicated above     Objective:    BP 126/70   Pulse 78   Temp 97.9 F (36.6 C)   Ht 5\' 6"  (1.676 m)   Wt 200 lb (90.7 kg)   LMP 01/10/2018 (Exact Date)   SpO2 96%  BMI 32.28 kg/m   Wt Readings from Last 3 Encounters:  01/27/18 200 lb (90.7 kg)  01/12/18 189 lb 12 oz (86.1 kg)  01/03/18 200 lb (90.7 kg)    Physical Exam  Constitutional: She appears well-developed and well-nourished.  HENT:  Head: Normocephalic and atraumatic.  Right Ear: Tympanic membrane and external ear normal. A foreign body is present.  Left Ear: Tympanic membrane, external ear and ear canal normal.  Cerumen right ear.  After lavage, cerumen  gone and pt states feels better  Pulmonary/Chest: Effort normal. No respiratory distress.  Nursing note and vitals reviewed.       Assessment & Plan:   Encounter Diagnoses  Name Primary?  . Acute vaginitis Yes  . Itching in the vaginal area   . Impacted cerumen of right ear     -Pt to get fasting labs for Follow up next week for routine appointment -Gave sample clotrimazole 3 night treatment for yeast

## 2018-02-01 ENCOUNTER — Other Ambulatory Visit (HOSPITAL_COMMUNITY)
Admission: RE | Admit: 2018-02-01 | Discharge: 2018-02-01 | Disposition: A | Discharge status: Home / Self Care | Payer: Self-pay | Source: Ambulatory Visit | Attending: Physician Assistant | Admitting: Physician Assistant

## 2018-02-01 DIAGNOSIS — E118 Type 2 diabetes mellitus with unspecified complications: Secondary | ICD-10-CM | POA: Insufficient documentation

## 2018-02-01 DIAGNOSIS — I10 Essential (primary) hypertension: Secondary | ICD-10-CM | POA: Insufficient documentation

## 2018-02-01 DIAGNOSIS — E1165 Type 2 diabetes mellitus with hyperglycemia: Secondary | ICD-10-CM | POA: Insufficient documentation

## 2018-02-01 DIAGNOSIS — Z794 Long term (current) use of insulin: Secondary | ICD-10-CM | POA: Insufficient documentation

## 2018-02-01 DIAGNOSIS — E785 Hyperlipidemia, unspecified: Secondary | ICD-10-CM | POA: Insufficient documentation

## 2018-02-01 DIAGNOSIS — IMO0002 Reserved for concepts with insufficient information to code with codable children: Secondary | ICD-10-CM

## 2018-02-01 LAB — LIPID PANEL
Cholesterol: 244 mg/dL — ABNORMAL HIGH (ref 0–200)
HDL: 40 mg/dL — AB (ref >40)
LDL CALC: UNDETERMINED mg/dL (ref 0–99)
Total CHOL/HDL Ratio: 6.1 RATIO
Triglycerides: 516 mg/dL — ABNORMAL HIGH (ref <150)
VLDL: UNDETERMINED mg/dL (ref 0–40)

## 2018-02-01 LAB — COMPREHENSIVE METABOLIC PANEL
ALT: 11 U/L — ABNORMAL LOW (ref 14–54)
ANION GAP: 8 (ref 5–15)
AST: 9 U/L — AB (ref 15–41)
Albumin: 3.9 g/dL (ref 3.5–5.0)
Alkaline Phosphatase: 51 U/L (ref 38–126)
BILIRUBIN TOTAL: 0.6 mg/dL (ref 0.3–1.2)
BUN: 15 mg/dL (ref 6–20)
CHLORIDE: 102 mmol/L (ref 101–111)
CO2: 24 mmol/L (ref 22–32)
Calcium: 9.5 mg/dL (ref 8.9–10.3)
Creatinine, Ser: 0.66 mg/dL (ref 0.44–1.00)
GFR calc Af Amer: >60 mL/min (ref >60)
GFR calc non Af Amer: >60 mL/min (ref >60)
Glucose, Bld: 152 mg/dL — ABNORMAL HIGH (ref 65–99)
Potassium: 4.4 mmol/L (ref 3.5–5.1)
SODIUM: 134 mmol/L — AB (ref 135–145)
TOTAL PROTEIN: 7.3 g/dL (ref 6.5–8.1)

## 2018-02-01 LAB — HEMOGLOBIN A1C
HEMOGLOBIN A1C: 6.8 % — AB (ref 4.8–5.6)
MEAN PLASMA GLUCOSE: 148.46 mg/dL

## 2018-02-02 ENCOUNTER — Ambulatory Visit: Payer: Self-pay | Admitting: Physician Assistant

## 2018-02-15 ENCOUNTER — Other Ambulatory Visit: Payer: Self-pay | Admitting: Physician Assistant

## 2018-02-15 MED ORDER — FLUCONAZOLE 150 MG PO TABS: 150.0000 mg | ORAL_TABLET | Freq: Once | ORAL | 0 refills | Status: AC

## 2018-02-21 ENCOUNTER — Encounter: Payer: Self-pay | Admitting: Physician Assistant

## 2018-02-22 ENCOUNTER — Encounter: Payer: Self-pay | Admitting: Physician Assistant

## 2018-02-22 ENCOUNTER — Ambulatory Visit: Payer: Self-pay | Admitting: Physician Assistant

## 2018-02-22 VITALS — BP 112/71 | HR 78 | Temp 97.9°F | Ht 66.0 in | Wt 198.5 lb

## 2018-02-22 DIAGNOSIS — E669 Obesity, unspecified: Secondary | ICD-10-CM

## 2018-02-22 DIAGNOSIS — E785 Hyperlipidemia, unspecified: Secondary | ICD-10-CM

## 2018-02-22 DIAGNOSIS — E118 Type 2 diabetes mellitus with unspecified complications: Secondary | ICD-10-CM

## 2018-02-22 DIAGNOSIS — F17219 Nicotine dependence, cigarettes, with unspecified nicotine-induced disorders: Secondary | ICD-10-CM

## 2018-02-22 DIAGNOSIS — I10 Essential (primary) hypertension: Secondary | ICD-10-CM

## 2018-02-22 NOTE — Patient Instructions (Addendum)
[facility] [place], [place] [id number]  [phone] Thursday, April 14, 2018 9am  Cholesterol Cholesterol is a white, waxy, fat-like substance that is needed by the human body in small amounts. The liver makes all the cholesterol we need. Cholesterol is carried from the liver by the blood through the blood vessels. Deposits of cholesterol (plaques) may build up on blood vessel (artery) walls. Plaques make the arteries narrower and stiffer. Cholesterol plaques increase the risk for heart attack and stroke. You cannot feel your cholesterol level even if it is very high. The only way to know that it is high is to have a blood test. Once you know your cholesterol levels, you should keep a record of the test results. Work with your health care provider to keep your levels in the desired range. What do the results mean?  Total cholesterol is a rough measure of all the cholesterol in your blood.  LDL (low-density lipoprotein) is the "bad" cholesterol. This is the type that causes plaque to build up on the artery walls. You want this level to be low.  HDL (high-density lipoprotein) is the "good" cholesterol because it cleans the arteries and carries the LDL away. You want this level to be high.  Triglycerides are fat that the body can either burn for energy or store. High levels are closely linked to heart disease. What are the desired levels of cholesterol?  Total cholesterol below 200.  LDL below 100 for people who are at risk, below 70 for people at very high risk.  HDL above 40 is good. A level of 60 or higher is considered to be protective against heart disease.  Triglycerides below 150. How can I lower my cholesterol? Diet Follow your diet program as told by your health care provider.  Choose fish or white meat chicken and [place], roasted or baked. Limit fatty cuts of red meat, fried foods, and processed meats, such as sausage and lunch meats.  Eat lots of fresh fruits and  vegetables.  Choose whole grains, beans, pasta, potatoes, and cereals.  Choose olive oil, corn oil, or canola oil, and use only small amounts.  Avoid butter, mayonnaise, shortening, or palm kernel oils.  Avoid foods with trans fats.  Drink skim or nonfat milk and eat low-fat or nonfat yogurt and cheeses. Avoid whole milk, cream, ice cream, egg yolks, and full-fat cheeses.  Healthier desserts include angel food cake, ginger snaps, animal crackers, hard candy, popsicles, and low-fat or nonfat frozen yogurt. Avoid pastries, cakes, pies, and cookies.  Exercise  Follow your exercise program as told by your health care provider. A regular program: ? Helps to decrease LDL and raise HDL. ? Helps with weight control.  Do things that increase your activity level, such as gardening, walking, and taking the stairs.  Ask your health care provider about ways that you can be more active in your daily life.  Medicine  Take over-the-counter and prescription medicines only as told by your health care provider. ? Medicine may be prescribed by your health care provider to help lower cholesterol and decrease the risk for heart disease. This is usually done if diet and exercise have failed to bring down cholesterol levels. ? If you have several risk factors, you may need medicine even if your levels are normal.  This information is not intended to replace advice given to you by your health care provider. Make sure you discuss any questions you have with your health care provider. Document  Released: 05/19/2001 Document Revised: 03/21/2016 Document Reviewed: 02/22/2016 Elsevier Interactive Patient Education  [employer].

## 2018-02-22 NOTE — Progress Notes (Signed)
BP 112/71 (BP Location: Right Arm, Patient Position: Sitting, Cuff Size: Normal)   Pulse 78   Temp 97.9 F (36.6 C)   Ht 5\' 6"  (1.676 m)   Wt 198 lb 8 oz (90 kg)   SpO2 98%   BMI 32.04 kg/m    Subjective:    Patient ID: Shari Collins, female    DOB: 1975/12/08, 42 y.o.   MRN: [id number]  HPI: Shari Collins is a 42 y.o. female presenting on 02/22/2018 for Diabetes   HPI   Pt was out of her fish oil for a while.  Currently she is taking 3 daily.   She is feeling well today and has no complaints  Relevant past medical, surgical, family and social history reviewed and updated as indicated. Interim medical history since our last visit reviewed. Allergies and medications reviewed and updated.   Current Outpatient Medications:  .  albuterol (PROVENTIL HFA;VENTOLIN HFA) 108 (90 Base) MCG/ACT inhaler, Inhale 2 puffs into the lungs every 6 (six) hours as needed for wheezing or shortness of breath., Disp: 1 Inhaler, Rfl: 2 .  hydrOXYzine (ATARAX/VISTARIL) 25 MG tablet, Take 1 tablet (25 mg total) by mouth every 8 (eight) hours as needed for anxiety., Disp: 12 tablet, Rfl: 0 .  Insulin Glargine (LANTUS SOLOSTAR) 100 UNIT/ML Solostar Pen, Inject 10 Units into the skin at bedtime. (Patient taking differently: Inject 35 Units into the skin at bedtime. ), Disp: 1 pen, Rfl: 0 .  lisinopril (PRINIVIL,ZESTRIL) 10 MG tablet, Take 1 tablet (10 mg total) by mouth daily., Disp: 30 tablet, Rfl: 3 .  metFORMIN (GLUCOPHAGE) 1000 MG tablet, Take 1 tablet (1,000 mg total) by mouth 2 (two) times daily with a meal., Disp: 60 tablet, Rfl: 4 .  Omega-3 Fatty Acids (FISH OIL) 1000 MG CAPS, Take 1 capsule (1,000 mg total) by mouth 3 (three) times daily. (Patient taking differently: Take 3 capsules by mouth daily. ), Disp: , Rfl: 0 .  sitaGLIPtin (JANUVIA) 100 MG tablet, Take 100 mg by mouth daily., Disp: , Rfl:  .  triamcinolone cream (KENALOG) 0.1 %, Apply 1 application topically 2 (two) times daily.  (Patient taking differently: Apply 1 application topically 2 (two) times daily as needed. ), Disp: 30 g, Rfl: 0  Review of Systems  Constitutional: Negative for appetite change, chills, diaphoresis, fatigue, fever and unexpected weight change.  HENT: Negative for congestion, dental problem, drooling, ear pain, facial swelling, hearing loss, mouth sores, sneezing, sore throat, trouble swallowing and voice change.   Eyes: Negative for pain, discharge, redness, itching and visual disturbance.  Respiratory: Negative for cough, choking, shortness of breath and wheezing.   Cardiovascular: Negative for chest pain, palpitations and leg swelling.  Gastrointestinal: Negative for abdominal pain, blood in stool, constipation, diarrhea and vomiting.  Endocrine: Negative for cold intolerance, heat intolerance and polydipsia.  Genitourinary: Negative for decreased urine volume, dysuria and hematuria.  Musculoskeletal: Negative for arthralgias, back pain and gait problem.  Skin: Negative for rash.  Allergic/Immunologic: Negative for environmental allergies.  Neurological: Negative for seizures, syncope, light-headedness and headaches.  Hematological: Negative for adenopathy.  Psychiatric/Behavioral: Negative for agitation, dysphoric mood and suicidal ideas. The patient is not nervous/anxious.     Per HPI unless specifically indicated above     Objective:    BP 112/71 (BP Location: Right Arm, Patient Position: Sitting, Cuff Size: Normal)   Pulse 78   Temp 97.9 F (36.6 C)   Ht 5\' 6"  (1.676 m)   Wt 198 lb  8 oz (90 kg)   SpO2 98%   BMI 32.04 kg/m   Wt Readings from Last 3 Encounters:  02/22/18 198 lb 8 oz (90 kg)  01/27/18 200 lb (90.7 kg)  01/12/18 189 lb 12 oz (86.1 kg)    Physical Exam  Constitutional: She is oriented to person, place, and time. She appears well-developed and well-nourished.  HENT:  Head: Normocephalic and atraumatic.  Neck: Neck supple.  Cardiovascular: Normal rate and  regular rhythm.  Pulmonary/Chest: Effort normal and breath sounds normal.  Abdominal: Soft. Bowel sounds are normal. She exhibits no mass. There is no hepatosplenomegaly. There is no tenderness.  Musculoskeletal: She exhibits no edema.  Lymphadenopathy:    She has no cervical adenopathy.  Neurological: She is alert and oriented to person, place, and time.  Skin: Skin is warm and dry.  Psychiatric: She has a normal mood and affect. Her behavior is normal.  Vitals reviewed.   Results for orders placed or performed during the hospital encounter of 02/01/18  Comprehensive metabolic panel  Result Value Ref Range   Sodium 134 (L) 135 - 145 mmol/L   Potassium 4.4 3.5 - 5.1 mmol/L   Chloride 102 101 - 111 mmol/L   CO2 24 22 - 32 mmol/L   Glucose, Bld 152 (H) 65 - 99 mg/dL   BUN 15 6 - 20 mg/dL   Creatinine, Ser 0.66 0.44 - 1.00 mg/dL   Calcium 9.5 8.9 - 10.3 mg/dL   Total Protein 7.3 6.5 - 8.1 g/dL   Albumin 3.9 3.5 - 5.0 g/dL   AST 9 (L) 15 - 41 U/L   ALT 11 (L) 14 - 54 U/L   Alkaline Phosphatase 51 38 - 126 U/L   Total Bilirubin 0.6 0.3 - 1.2 mg/dL   GFR calc non Af Amer >60 >60 mL/min   GFR calc Af Amer >60 >60 mL/min   Anion gap 8 5 - 15  Lipid panel  Result Value Ref Range   Cholesterol 244 (H) 0 - 200 mg/dL   Triglycerides 516 (H) <150 mg/dL   HDL 40 (L) >40 mg/dL   Total CHOL/HDL Ratio 6.1 RATIO   VLDL UNABLE TO CALCULATE IF TRIGLYCERIDE OVER 400 mg/dL 0 - 40 mg/dL   LDL Cholesterol UNABLE TO CALCULATE IF TRIGLYCERIDE OVER 400 mg/dL 0 - 99 mg/dL  Hemoglobin A1c  Result Value Ref Range   Hgb A1c MFr Bld 6.8 (H) 4.8 - 5.6 %   Mean Plasma Glucose 148.46 mg/dL      Assessment & Plan:    Encounter Diagnoses  Name Primary?  . Controlled diabetes mellitus type 2 with complications, unspecified whether long term insulin use ([place]) Yes  . Essential hypertension   . Hyperlipidemia, unspecified hyperlipidemia type   . Cigarette nicotine dependence with nicotine-induced  disorder   . Obesity, unspecified classification, unspecified obesity type, unspecified whether serious comorbidity present     -reviewed labs with pt -pt to iincrease fish oil to 4 daily.  -Watch lowfat diet -continue other medications  -pt given information for diabetic eye exam for August 8 -counseled smoking cessation -Follow up 3 months

## 2018-02-28 IMAGING — CT CT ABD-PELV W/ CM
2 of 5 series · 16 of 46 positions shown, 18 images · IV contrast (Isovue)
Comparison: 11/21/2012

CLINICAL DATA: Right lower quadrant pain with diarrhea, nausea, and
fever.

EXAM:
CT ABDOMEN AND PELVIS WITH CONTRAST
TECHNIQUE: Multidetector CT imaging of the abdomen and pelvis was performed
using the standard protocol following bolus administration of
intravenous contrast.
CONTRAST:  100mL MTPYQZ-PJJ IOPAMIDOL (MTPYQZ-PJJ) INJECTION 61%

[Series 2: axial st · axial · 0.74mm/px · z∈[+905,+1335]mm · 13 of 98 slices shown, 15 images]
[im 6/98  soft-tissue]
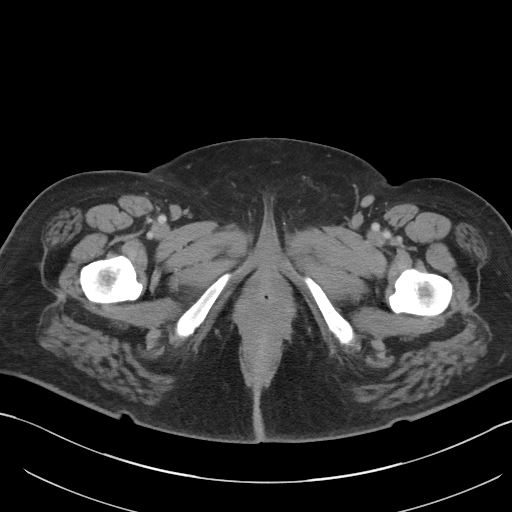
[im 6/98  bone]
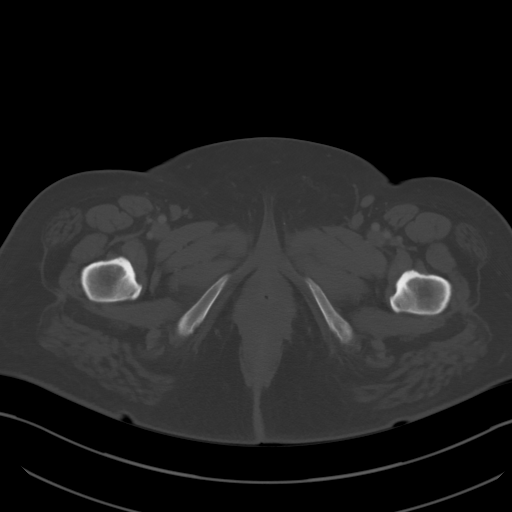
[im 11/98  soft-tissue]
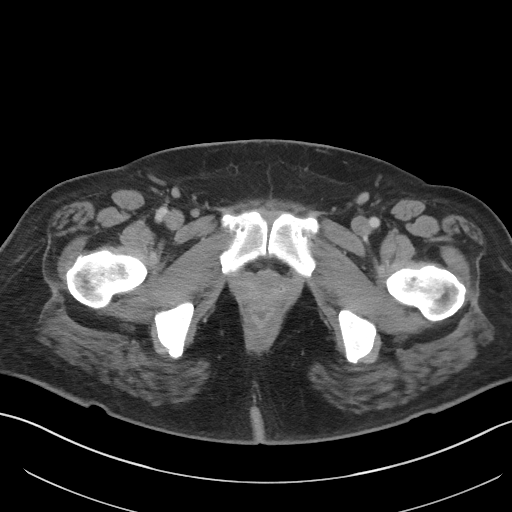
[im 22/98  soft-tissue]
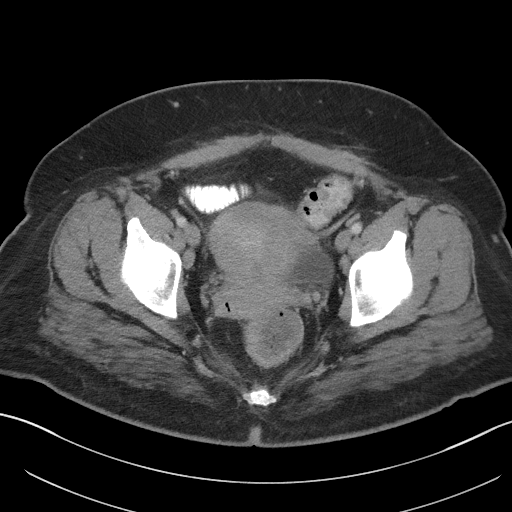
[im 27/98  soft-tissue]
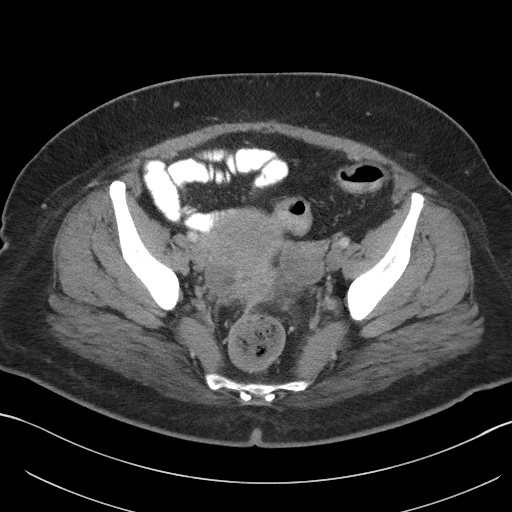
[im 33/98  soft-tissue]
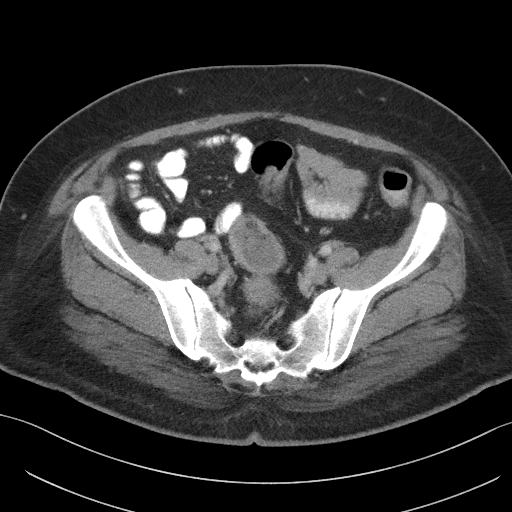
[im 44/98  soft-tissue]
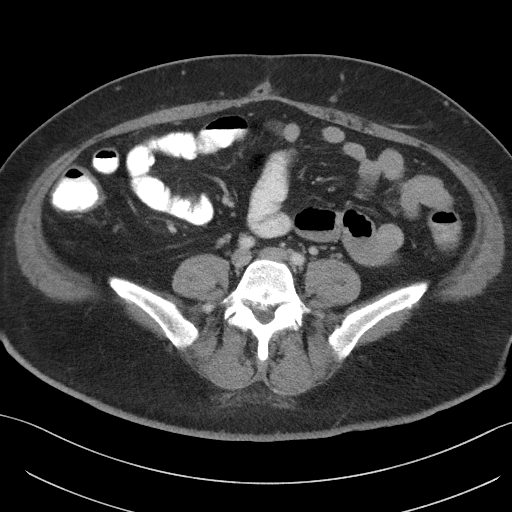
[im 49/98  soft-tissue]
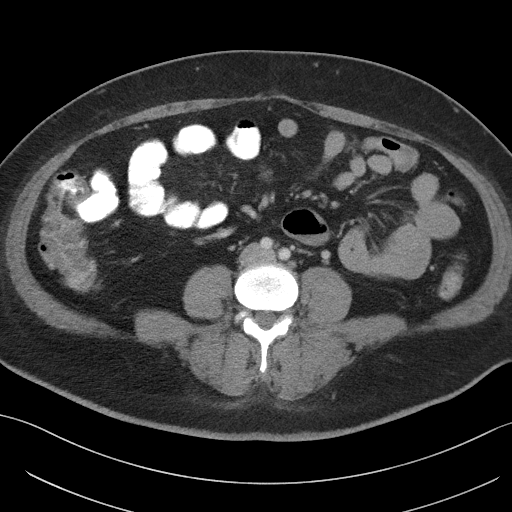
[im 54/98  soft-tissue]
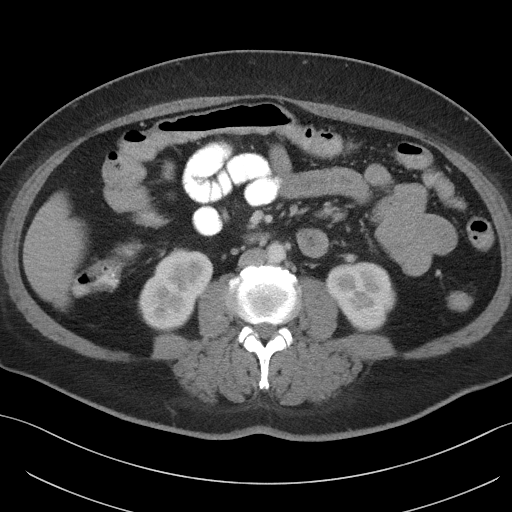
[im 65/98  soft-tissue]
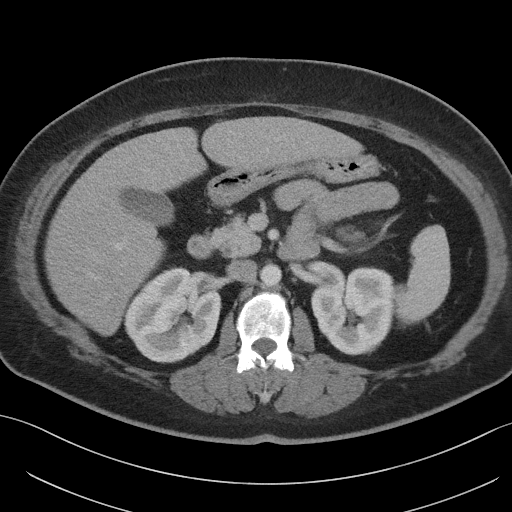
[im 65/98  bone]
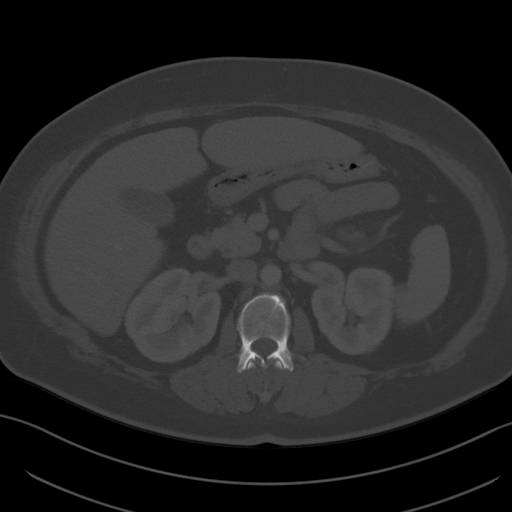
[im 71/98  soft-tissue]
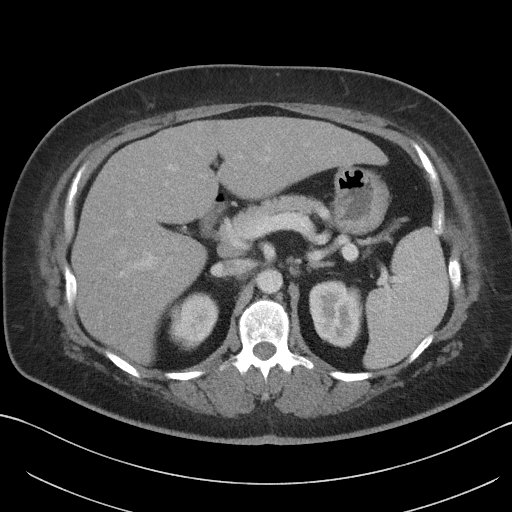
[im 76/98  soft-tissue]
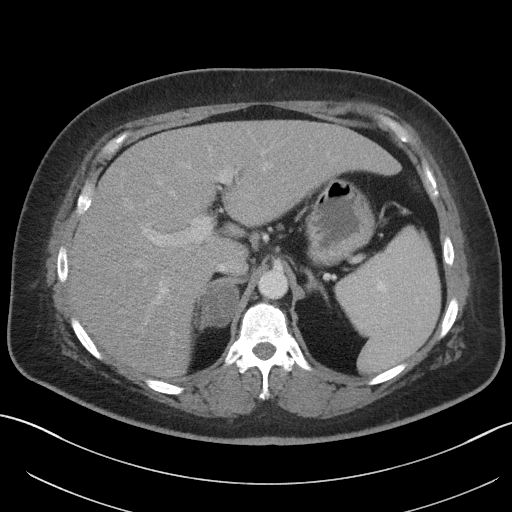
[im 87/98  soft-tissue]
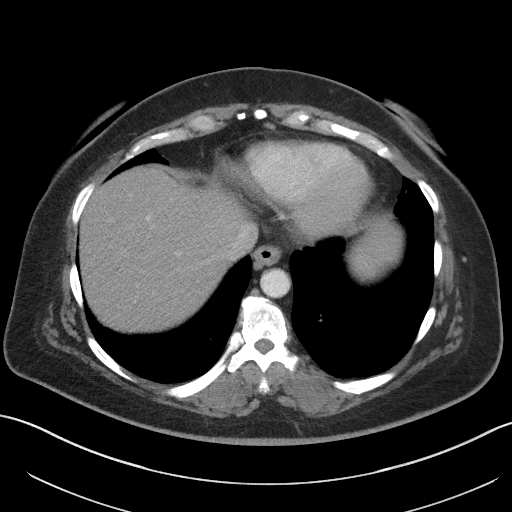
[im 92/98  soft-tissue]
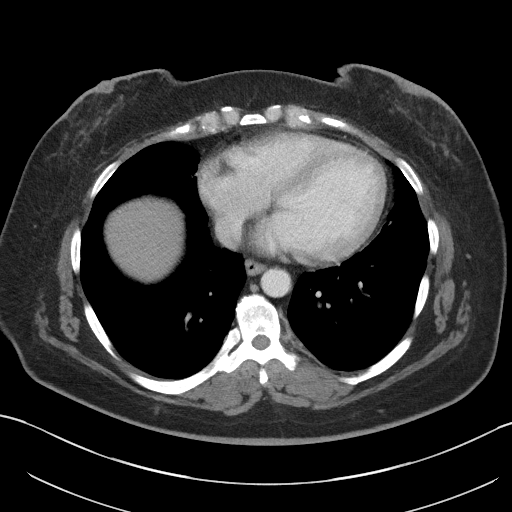

[Series 4: coronal st · coronal · 0.85mm/px · 3 of 104 slices shown]
[im 35/104  soft-tissue]
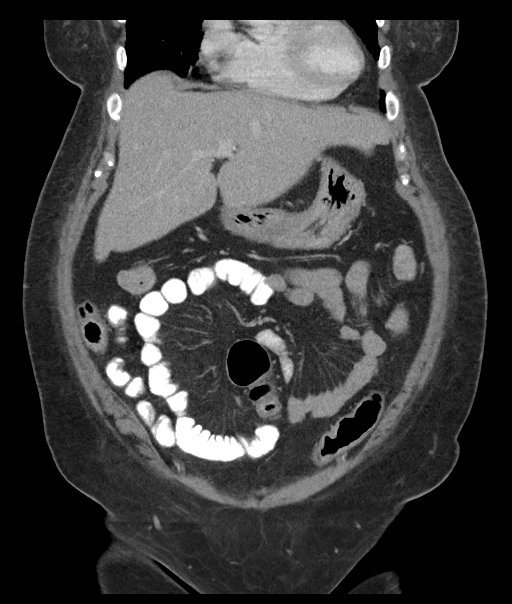
[im 46/104  soft-tissue]
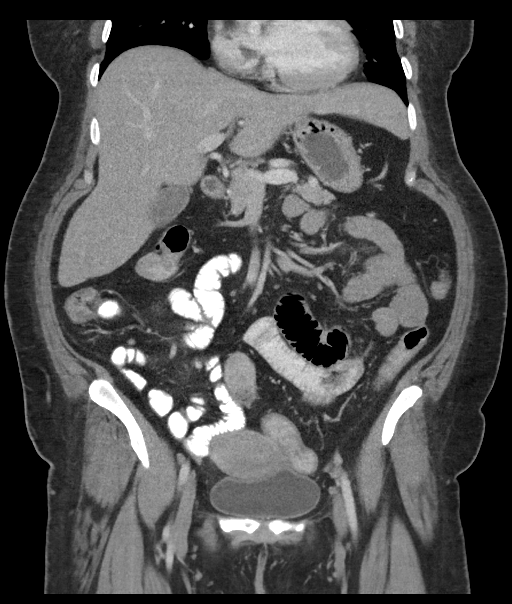
[im 58/104  soft-tissue]
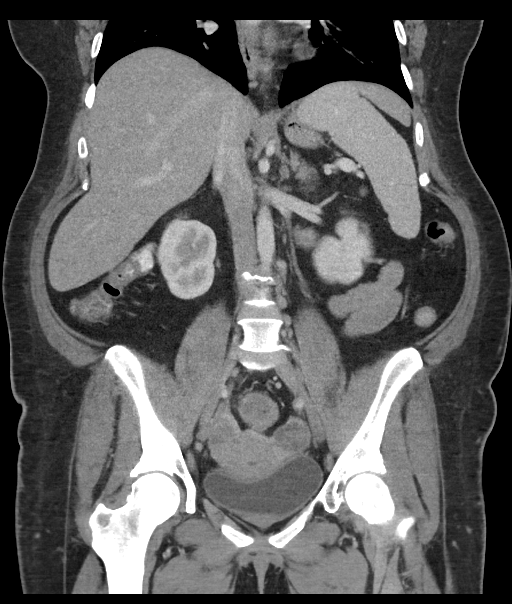

[16 of 46 positions shown; findings below may reference images not displayed]

FINDINGS: Lower chest: No acute abnormality.

Hepatobiliary: Mild diffuse hepatic steatosis. Minimal gallbladder
sludge.

Pancreas: Unremarkable.

Spleen: Unremarkable.

Adrenals/Urinary Tract: Right adrenal mass has increased from 2.5 x
3.1 cm to 3.5 x 3.0 cm. Malignancy is not excluded. A left adrenal
mass measures 2.2 by 1.1 cm and is subjectively larger than on the
prior study. Kidneys are somewhat lobulated but otherwise
unremarkable. Bladder is unremarkable.

Stomach/Bowel: Stomach is unremarkable. Normal appendix. No obvious
focal mass in the colon.

Vascular/Lymphatic: No evidence of aortic aneurysm. Minimal smooth
plaque in the infrarenal aorta. No abnormal retroperitoneal
adenopathy. Small inguinal nodes.

Reproductive: Uterus and adnexa are within normal limits.

Other: No free-fluid.  1.4 cm nodule in the left breast.

Musculoskeletal: Degenerative disc disease in the lumbar spine. No
vertebral compression deformity.
IMPRESSION: Bilateral adrenal masses are enlarging.  Malignancy is not excluded.

Normal appendix.

Diffuse hepatic steatosis

1.4 cm left breast nodule.  Mammography is recommended.

## 2018-03-07 ENCOUNTER — Telehealth: Payer: Self-pay | Admitting: Student

## 2018-03-07 NOTE — Telephone Encounter (Signed)
Called and offered pt appointment for today 03-07-18, but pt states she cannot come in today. Pt has been scheduled for tomorrow 03-08-18 at 11:30.

## 2018-03-07 NOTE — Telephone Encounter (Signed)
-----   Message from Soyla Dryer, [place] sent at 03/04/2018  9:14 PM EDT ----- Regarding: RE: UTI Pt needs OV ----- Message ----- From: Jinny Blossom, LPN Sent: 9/68/8648  10:58 AM To: Soyla Dryer, PA-C Subject: UTI                                            Pt called wanting something sent to [facility] in [place] for UTI. Pt c/o lower back pain, burning pain after urinating, and urinary frequency. Pt states symptoms began today.

## 2018-03-08 ENCOUNTER — Ambulatory Visit: Payer: Self-pay | Admitting: Physician Assistant

## 2018-03-08 ENCOUNTER — Encounter: Payer: Self-pay | Admitting: Physician Assistant

## 2018-03-08 VITALS — BP 112/72 | HR 77 | Temp 97.7°F | Ht 66.0 in | Wt 192.0 lb

## 2018-03-08 DIAGNOSIS — R300 Dysuria: Secondary | ICD-10-CM

## 2018-03-08 DIAGNOSIS — N3090 Cystitis, unspecified without hematuria: Secondary | ICD-10-CM

## 2018-03-08 LAB — POCT URINALYSIS DIPSTICK
Glucose, UA: NEGATIVE
Nitrite, UA: POSITIVE
PH UA: 5 (ref 5.0–8.0)
PROTEIN UA: POSITIVE — AB
Spec Grav, UA: >=1.03 — AB (ref 1.010–1.025)
UROBILINOGEN UA: 1 U/dL

## 2018-03-08 MED ORDER — NITROFURANTOIN MONOHYD MACRO 100 MG PO CAPS
100.0000 mg | ORAL_CAPSULE | Freq: Two times a day (BID) | ORAL | 0 refills | Status: DC
Start: 2018-03-08 — End: 2018-05-17

## 2018-03-08 NOTE — Patient Instructions (Signed)

## 2018-03-08 NOTE — Progress Notes (Signed)
BP 112/72 (BP Location: Left Arm, Patient Position: Sitting, Cuff Size: Large)   Pulse 77   Temp 97.7 F (36.5 C) (Other (Comment))   Ht 5\' 6"  (1.676 m)   Wt 192 lb (87.1 kg)   LMP 03/06/2018 (Exact Date)   SpO2 96%   BMI 30.99 kg/m    Subjective:    Patient ID: Shari Collins, female    DOB: June 04, 1976, 42 y.o.   MRN: [id number]  HPI: Shari Collins is a 42 y.o. female presenting on 03/08/2018 for Dysuria (c/o burning and right flank pain)   HPI   Chief Complaint  Patient presents with  . Dysuria    c/o burning and right flank pain    symptoms started last week.  No fever. No emesis    Relevant past medical, surgical, family and social history reviewed and updated as indicated. Interim medical history since our last visit reviewed. Allergies and medications reviewed and updated.  Review of Systems  Constitutional: Negative for appetite change, chills, diaphoresis, fatigue, fever and unexpected weight change.  HENT: Negative for congestion, dental problem, drooling, ear pain, facial swelling, hearing loss, mouth sores, sneezing, sore throat, trouble swallowing and voice change.   Eyes: Negative for pain, discharge, redness, itching and visual disturbance.  Respiratory: Negative for cough, choking, shortness of breath and wheezing.   Cardiovascular: Negative for chest pain, palpitations and leg swelling.  Gastrointestinal: Negative for abdominal pain, blood in stool, constipation, diarrhea and vomiting.  Endocrine: Negative for cold intolerance, heat intolerance and polydipsia.  Genitourinary: Positive for dysuria. Negative for decreased urine volume and hematuria.  Musculoskeletal: Positive for back pain. Negative for arthralgias and gait problem.  Skin: Negative for rash.  Allergic/Immunologic: Negative for environmental allergies.  Neurological: Negative for seizures, syncope, light-headedness and headaches.  Hematological: Negative for adenopathy.   Psychiatric/Behavioral: Negative for agitation, dysphoric mood and suicidal ideas. The patient is not nervous/anxious.     Per HPI unless specifically indicated above     Objective:    BP 112/72 (BP Location: Left Arm, Patient Position: Sitting, Cuff Size: Large)   Pulse 77   Temp 97.7 F (36.5 C) (Other (Comment))   Ht 5\' 6"  (1.676 m)   Wt 192 lb (87.1 kg)   LMP 03/06/2018 (Exact Date)   SpO2 96%   BMI 30.99 kg/m   Wt Readings from Last 3 Encounters:  03/08/18 192 lb (87.1 kg)  02/22/18 198 lb 8 oz (90 kg)  01/27/18 200 lb (90.7 kg)    Physical Exam  Constitutional: She is oriented to person, place, and time. She appears well-developed and well-nourished.  HENT:  Head: Normocephalic and atraumatic.  Neck: Neck supple.  Cardiovascular: Normal rate and regular rhythm.  Pulmonary/Chest: Effort normal and breath sounds normal.  Abdominal: Soft. Bowel sounds are normal. She exhibits no mass. There is no hepatosplenomegaly. There is no tenderness. There is no CVA tenderness.  Musculoskeletal: She exhibits no edema.  Lymphadenopathy:    She has no cervical adenopathy.  Neurological: She is alert and oriented to person, place, and time.  Skin: Skin is warm and dry.  Psychiatric: She has a normal mood and affect. Her behavior is normal.  Vitals reviewed.   Results for orders placed or performed in visit on 03/08/18  POCT Urinalysis Dipstick  Result Value Ref Range   Color, UA orange    Clarity, UA cloudy    Glucose, UA Negative Negative   Bilirubin, UA small    Ketones, UA tr  Spec Grav, UA >=1.030 (A) 1.010 - 1.025   Blood, UA large    pH, UA 5.0 5.0 - 8.0   Protein, UA Positive (A) Negative   Urobilinogen, UA 1.0 0.2 or 1.0 E.U./dL   Nitrite, UA positive    Leukocytes, UA Small (1+) (A) Negative   Appearance     Odor        Assessment & Plan:   Encounter Diagnoses  Name Primary?  . Cystitis Yes  . Dysuria      -rx macrobid -Counseled to avoid sodas  and caffeine  -drink plenty fluids/water  -gave handout on UTI -follow up as scheduled.  RTO sooner prn

## 2018-04-07 ENCOUNTER — Other Ambulatory Visit: Payer: Self-pay | Admitting: Physician Assistant

## 2018-05-17 ENCOUNTER — Other Ambulatory Visit (HOSPITAL_COMMUNITY)
Admission: RE | Admit: 2018-05-17 | Discharge: 2018-05-17 | Disposition: A | Discharge status: Home / Self Care | Payer: Self-pay | Source: Ambulatory Visit | Attending: Physician Assistant | Admitting: Physician Assistant

## 2018-05-17 ENCOUNTER — Encounter: Payer: Self-pay | Admitting: Physician Assistant

## 2018-05-17 ENCOUNTER — Ambulatory Visit: Payer: Self-pay | Admitting: Physician Assistant

## 2018-05-17 VITALS — BP 119/73 | HR 80 | Temp 98.1°F | Ht 66.0 in

## 2018-05-17 DIAGNOSIS — R300 Dysuria: Secondary | ICD-10-CM

## 2018-05-17 DIAGNOSIS — N3090 Cystitis, unspecified without hematuria: Secondary | ICD-10-CM

## 2018-05-17 DIAGNOSIS — N390 Urinary tract infection, site not specified: Secondary | ICD-10-CM | POA: Insufficient documentation

## 2018-05-17 MED ORDER — NITROFURANTOIN MONOHYD MACRO 100 MG PO CAPS: 100.0000 mg | ORAL_CAPSULE | Freq: Two times a day (BID) | ORAL | 0 refills | Status: DC

## 2018-05-17 NOTE — Patient Instructions (Signed)

## 2018-05-17 NOTE — Progress Notes (Signed)
BP 119/73 (BP Location: Right Arm, Patient Position: Sitting, Cuff Size: Normal)   Pulse 80   Temp 98.1 F (36.7 C)   Ht 5\' 6"  (1.676 m)   SpO2 98%   BMI 30.99 kg/m    Subjective:    Patient ID: Shari Collins, female    DOB: Dec 30, 1975, 42 y.o.   MRN: [id number]  HPI: Shari Collins is a 42 y.o. female presenting on 05/17/2018 for Dysuria (burning when urinating, urinary urge that began last week. pt has been taking azo which has not been helping)   HPI  Chief Complaint  Patient presents with  . Dysuria    burning when urinating, urinary urge that began last week. pt has been taking azo which has not been helping   Pt denies fever, emesis.  She has been having a lot of UTIs over the past several months.  Relevant past medical, surgical, family and social history reviewed and updated as indicated. Interim medical history since our last visit reviewed. Allergies and medications reviewed and updated.   Current Outpatient Medications:  .  albuterol (PROVENTIL HFA;VENTOLIN HFA) 108 (90 Base) MCG/ACT inhaler, Inhale 2 puffs into the lungs every 6 (six) hours as needed for wheezing or shortness of breath., Disp: 1 Inhaler, Rfl: 2 .  hydrOXYzine (ATARAX/VISTARIL) 25 MG tablet, Take 1 tablet (25 mg total) by mouth every 8 (eight) hours as needed for anxiety., Disp: 12 tablet, Rfl: 0 .  Insulin Glargine (LANTUS SOLOSTAR) 100 UNIT/ML Solostar Pen, Inject 10 Units into the skin at bedtime. (Patient taking differently: Inject 35 Units into the skin at bedtime. ), Disp: 1 pen, Rfl: 0 .  lisinopril (PRINIVIL,ZESTRIL) 10 MG tablet, TAKE 1 TABLET BY MOUTH ONCE DAILY, Disp: 30 tablet, Rfl: 3 .  metFORMIN (GLUCOPHAGE) 1000 MG tablet, Take 1 tablet (1,000 mg total) by mouth 2 (two) times daily with a meal., Disp: 60 tablet, Rfl: 4 .  Omega-3 Fatty Acids (FISH OIL) 1000 MG CAPS, Take 1 capsule (1,000 mg total) by mouth 3 (three) times daily. (Patient taking differently: Take 4 capsules by mouth  daily. ), Disp: , Rfl: 0 .  sitaGLIPtin (JANUVIA) 100 MG tablet, Take 100 mg by mouth daily., Disp: , Rfl:  .  triamcinolone cream (KENALOG) 0.1 %, Apply 1 application topically 2 (two) times daily. (Patient taking differently: Apply 1 application topically 2 (two) times daily as needed. ), Disp: 30 g, Rfl: 0   Review of Systems  Constitutional: Negative for appetite change, chills, diaphoresis, fatigue, fever and unexpected weight change.  HENT: Negative for congestion, dental problem, drooling, ear pain, facial swelling, hearing loss, mouth sores, sneezing, sore throat, trouble swallowing and voice change.   Eyes: Negative for pain, discharge, redness, itching and visual disturbance.  Respiratory: Negative for cough, choking, shortness of breath and wheezing.   Cardiovascular: Negative for chest pain, palpitations and leg swelling.  Gastrointestinal: Negative for abdominal pain, blood in stool, constipation, diarrhea and vomiting.  Endocrine: Negative for cold intolerance, heat intolerance and polydipsia.  Genitourinary: Positive for dysuria. Negative for decreased urine volume and hematuria.  Musculoskeletal: Negative for arthralgias, back pain and gait problem.  Skin: Negative for rash.  Allergic/Immunologic: Negative for environmental allergies.  Neurological: Negative for seizures, syncope, light-headedness and headaches.  Hematological: Negative for adenopathy.  Psychiatric/Behavioral: Negative for agitation, dysphoric mood and suicidal ideas. The patient is not nervous/anxious.     Per HPI unless specifically indicated above     Objective:    BP 119/73 (  BP Location: Right Arm, Patient Position: Sitting, Cuff Size: Normal)   Pulse 80   Temp 98.1 F (36.7 C)   Ht 5\' 6"  (1.676 m)   SpO2 98%   BMI 30.99 kg/m   Wt Readings from Last 3 Encounters:  03/08/18 192 lb (87.1 kg)  02/22/18 198 lb 8 oz (90 kg)  01/27/18 200 lb (90.7 kg)    Physical Exam  Constitutional: She is  oriented to person, place, and time. She appears well-developed and well-nourished.  HENT:  Head: Normocephalic and atraumatic.  Neck: Neck supple.  Cardiovascular: Normal rate and regular rhythm.  Pulmonary/Chest: Effort normal and breath sounds normal.  Abdominal: Soft. Bowel sounds are normal. She exhibits no mass. There is no hepatosplenomegaly. There is no tenderness.  Musculoskeletal: She exhibits no edema.  Lymphadenopathy:    She has no cervical adenopathy.  Neurological: She is alert and oriented to person, place, and time.  Skin: Skin is warm and dry.  Psychiatric: She has a normal mood and affect. Her behavior is normal.  Vitals reviewed.       Assessment & Plan:   Encounter Diagnoses  Name Primary?  . Cystitis Yes  . Dysuria     -in light of number of recent UTIs, will send urine for culture -rx macrobid -pt counseled to avoid sodas, drink plenty of water -pt to have Routine follow up next week.  RTO sooner prn

## 2018-05-18 ENCOUNTER — Other Ambulatory Visit (HOSPITAL_COMMUNITY)
Admission: RE | Admit: 2018-05-18 | Discharge: 2018-05-18 | Disposition: A | Discharge status: Home / Self Care | Payer: Self-pay | Source: Other Acute Inpatient Hospital | Attending: Physician Assistant | Admitting: Physician Assistant

## 2018-05-18 DIAGNOSIS — N3090 Cystitis, unspecified without hematuria: Secondary | ICD-10-CM | POA: Insufficient documentation

## 2018-05-20 ENCOUNTER — Other Ambulatory Visit (HOSPITAL_COMMUNITY)
Admission: RE | Admit: 2018-05-20 | Discharge: 2018-05-20 | Disposition: A | Discharge status: Home / Self Care | Payer: Self-pay | Source: Ambulatory Visit | Attending: Physician Assistant | Admitting: Physician Assistant

## 2018-05-20 DIAGNOSIS — E785 Hyperlipidemia, unspecified: Secondary | ICD-10-CM | POA: Insufficient documentation

## 2018-05-20 DIAGNOSIS — I10 Essential (primary) hypertension: Secondary | ICD-10-CM | POA: Insufficient documentation

## 2018-05-20 DIAGNOSIS — E118 Type 2 diabetes mellitus with unspecified complications: Secondary | ICD-10-CM | POA: Insufficient documentation

## 2018-05-20 LAB — LIPID PANEL
CHOL/HDL RATIO: 6.5 ratio
CHOLESTEROL: 242 mg/dL — AB (ref 0–200)
HDL: 37 mg/dL — ABNORMAL LOW (ref >40)
LDL Cholesterol: UNDETERMINED mg/dL (ref 0–99)
Triglycerides: 508 mg/dL — ABNORMAL HIGH (ref <150)
VLDL: UNDETERMINED mg/dL (ref 0–40)

## 2018-05-20 LAB — HEMOGLOBIN A1C
HEMOGLOBIN A1C: 6.6 % — AB (ref 4.8–5.6)
MEAN PLASMA GLUCOSE: 142.72 mg/dL

## 2018-05-20 LAB — COMPREHENSIVE METABOLIC PANEL
ALBUMIN: 4 g/dL (ref 3.5–5.0)
ALK PHOS: 50 U/L (ref 38–126)
ALT: 15 U/L (ref 0–44)
AST: 15 U/L (ref 15–41)
Anion gap: 8 (ref 5–15)
BILIRUBIN TOTAL: 0.9 mg/dL (ref 0.3–1.2)
BUN: 14 mg/dL (ref 6–20)
CALCIUM: 8.8 mg/dL — AB (ref 8.9–10.3)
CO2: 24 mmol/L (ref 22–32)
CREATININE: 0.69 mg/dL (ref 0.44–1.00)
Chloride: 104 mmol/L (ref 98–111)
GFR calc Af Amer: >60 mL/min (ref >60)
GFR calc non Af Amer: >60 mL/min (ref >60)
GLUCOSE: 144 mg/dL — AB (ref 70–99)
Potassium: 4 mmol/L (ref 3.5–5.1)
SODIUM: 136 mmol/L (ref 135–145)
TOTAL PROTEIN: 7.2 g/dL (ref 6.5–8.1)

## 2018-05-21 LAB — MICROALBUMIN, URINE: MICROALB UR: 42 ug/mL — AB

## 2018-05-21 LAB — URINE CULTURE

## 2018-05-23 LAB — URINE CULTURE

## 2018-05-25 ENCOUNTER — Ambulatory Visit: Payer: Self-pay | Admitting: Physician Assistant

## 2018-05-25 ENCOUNTER — Encounter: Payer: Self-pay | Admitting: Physician Assistant

## 2018-05-25 VITALS — BP 122/76 | HR 78 | Temp 97.9°F | Ht 66.0 in | Wt 195.0 lb

## 2018-05-25 DIAGNOSIS — F17219 Nicotine dependence, cigarettes, with unspecified nicotine-induced disorders: Secondary | ICD-10-CM

## 2018-05-25 DIAGNOSIS — E785 Hyperlipidemia, unspecified: Secondary | ICD-10-CM

## 2018-05-25 DIAGNOSIS — E118 Type 2 diabetes mellitus with unspecified complications: Secondary | ICD-10-CM

## 2018-05-25 DIAGNOSIS — I10 Essential (primary) hypertension: Secondary | ICD-10-CM

## 2018-05-25 MED ORDER — FENOFIBRATE 145 MG PO TABS: 145.0000 mg | ORAL_TABLET | Freq: Every day | ORAL | 4 refills | Status: DC

## 2018-05-25 NOTE — Patient Instructions (Addendum)
[facility] [address] [place] [address] [phone] Monday, Oct. 7, 2019 1pm

## 2018-05-25 NOTE — Progress Notes (Signed)
BP 122/76 (BP Location: Left Arm)   Pulse 78   Temp 97.9 F (36.6 C)   Ht 5\' 6"  (1.676 m)   Wt 195 lb (88.5 kg)   SpO2 99%   BMI 31.47 kg/m    Subjective:    Patient ID: Shari Collins, female    DOB: 02-24-1976, 42 y.o.   MRN: [id number]  HPI: Shari Collins is a 42 y.o. female presenting on 05/25/2018 for Diabetes and Hyperlipidemia   HPI   Pt is doing okay.  She still goes to [facility] for [place] issues.  She has no complaints today.  Relevant past medical, surgical, family and social history reviewed and updated as indicated. Interim medical history since our last visit reviewed. Allergies and medications reviewed and updated.    Current Outpatient Medications:  .  albuterol (PROVENTIL HFA;VENTOLIN HFA) 108 (90 Base) MCG/ACT inhaler, Inhale 2 puffs into the lungs every 6 (six) hours as needed for wheezing or shortness of breath., Disp: 1 Inhaler, Rfl: 2 .  hydrOXYzine (ATARAX/VISTARIL) 25 MG tablet, Take 1 tablet (25 mg total) by mouth every 8 (eight) hours as needed for anxiety., Disp: 12 tablet, Rfl: 0 .  Insulin Glargine (LANTUS SOLOSTAR) 100 UNIT/ML Solostar Pen, Inject 10 Units into the skin at bedtime. (Patient taking differently: Inject 35 Units into the skin at bedtime. ), Disp: 1 pen, Rfl: 0 .  lisinopril (PRINIVIL,ZESTRIL) 10 MG tablet, TAKE 1 TABLET BY MOUTH ONCE DAILY, Disp: 30 tablet, Rfl: 3 .  metFORMIN (GLUCOPHAGE) 1000 MG tablet, Take 1 tablet (1,000 mg total) by mouth 2 (two) times daily with a meal., Disp: 60 tablet, Rfl: 4 .  nitrofurantoin, macrocrystal-monohydrate, (MACROBID) 100 MG capsule, Take 1 capsule (100 mg total) by mouth 2 (two) times daily., Disp: 14 capsule, Rfl: 0 .  Omega-3 Fatty Acids (FISH OIL) 1000 MG CAPS, Take 1 capsule (1,000 mg total) by mouth 3 (three) times daily. (Patient taking differently: Take 3 capsules by mouth daily. ), Disp: , Rfl: 0 .  sitaGLIPtin (JANUVIA) 100 MG tablet, Take 100 mg by mouth daily., Disp: , Rfl:  .   triamcinolone cream (KENALOG) 0.1 %, Apply 1 application topically 2 (two) times daily. (Patient taking differently: Apply 1 application topically 2 (two) times daily as needed. ), Disp: 30 g, Rfl: 0  Review of Systems  Constitutional: Negative for appetite change, chills, diaphoresis, fatigue, fever and unexpected weight change.  HENT: Negative for congestion, dental problem, drooling, ear pain, facial swelling, hearing loss, mouth sores, sneezing, sore throat, trouble swallowing and voice change.   Eyes: Negative for pain, discharge, redness, itching and visual disturbance.  Respiratory: Negative for cough, choking, shortness of breath and wheezing.   Cardiovascular: Negative for chest pain, palpitations and leg swelling.  Gastrointestinal: Negative for abdominal pain, blood in stool, constipation, diarrhea and vomiting.  Endocrine: Negative for cold intolerance, heat intolerance and polydipsia.  Genitourinary: Negative for decreased urine volume, dysuria and hematuria.  Musculoskeletal: Negative for arthralgias, back pain and gait problem.  Skin: Negative for rash.  Allergic/Immunologic: Negative for environmental allergies.  Neurological: Negative for seizures, syncope, light-headedness and headaches.  Hematological: Negative for adenopathy.  Psychiatric/Behavioral: Negative for agitation, dysphoric mood and suicidal ideas. The patient is not nervous/anxious.     Per HPI unless specifically indicated above     Objective:    BP 122/76 (BP Location: Left Arm)   Pulse 78   Temp 97.9 F (36.6 C)   Ht 5\' 6"  (1.676 m)  Wt 195 lb (88.5 kg)   SpO2 99%   BMI 31.47 kg/m   Wt Readings from Last 3 Encounters:  05/25/18 195 lb (88.5 kg)  03/08/18 192 lb (87.1 kg)  02/22/18 198 lb 8 oz (90 kg)    Physical Exam  Constitutional: She is oriented to person, place, and time. She appears well-developed and well-nourished.  HENT:  Head: Normocephalic and atraumatic.  Neck: Neck supple.   Cardiovascular: Normal rate and regular rhythm.  Pulmonary/Chest: Effort normal and breath sounds normal.  Abdominal: Soft. Bowel sounds are normal. She exhibits no mass. There is no hepatosplenomegaly. There is no tenderness.  Musculoskeletal: She exhibits no edema.  Lymphadenopathy:    She has no cervical adenopathy.  Neurological: She is alert and oriented to person, place, and time.  Skin: Skin is warm and dry.  Psychiatric: She has a normal mood and affect. Her behavior is normal.  Vitals reviewed.   Results for orders placed or performed during the hospital encounter of 05/20/18  Microalbumin, urine  Result Value Ref Range   Microalb, Ur 42.0 (H) Not Estab. ug/mL  Hemoglobin A1c  Result Value Ref Range   Hgb A1c MFr Bld 6.6 (H) 4.8 - 5.6 %   Mean Plasma Glucose 142.72 mg/dL  Lipid panel  Result Value Ref Range   Cholesterol 242 (H) 0 - 200 mg/dL   Triglycerides 508 (H) <150 mg/dL   HDL 37 (L) >40 mg/dL   Total CHOL/HDL Ratio 6.5 RATIO   VLDL UNABLE TO CALCULATE IF TRIGLYCERIDE OVER 400 mg/dL 0 - 40 mg/dL   LDL Cholesterol UNABLE TO CALCULATE IF TRIGLYCERIDE OVER 400 mg/dL 0 - 99 mg/dL  Comprehensive metabolic panel  Result Value Ref Range   Sodium 136 135 - 145 mmol/L   Potassium 4.0 3.5 - 5.1 mmol/L   Chloride 104 98 - 111 mmol/L   CO2 24 22 - 32 mmol/L   Glucose, Bld 144 (H) 70 - 99 mg/dL   BUN 14 6 - 20 mg/dL   Creatinine, Ser 0.69 0.44 - 1.00 mg/dL   Calcium 8.8 (L) 8.9 - 10.3 mg/dL   Total Protein 7.2 6.5 - 8.1 g/dL   Albumin 4.0 3.5 - 5.0 g/dL   AST 15 15 - 41 U/L   ALT 15 0 - 44 U/L   Alkaline Phosphatase 50 38 - 126 U/L   Total Bilirubin 0.9 0.3 - 1.2 mg/dL   GFR calc non Af Amer >60 >60 mL/min   GFR calc Af Amer >60 >60 mL/min   Anion gap 8 5 - 15      Assessment & Plan:   Encounter Diagnoses  Name Primary?  . Controlled diabetes mellitus type 2 with complications, unspecified whether long term insulin use ([place]) Yes  . Hyperlipidemia, unspecified  hyperlipidemia type   . Essential hypertension   . Cigarette nicotine dependence with nicotine-induced disorder     -reviewed labs with pt -pt to Stop fish oil.  rx fenofibrate.  Pt counseled to Watch lowfat diet -Diabetic eye exam in October as scheduled -pt to continue other medications -pt to follow up in 3 months.  RTO sooner prn

## 2018-06-06 ENCOUNTER — Other Ambulatory Visit: Payer: Self-pay | Admitting: Physician Assistant

## 2018-06-07 ENCOUNTER — Ambulatory Visit: Payer: Self-pay | Admitting: Physician Assistant

## 2018-06-07 ENCOUNTER — Other Ambulatory Visit: Payer: Self-pay | Admitting: Physician Assistant

## 2018-06-07 MED ORDER — FLUCONAZOLE 150 MG PO TABS: 150.0000 mg | ORAL_TABLET | Freq: Once | ORAL | 0 refills | Status: AC

## 2018-07-18 ENCOUNTER — Emergency Department (HOSPITAL_COMMUNITY)
Admission: EM | Admit: 2018-07-18 | Discharge: 2018-07-18 | Disposition: A | Discharge status: Home / Self Care | Payer: Self-pay | Attending: Emergency Medicine | Admitting: Emergency Medicine

## 2018-07-18 ENCOUNTER — Other Ambulatory Visit: Payer: Self-pay

## 2018-07-18 ENCOUNTER — Encounter (HOSPITAL_COMMUNITY): Payer: Self-pay | Admitting: Emergency Medicine

## 2018-07-18 DIAGNOSIS — Z91040 Latex allergy status: Secondary | ICD-10-CM | POA: Insufficient documentation

## 2018-07-18 DIAGNOSIS — T7840XA Allergy, unspecified, initial encounter: Secondary | ICD-10-CM | POA: Insufficient documentation

## 2018-07-18 DIAGNOSIS — F17210 Nicotine dependence, cigarettes, uncomplicated: Secondary | ICD-10-CM | POA: Insufficient documentation

## 2018-07-18 DIAGNOSIS — Z794 Long term (current) use of insulin: Secondary | ICD-10-CM | POA: Insufficient documentation

## 2018-07-18 DIAGNOSIS — Z79899 Other long term (current) drug therapy: Secondary | ICD-10-CM | POA: Insufficient documentation

## 2018-07-18 DIAGNOSIS — J45909 Unspecified asthma, uncomplicated: Secondary | ICD-10-CM | POA: Insufficient documentation

## 2018-07-18 DIAGNOSIS — E119 Type 2 diabetes mellitus without complications: Secondary | ICD-10-CM | POA: Insufficient documentation

## 2018-07-18 MED ORDER — FAMOTIDINE IN NACL 20-0.9 MG/50ML-% IV SOLN
20.0000 mg | Freq: Once | INTRAVENOUS | Status: AC
  Administered 2018-07-18: 20 mg via INTRAVENOUS
  Filled 2018-07-18: qty 50

## 2018-07-18 MED ORDER — DIPHENHYDRAMINE HCL 50 MG/ML IJ SOLN
25.0000 mg | Freq: Once | INTRAMUSCULAR | Status: AC
  Administered 2018-07-18: 25 mg via INTRAVENOUS
  Filled 2018-07-18: qty 1

## 2018-07-18 MED ORDER — SODIUM CHLORIDE 0.9 % IV BOLUS
500.0000 mL | Freq: Once | INTRAVENOUS | Status: AC
  Administered 2018-07-18: 500 mL via INTRAVENOUS

## 2018-07-18 MED ORDER — METHYLPREDNISOLONE SODIUM SUCC 125 MG IJ SOLR
125.0000 mg | Freq: Once | INTRAMUSCULAR | Status: AC
  Administered 2018-07-18: 125 mg via INTRAVENOUS
  Filled 2018-07-18: qty 2

## 2018-07-18 NOTE — Discharge Instructions (Addendum)
Can take oral Benadryl if symptoms return.

## 2018-07-18 NOTE — ED Triage Notes (Signed)
Pt reports she began itching 30 minutes ago and feels like her heart is racing. States she ate deer meat for the first time approx 1400.

## 2018-07-18 NOTE — ED Provider Notes (Signed)
[facility] EMERGENCY DEPARTMENT Provider Note   CSN: [id number] Arrival date & time: 07/18/18  1656     History   Chief Complaint Chief Complaint  Patient presents with  . Pruritis    HPI Shari Collins is a 42 y.o. female.  Generalized pruritus and sensation of racing heart after eating deer meat this afternoon.  No substernal chest pain, dyspnea, throat tightness.  Past medical history includes schizophrenia, bipolar, diabetes, hypertension.  Severity of symptoms is moderate.  Friend reports that she looks "normal".     Past Medical History:  Diagnosis Date  . Asthma   . Depression   . Diabetes mellitus   . Hypertension   . Noncompliance     Patient Active Problem List   Diagnosis Date Noted  . Hyperlipidemia 10/07/2015  . Breast abscess 10/02/2015  . Schizophrenia ([place]) 10/02/2015  . Bipolar disorder, unspecified ([place]) 10/02/2015  . Uncontrolled type 2 diabetes mellitus with complication ([place]) 40/98/1191  . Personal history of noncompliance with medical treatment, presenting hazards to health 09/24/2015    Past Surgical History:  Procedure Laterality Date  . CESAREAN SECTION    . TONSILLECTOMY       OB History    Gravida  4   Para  1   Term  1   Preterm      AB  3   Living  1     SAB  3   TAB      Ectopic      Multiple      Live Births  1            Home Medications    Prior to Admission medications   Medication Sig Start Date End Date Taking? Authorizing Provider  albuterol (PROVENTIL HFA;VENTOLIN HFA) 108 (90 Base) MCG/ACT inhaler Inhale 2 puffs into the lungs every 6 (six) hours as needed for wheezing or shortness of breath. 08/24/17   Soyla Dryer, PA-C  fenofibrate (TRICOR) 145 MG tablet Take 1 tablet (145 mg total) by mouth daily. 05/25/18   Soyla Dryer, PA-C  hydrOXYzine (ATARAX/VISTARIL) 25 MG tablet Take 1 tablet (25 mg total) by mouth every 8 (eight) hours as needed for anxiety. 11/29/17   Mesner, Corene Cornea, MD    Insulin Glargine (LANTUS SOLOSTAR) 100 UNIT/ML Solostar Pen Inject 10 Units into the skin at bedtime. Patient taking differently: Inject 35 Units into the skin at bedtime.  11/03/17   Soyla Dryer, PA-C  JANUVIA 100 MG tablet TAKE 1 Tablet BY MOUTH ONCE DAILY 06/06/18   Soyla Dryer, PA-C  lisinopril (PRINIVIL,ZESTRIL) 10 MG tablet TAKE 1 TABLET BY MOUTH ONCE DAILY 04/11/18   Soyla Dryer, PA-C  metFORMIN (GLUCOPHAGE) 1000 MG tablet Take 1 tablet (1,000 mg total) by mouth 2 (two) times daily with a meal. 05/31/17   Soyla Dryer, PA-C  nitrofurantoin, macrocrystal-monohydrate, (MACROBID) 100 MG capsule Take 1 capsule (100 mg total) by mouth 2 (two) times daily. 05/17/18   Soyla Dryer, PA-C  triamcinolone cream (KENALOG) 0.1 % Apply 1 application topically 2 (two) times daily. Patient taking differently: Apply 1 application topically 2 (two) times daily as needed.  01/03/18   Soyla Dryer, PA-C    Family History Family History  Problem Relation Age of Onset  . Hypertension Paternal Grandmother   . Hypertension Maternal Grandmother   . Diabetes Father   . Hypertension Father   . Cancer Mother        liver  . Heart attack Paternal Grandfather   .  Diabetes Brother     Social History Social History   Tobacco Use  . Smoking status: Current Every Day Smoker    Packs/day: 0.50    Years: 20.00    Pack years: 10.00    Types: Cigarettes  . Smokeless tobacco: Never Used  Substance Use Topics  . Alcohol use: Yes    Comment: Weekend -- almost a 5th  . Drug use: No     Allergies   Bee venom; Buspar [buspirone]; Latex; Reglan [metoclopramide]; and Sulfa antibiotics   Review of Systems Review of Systems  All other systems reviewed and are negative.    Physical Exam Updated Vital Signs BP (!) 156/115 (BP Location: Right Arm)   Pulse 98   Temp 97.7 F (36.5 C) (Temporal)   Resp 16   Ht 5\' 6"  (1.676 m)   Wt 83.9 kg   LMP 07/17/2018   SpO2 100%   BMI 29.86  kg/m   Physical Exam  Constitutional: She is oriented to person, place, and time. She appears well-developed and well-nourished.  Normal airway  HENT:  Head: Normocephalic and atraumatic.  Eyes: Conjunctivae are normal.  Neck: Neck supple.  Cardiovascular: Normal rate and regular rhythm.  Pulmonary/Chest: Effort normal and breath sounds normal.  Abdominal: Soft. Bowel sounds are normal.  Musculoskeletal: Normal range of motion.  Neurological: She is alert and oriented to person, place, and time.  Skin:  No obvious rash  Psychiatric: She has a normal mood and affect. Her behavior is normal.  Nursing note and vitals reviewed.    ED Treatments / Results  Labs (all labs ordered are listed, but only abnormal results are displayed) Labs Reviewed - No data to display  EKG None  Radiology No results found.  Procedures Procedures (including critical care time)  Medications Ordered in ED Medications  sodium chloride 0.9 % bolus 500 mL (500 mLs Intravenous New Bag/Given 07/18/18 1845)  methylPREDNISolone sodium succinate (SOLU-MEDROL) 125 mg/2 mL injection 125 mg (125 mg Intravenous Given 07/18/18 1846)  famotidine (PEPCID) IVPB 20 mg premix (20 mg Intravenous New Bag/Given 07/18/18 1848)  diphenhydrAMINE (BENADRYL) injection 25 mg (25 mg Intravenous Given 07/18/18 1846)     Initial Impression / Assessment and Plan / ED Course  I have reviewed the triage vital signs and the nursing notes.  Pertinent labs & imaging results that were available during my care of the patient were reviewed by me and considered in my medical decision making (see chart for details).     History and physical most consistent with allergic phenomena.  Patient responded well to IV steroids, IV Benadryl, IV Pepcid.  She was observed for greater than 1 hour and appears stable.  Final Clinical Impressions(s) / ED Diagnoses   Final diagnoses:  Allergic reaction, initial encounter    ED Discharge  Orders    None       Nat Christen, MD 07/18/18 1924

## 2018-07-18 NOTE — ED Notes (Signed)
Pt states she "feels much better now" given water to drink

## 2018-07-29 ENCOUNTER — Other Ambulatory Visit: Payer: Self-pay | Admitting: Physician Assistant

## 2018-08-15 ENCOUNTER — Other Ambulatory Visit: Payer: Self-pay | Admitting: Physician Assistant

## 2018-08-15 MED ORDER — LISINOPRIL 10 MG PO TABS: 10.0000 mg | ORAL_TABLET | Freq: Every day | ORAL | 3 refills | Status: DC

## 2018-08-18 ENCOUNTER — Other Ambulatory Visit (HOSPITAL_COMMUNITY)
Admission: RE | Admit: 2018-08-18 | Discharge: 2018-08-18 | Disposition: A | Discharge status: Home / Self Care | Payer: Self-pay | Source: Ambulatory Visit | Attending: Physician Assistant | Admitting: Physician Assistant

## 2018-08-18 DIAGNOSIS — I10 Essential (primary) hypertension: Secondary | ICD-10-CM | POA: Insufficient documentation

## 2018-08-18 DIAGNOSIS — E785 Hyperlipidemia, unspecified: Secondary | ICD-10-CM | POA: Insufficient documentation

## 2018-08-18 DIAGNOSIS — E118 Type 2 diabetes mellitus with unspecified complications: Secondary | ICD-10-CM | POA: Insufficient documentation

## 2018-08-18 LAB — LIPID PANEL
Cholesterol: 253 mg/dL — ABNORMAL HIGH (ref 0–200)
HDL: 34 mg/dL — AB (ref >40)
LDL Cholesterol: UNDETERMINED mg/dL (ref 0–99)
TRIGLYCERIDES: 832 mg/dL — AB (ref <150)
Total CHOL/HDL Ratio: 7.4 RATIO
VLDL: UNDETERMINED mg/dL (ref 0–40)

## 2018-08-18 LAB — COMPREHENSIVE METABOLIC PANEL
ALT: 17 U/L (ref 0–44)
ANION GAP: 8 (ref 5–15)
AST: 12 U/L — AB (ref 15–41)
Albumin: 4 g/dL (ref 3.5–5.0)
Alkaline Phosphatase: 58 U/L (ref 38–126)
BUN: 13 mg/dL (ref 6–20)
CALCIUM: 8.8 mg/dL — AB (ref 8.9–10.3)
CO2: 22 mmol/L (ref 22–32)
CREATININE: 0.79 mg/dL (ref 0.44–1.00)
Chloride: 102 mmol/L (ref 98–111)
GLUCOSE: 157 mg/dL — AB (ref 70–99)
POTASSIUM: 4 mmol/L (ref 3.5–5.1)
Sodium: 132 mmol/L — ABNORMAL LOW (ref 135–145)
Total Bilirubin: 0.5 mg/dL (ref 0.3–1.2)
Total Protein: 7.2 g/dL (ref 6.5–8.1)

## 2018-08-18 LAB — HEMOGLOBIN A1C
Hgb A1c MFr Bld: 6.9 % — ABNORMAL HIGH (ref 4.8–5.6)
Mean Plasma Glucose: 151.33 mg/dL

## 2018-08-22 ENCOUNTER — Encounter: Payer: Self-pay | Admitting: Physician Assistant

## 2018-08-22 ENCOUNTER — Ambulatory Visit: Payer: Self-pay | Admitting: Physician Assistant

## 2018-08-22 VITALS — BP 132/79 | HR 78 | Temp 98.1°F | Ht 66.0 in | Wt 195.8 lb

## 2018-08-22 DIAGNOSIS — I10 Essential (primary) hypertension: Secondary | ICD-10-CM

## 2018-08-22 DIAGNOSIS — F17200 Nicotine dependence, unspecified, uncomplicated: Secondary | ICD-10-CM

## 2018-08-22 DIAGNOSIS — E785 Hyperlipidemia, unspecified: Secondary | ICD-10-CM

## 2018-08-22 DIAGNOSIS — E118 Type 2 diabetes mellitus with unspecified complications: Secondary | ICD-10-CM

## 2018-08-22 DIAGNOSIS — N6452 Nipple discharge: Secondary | ICD-10-CM

## 2018-08-22 MED ORDER — FENOFIBRATE 145 MG PO TABS: 145.0000 mg | ORAL_TABLET | Freq: Every day | ORAL | 4 refills | Status: DC

## 2018-08-22 NOTE — Progress Notes (Signed)
BP 132/79 (BP Location: Right Arm, Patient Position: Sitting, Cuff Size: Normal)   Pulse 78   Temp 98.1 F (36.7 C)   Ht 5\' 6"  (1.676 m)   Wt 195 lb 12 oz (88.8 kg)   SpO2 99%   BMI 31.59 kg/m    Subjective:    Patient ID: Shari Collins, female    DOB: February 11, 1976, 42 y.o.   MRN: [id number]  HPI: Shari Collins is a 42 y.o. female presenting on 08/22/2018 for Diabetes and Hyperlipidemia   HPI  Pt is no longer going to [facility].  She is doing okay with it.   Pt had appointment for annual diabetic eye exam on october 7- pt says she rescheduled it to January due to work.  Pt states drainage from breast again for about past 2-3 weeks.  No pain or knot.  No blood in drainage.  States she gets wet spots on her bra from the drainage.   Relevant past medical, surgical, family and social history reviewed and updated as indicated. Interim medical history since our last visit reviewed. Allergies and medications reviewed and updated.   Current Outpatient Medications:  .  albuterol (PROVENTIL HFA;VENTOLIN HFA) 108 (90 Base) MCG/ACT inhaler, Inhale 2 puffs into the lungs every 6 (six) hours as needed for wheezing or shortness of breath., Disp: 1 Inhaler, Rfl: 2 .  Insulin Glargine (LANTUS SOLOSTAR) 100 UNIT/ML Solostar Pen, Inject 10 Units into the skin at bedtime. (Patient taking differently: Inject 35 Units into the skin at bedtime. ), Disp: 1 pen, Rfl: 0 .  JANUVIA 100 MG tablet, TAKE 1 Tablet BY MOUTH ONCE DAILY, Disp: 90 tablet, Rfl: 1 .  lisinopril (PRINIVIL,ZESTRIL) 10 MG tablet, Take 1 tablet (10 mg total) by mouth daily., Disp: 30 tablet, Rfl: 3 .  metFORMIN (GLUCOPHAGE) 1000 MG tablet, TAKE 1 TABLET BY MOUTH TWICE DAILY WITH A MEAL, Disp: 60 tablet, Rfl: 4 .  triamcinolone cream (KENALOG) 0.1 %, Apply 1 application topically 2 (two) times daily. (Patient taking differently: Apply 1 application topically 2 (two) times daily as needed. ), Disp: 30 g, Rfl: 0 .  fenofibrate (TRICOR) 145  MG tablet, Take 1 tablet (145 mg total) by mouth daily. (Patient not taking: Reported on 08/22/2018), Disp: 30 tablet, Rfl: 4  Review of Systems  Constitutional: Negative for appetite change, chills, diaphoresis, fatigue, fever and unexpected weight change.  HENT: Negative for congestion, dental problem, drooling, ear pain, facial swelling, hearing loss, mouth sores, sneezing, sore throat, trouble swallowing and voice change.   Eyes: Negative for pain, discharge, redness, itching and visual disturbance.  Respiratory: Negative for cough, choking, shortness of breath and wheezing.   Cardiovascular: Negative for chest pain, palpitations and leg swelling.  Gastrointestinal: Negative for abdominal pain, blood in stool, constipation, diarrhea and vomiting.  Endocrine: Negative for cold intolerance, heat intolerance and polydipsia.  Genitourinary: Negative for decreased urine volume, dysuria and hematuria.  Musculoskeletal: Negative for arthralgias, back pain and gait problem.  Skin: Negative for rash.  Allergic/Immunologic: Negative for environmental allergies.  Neurological: Negative for seizures, syncope, light-headedness and headaches.  Hematological: Negative for adenopathy.  Psychiatric/Behavioral: Negative for agitation, dysphoric mood and suicidal ideas. The patient is not nervous/anxious.     Per HPI unless specifically indicated above     Objective:    BP 132/79 (BP Location: Right Arm, Patient Position: Sitting, Cuff Size: Normal)   Pulse 78   Temp 98.1 F (36.7 C)   Ht 5\' 6"  (1.676 m)  Wt 195 lb 12 oz (88.8 kg)   SpO2 99%   BMI 31.59 kg/m   Wt Readings from Last 3 Encounters:  08/22/18 195 lb 12 oz (88.8 kg)  07/18/18 185 lb (83.9 kg)  05/25/18 195 lb (88.5 kg)    Physical Exam Vitals signs reviewed.  Constitutional:      Appearance: She is well-developed.  HENT:     Head: Normocephalic and atraumatic.  Neck:     Musculoskeletal: Neck supple.  Cardiovascular:      Rate and Rhythm: Normal rate and regular rhythm.  Pulmonary:     Effort: Pulmonary effort is normal.     Breath sounds: Normal breath sounds.  Chest:     Breasts:        Right: Nipple discharge present. No swelling, bleeding, inverted nipple, mass or tenderness.        Left: Nipple discharge present. No swelling, bleeding, inverted nipple, mass or tenderness.  Abdominal:     General: Bowel sounds are normal.     Palpations: Abdomen is soft. There is no mass.     Tenderness: There is no abdominal tenderness.  Lymphadenopathy:     Cervical: No cervical adenopathy.  Skin:    General: Skin is warm and dry.  Neurological:     Mental Status: She is alert and oriented to person, place, and time.  Psychiatric:        Behavior: Behavior normal.     Results for orders placed or performed during the hospital encounter of 08/18/18  Hemoglobin A1c  Result Value Ref Range   Hgb A1c MFr Bld 6.9 (H) 4.8 - 5.6 %   Mean Plasma Glucose 151.33 mg/dL  Lipid panel  Result Value Ref Range   Cholesterol 253 (H) 0 - 200 mg/dL   Triglycerides 832 (H) <150 mg/dL   HDL 34 (L) >40 mg/dL   Total CHOL/HDL Ratio 7.4 RATIO   VLDL UNABLE TO CALCULATE IF TRIGLYCERIDE OVER 400 mg/dL 0 - 40 mg/dL   LDL Cholesterol UNABLE TO CALCULATE IF TRIGLYCERIDE OVER 400 mg/dL 0 - 99 mg/dL  Comprehensive metabolic panel  Result Value Ref Range   Sodium 132 (L) 135 - 145 mmol/L   Potassium 4.0 3.5 - 5.1 mmol/L   Chloride 102 98 - 111 mmol/L   CO2 22 22 - 32 mmol/L   Glucose, Bld 157 (H) 70 - 99 mg/dL   BUN 13 6 - 20 mg/dL   Creatinine, Ser 0.79 0.44 - 1.00 mg/dL   Calcium 8.8 (L) 8.9 - 10.3 mg/dL   Total Protein 7.2 6.5 - 8.1 g/dL   Albumin 4.0 3.5 - 5.0 g/dL   AST 12 (L) 15 - 41 U/L   ALT 17 0 - 44 U/L   Alkaline Phosphatase 58 38 - 126 U/L   Total Bilirubin 0.5 0.3 - 1.2 mg/dL   GFR calc non Af Amer >60 >60 mL/min   GFR calc Af Amer >60 >60 mL/min   Anion gap 8 5 - 15      Assessment & Plan:   Encounter  Diagnoses  Name Primary?  . Controlled diabetes mellitus type 2 with complications, unspecified whether long term insulin use ([place]) Yes  . Hyperlipidemia, unspecified hyperlipidemia type   . Essential hypertension   . Tobacco use disorder   . Nipple discharge in female     -reviewed labs with pt -will send for diagnostic mammogram -pt to Continue current medications and get on tricor.  counseled on lowfat diet.  Discussed with pt risks for pancreatitis on januvia with elevated triglycerides.  Will discontinue the januvia at next OV if not improved.   -counseled on smoking cessation -pt to follow up 3 months.  RTO sooner prn

## 2018-08-24 ENCOUNTER — Encounter: Payer: Self-pay | Admitting: Physician Assistant

## 2018-08-24 ENCOUNTER — Other Ambulatory Visit (HOSPITAL_COMMUNITY): Payer: Self-pay | Admitting: *Deleted

## 2018-08-24 DIAGNOSIS — N6452 Nipple discharge: Secondary | ICD-10-CM

## 2018-08-24 DIAGNOSIS — N6320 Unspecified lump in the left breast, unspecified quadrant: Secondary | ICD-10-CM

## 2018-08-24 NOTE — Progress Notes (Addendum)
LPN sent staff message on 08-24-18 to Etheleen Sia, [facility] at [facility] regarding pt's need for dx mammo due to bilateral nipple drainage.

## 2018-09-05 ENCOUNTER — Ambulatory Visit: Payer: Self-pay | Admitting: Physician Assistant

## 2018-09-05 ENCOUNTER — Encounter: Payer: Self-pay | Admitting: Physician Assistant

## 2018-09-05 VITALS — BP 144/80 | HR 75 | Temp 98.1°F | Wt 204.5 lb

## 2018-09-05 DIAGNOSIS — M5441 Lumbago with sciatica, right side: Secondary | ICD-10-CM

## 2018-09-05 MED ORDER — CYCLOBENZAPRINE HCL 10 MG PO TABS: 10.0000 mg | ORAL_TABLET | Freq: Three times a day (TID) | ORAL | 0 refills | Status: DC | PRN

## 2018-09-05 MED ORDER — PREDNISONE 10 MG PO TABS: ORAL_TABLET | ORAL | 0 refills | Status: DC

## 2018-09-05 NOTE — Progress Notes (Signed)
BP (!) 144/80   Pulse 75   Temp 98.1 F (36.7 C)   Wt 204 lb 8 oz (92.8 kg)   SpO2 97%   BMI 33.01 kg/m    Subjective:    Patient ID: Shari Collins, female    DOB: 11-Feb-1976, 42 y.o.   MRN: [id number]  HPI: Shari Collins is a 42 y.o. female presenting on 09/05/2018 for Back Pain (sx began 08-30-18. lower right back pain radiates down to R foot. pt states she can see muscle spasms on her R ankle. pt has taken IBU to help with sx but does not help)   HPI  Chief Complaint  Patient presents with  . Back Pain    sx began 08-30-18. lower right back pain radiates down to R foot. pt states she can see muscle spasms on her R ankle. pt has taken IBU to help with sx but does not help    Pt states she was at work when it started.  She picks up lots of heavy things at work [employer]).  She says she has had problems with sciatica in the past.  Relevant past medical, surgical, family and social history reviewed and updated as indicated. Interim medical history since our last visit reviewed. Allergies and medications reviewed and updated.  Review of Systems  Constitutional: Negative for appetite change, chills, diaphoresis, fatigue, fever and unexpected weight change.  HENT: Negative for congestion, dental problem, drooling, ear pain, facial swelling, hearing loss, mouth sores, sneezing, sore throat, trouble swallowing and voice change.   Eyes: Negative for pain, discharge, redness, itching and visual disturbance.  Respiratory: Negative for cough, choking, shortness of breath and wheezing.   Cardiovascular: Negative for chest pain, palpitations and leg swelling.  Gastrointestinal: Negative for abdominal pain, blood in stool, constipation, diarrhea and vomiting.  Endocrine: Negative for cold intolerance, heat intolerance and polydipsia.  Genitourinary: Negative for decreased urine volume, dysuria and hematuria.  Musculoskeletal: Positive for back pain and gait problem. Negative for  arthralgias.  Skin: Negative for rash.  Allergic/Immunologic: Negative for environmental allergies.  Neurological: Negative for seizures, syncope, light-headedness and headaches.  Hematological: Negative for adenopathy.  Psychiatric/Behavioral: Negative for agitation, dysphoric mood and suicidal ideas. The patient is not nervous/anxious.     Per HPI unless specifically indicated above     Objective:    BP (!) 144/80   Pulse 75   Temp 98.1 F (36.7 C)   Wt 204 lb 8 oz (92.8 kg)   SpO2 97%   BMI 33.01 kg/m   Wt Readings from Last 3 Encounters:  09/05/18 204 lb 8 oz (92.8 kg)  08/22/18 195 lb 12 oz (88.8 kg)  07/18/18 185 lb (83.9 kg)    Physical Exam Vitals signs and nursing note reviewed.  Constitutional:      Appearance: Normal appearance.  HENT:     Head: Normocephalic and atraumatic.  Neck:     Musculoskeletal: Neck supple.  Cardiovascular:     Rate and Rhythm: Normal rate and regular rhythm.  Pulmonary:     Effort: Pulmonary effort is normal. No respiratory distress.     Breath sounds: Normal breath sounds.  Abdominal:     General: Bowel sounds are normal. There is no distension.     Palpations: Abdomen is soft.     Tenderness: There is no abdominal tenderness.  Musculoskeletal:     Lumbar back: She exhibits pain. She exhibits normal range of motion and no tenderness.  Comments: No LE edema.  + SLR on the R at 45 degrees.  Good pulses and sensation RLE  Skin:    General: Skin is warm and dry.  Neurological:     General: No focal deficit present.     Mental Status: She is alert and oriented to person, place, and time.  Psychiatric:        Mood and Affect: Mood normal.        Behavior: Behavior normal.         Assessment & Plan:    Encounter Diagnosis  Name Primary?  . Right-sided low back pain with right-sided sciatica, unspecified chronicity Yes   -rx flexeril.  Pt to use ice/heat and avoid overextion.  Pt counseled to avoid driving on flexeril  due to sedation -pt to start prednisone taper if not improving with flexeril in 24-48 hours.   Discussed with pt risks/benefits of prednisone.  Discussed that she will need to monitor her bs if she takes the prednisone and will need to contact office if fbs > 300 -pt to follow up as scheduled.  RTO sooner prn

## 2018-09-13 ENCOUNTER — Ambulatory Visit (HOSPITAL_COMMUNITY): Payer: Self-pay

## 2018-09-13 ENCOUNTER — Other Ambulatory Visit: Payer: Self-pay

## 2018-09-14 ENCOUNTER — Encounter: Payer: Self-pay | Admitting: Physician Assistant

## 2018-09-14 DIAGNOSIS — E11319 Type 2 diabetes mellitus with unspecified diabetic retinopathy without macular edema: Secondary | ICD-10-CM | POA: Insufficient documentation

## 2018-09-22 ENCOUNTER — Ambulatory Visit: Payer: Self-pay | Admitting: Physician Assistant

## 2018-09-22 ENCOUNTER — Encounter: Payer: Self-pay | Admitting: Physician Assistant

## 2018-09-26 ENCOUNTER — Ambulatory Visit: Payer: Self-pay | Admitting: Physician Assistant

## 2018-10-13 ENCOUNTER — Encounter: Payer: Self-pay | Admitting: Physician Assistant

## 2018-11-03 ENCOUNTER — Encounter: Payer: Self-pay | Admitting: Physician Assistant

## 2018-11-03 ENCOUNTER — Ambulatory Visit: Payer: Self-pay | Admitting: Physician Assistant

## 2018-11-03 VITALS — BP 124/83 | HR 72 | Temp 97.9°F | Ht 66.0 in | Wt 200.5 lb

## 2018-11-03 DIAGNOSIS — J069 Acute upper respiratory infection, unspecified: Secondary | ICD-10-CM

## 2018-11-03 MED ORDER — BENZONATATE 100 MG PO CAPS: ORAL_CAPSULE | ORAL | 0 refills | Status: DC

## 2018-11-03 NOTE — Patient Instructions (Signed)
Upper Respiratory Infection, Adult An upper respiratory infection (URI) is a common viral infection of the nose, throat, and upper air passages that lead to the lungs. The most common type of URI is the common cold. URIs usually get better on their own, without medical treatment. What are the causes? A URI is caused by a virus. You may catch a virus by:  Breathing in droplets from an infected person's cough or sneeze.  Touching something that has been exposed to the virus (contaminated) and then touching your mouth, nose, or eyes. What increases the risk? You are more likely to get a URI if:  You are very young or very old.  It is autumn or winter.  You have close contact with others, such as at a daycare, school, or health care facility.  You smoke.  You have long-term (chronic) heart or lung disease.  You have a weakened disease-fighting (immune) system.  You have nasal allergies or asthma.  You are experiencing a lot of stress.  You work in an area that has poor air circulation.  You have poor nutrition. What are the signs or symptoms? A URI usually involves some of the following symptoms:  Runny or stuffy (congested) nose.  Sneezing.  Cough.  Sore throat.  Headache.  Fatigue.  Fever.  Loss of appetite.  Pain in your forehead, behind your eyes, and over your cheekbones (sinus pain).  Muscle aches.  Redness or irritation of the eyes.  Pressure in the ears or face. How is this diagnosed? This condition may be diagnosed based on your medical history and symptoms, and a physical exam. Your health care provider may use a cotton swab to take a mucus sample from your nose (nasal swab). This sample can be tested to determine what virus is causing the illness. How is this treated? URIs usually get better on their own within 7-10 days. You can take steps at home to relieve your symptoms. Medicines cannot cure URIs, but your health care provider may recommend  certain medicines to help relieve symptoms, such as:  Over-the-counter cold medicines.  Cough suppressants. Coughing is a type of defense against infection that helps to clear the respiratory system, so take these medicines only as recommended by your health care provider.  Fever-reducing medicines. Follow these instructions at home: Activity  Rest as needed.  If you have a fever, stay home from work or school until your fever is gone or until your health care provider says you are no longer contagious. Your health care provider may have you wear a face mask to prevent your infection from spreading. Relieving symptoms  Gargle with a salt-water mixture 3-4 times a day or as needed. To make a salt-water mixture, completely dissolve -1 tsp of salt in 1 cup of warm water.  Use a cool-mist humidifier to add moisture to the air. This can help you breathe more easily. Eating and drinking   Drink enough fluid to keep your urine pale yellow.  Eat soups and other clear broths. General instructions   Take over-the-counter and prescription medicines only as told by your health care provider. These include cold medicines, fever reducers, and cough suppressants.  Do not use any products that contain nicotine or tobacco, such as cigarettes and e-cigarettes. If you need help quitting, ask your health care provider.  Stay away from secondhand smoke.  Stay up to date on all immunizations, including the yearly (annual) flu vaccine.  Keep all follow-up visits as told by your health   care provider. This is important. How to prevent the spread of infection to others   URIs can be passed from person to person (are contagious). To prevent the infection from spreading: ? Wash your hands often with soap and water. If soap and water are not available, use hand sanitizer. ? Avoid touching your mouth, face, eyes, or nose. ? Cough or sneeze into a tissue or your sleeve or elbow instead of into your hand  or into the air. Contact a health care provider if:  You are getting worse instead of better.  You have a fever or chills.  Your mucus is brown or red.  You have yellow or brown discharge coming from your nose.  You have pain in your face, especially when you bend forward.  You have swollen neck glands.  You have pain while swallowing.  You have white areas in the back of your throat. Get help right away if:  You have shortness of breath that gets worse.  You have severe or persistent: ? Headache. ? Ear pain. ? Sinus pain. ? Chest pain.  You have chronic lung disease along with any of the following: ? Wheezing. ? Prolonged cough. ? Coughing up blood. ? A change in your usual mucus.  You have a stiff neck.  You have changes in your: ? Vision. ? Hearing. ? Thinking. ? Mood. Summary  An upper respiratory infection (URI) is a common infection of the nose, throat, and upper air passages that lead to the lungs.  A URI is caused by a virus.  URIs usually get better on their own within 7-10 days.  Medicines cannot cure URIs, but your health care provider may recommend certain medicines to help relieve symptoms. This information is not intended to replace advice given to you by your health care provider. Make sure you discuss any questions you have with your health care provider. Document Released: 02/17/2001 Document Revised: 04/09/2017 Document Reviewed: 04/09/2017 Elsevier Interactive Patient Education  2019 [employer] Inc[employer]    

## 2018-11-03 NOTE — Progress Notes (Signed)
BP 124/83 (BP Location: Right Arm, Patient Position: Sitting, Cuff Size: Normal)   Pulse 72   Temp 97.9 F (36.6 C)   Ht 5\' 6"  (1.676 m)   Wt 200 lb 8 oz (90.9 kg)   SpO2 100%   BMI 32.36 kg/m    Subjective:    Patient ID: Shari Collins, female    DOB: 1976/08/02, 43 y.o.   MRN: [id number]  HPI: Shari Collins is a 43 y.o. female presenting on 11/03/2018 for Sore Throat (runny nose, cough, itchy sore throat, grean thick mucous, HA. sx began on Monday 10-31-18. pt has been takin dayquil and nyquil which have not helped)   HPI   Chief Complaint  Patient presents with  . Sore Throat    runny nose, cough, itchy sore throat, grean thick mucous, HA. sx began on Monday 10-31-18. pt has been takin dayquil and nyquil which have not helped     Relevant past medical, surgical, family and social history reviewed and updated as indicated. Interim medical history since our last visit reviewed. Allergies and medications reviewed and updated.   Current Outpatient Medications:  .  albuterol (PROVENTIL HFA;VENTOLIN HFA) 108 (90 Base) MCG/ACT inhaler, Inhale 2 puffs into the lungs every 6 (six) hours as needed for wheezing or shortness of breath., Disp: 1 Inhaler, Rfl: 2 .  cyclobenzaprine (FLEXERIL) 10 MG tablet, Take 1 tablet (10 mg total) by mouth 3 (three) times daily as needed for muscle spasms., Disp: 30 tablet, Rfl: 0 .  fenofibrate (TRICOR) 145 MG tablet, Take 1 tablet (145 mg total) by mouth daily., Disp: 30 tablet, Rfl: 4 .  Insulin Glargine (LANTUS SOLOSTAR) 100 UNIT/ML Solostar Pen, Inject 10 Units into the skin at bedtime. (Patient taking differently: Inject 40 Units into the skin at bedtime. ), Disp: 1 pen, Rfl: 0 .  JANUVIA 100 MG tablet, TAKE 1 Tablet BY MOUTH ONCE DAILY, Disp: 90 tablet, Rfl: 1 .  lisinopril (PRINIVIL,ZESTRIL) 10 MG tablet, Take 1 tablet (10 mg total) by mouth daily., Disp: 30 tablet, Rfl: 3 .  metFORMIN (GLUCOPHAGE) 1000 MG tablet, TAKE 1 TABLET BY MOUTH TWICE  DAILY WITH A MEAL, Disp: 60 tablet, Rfl: 4 .  triamcinolone cream (KENALOG) 0.1 %, Apply 1 application topically 2 (two) times daily. (Patient taking differently: Apply 1 application topically 2 (two) times daily as needed. ), Disp: 30 g, Rfl: 0    Review of Systems  Constitutional: Negative for appetite change, chills, diaphoresis, fatigue, fever and unexpected weight change.  HENT: Positive for sneezing, sore throat and trouble swallowing. Negative for congestion, dental problem, drooling, ear pain, facial swelling, hearing loss, mouth sores and voice change.   Eyes: Negative for pain, discharge, redness, itching and visual disturbance.  Respiratory: Positive for cough. Negative for choking, shortness of breath and wheezing.   Cardiovascular: Negative for chest pain, palpitations and leg swelling.  Gastrointestinal: Negative for abdominal pain, blood in stool, constipation, diarrhea and vomiting.  Endocrine: Negative for cold intolerance, heat intolerance and polydipsia.  Genitourinary: Negative for decreased urine volume, dysuria and hematuria.  Musculoskeletal: Negative for arthralgias, back pain and gait problem.  Skin: Negative for rash.  Allergic/Immunologic: Negative for environmental allergies.  Neurological: Negative for seizures, syncope, light-headedness and headaches.  Hematological: Negative for adenopathy.  Psychiatric/Behavioral: Negative for agitation, dysphoric mood and suicidal ideas. The patient is not nervous/anxious.     Per HPI unless specifically indicated above     Objective:    BP 124/83 (BP Location: Right  Arm, Patient Position: Sitting, Cuff Size: Normal)   Pulse 72   Temp 97.9 F (36.6 C)   Ht 5\' 6"  (1.676 m)   Wt 200 lb 8 oz (90.9 kg)   SpO2 100%   BMI 32.36 kg/m   Wt Readings from Last 3 Encounters:  11/03/18 200 lb 8 oz (90.9 kg)  09/05/18 204 lb 8 oz (92.8 kg)  08/22/18 195 lb 12 oz (88.8 kg)    Physical Exam Vitals signs reviewed.   Constitutional:      Appearance: She is well-developed.  HENT:     Head: Normocephalic and atraumatic.     Right Ear: Hearing, tympanic membrane, ear canal and external ear normal.     Left Ear: Hearing, tympanic membrane, ear canal and external ear normal.     Nose: Nose normal.     Mouth/Throat:     Pharynx: Uvula midline. No oropharyngeal exudate.  Neck:     Musculoskeletal: Neck supple.  Cardiovascular:     Rate and Rhythm: Normal rate and regular rhythm.  Pulmonary:     Effort: Pulmonary effort is normal.     Breath sounds: Normal breath sounds. No wheezing.  Lymphadenopathy:     Cervical: No cervical adenopathy.  Skin:    General: Skin is warm and dry.  Neurological:     Mental Status: She is alert and oriented to person, place, and time.  Psychiatric:        Behavior: Behavior normal.         Assessment & Plan:    Encounter Diagnosis  Name Primary?  Marland Kitchen Upper respiratory tract infection, unspecified type Yes    -pt counseled on rest, fluids, avoid smoking, OTCs as needed.  She is given rx for tessalon. -pt to follow up as scheduled.  RTO sooner for new symptoms or if persists

## 2018-11-14 ENCOUNTER — Other Ambulatory Visit (HOSPITAL_COMMUNITY)
Admission: RE | Admit: 2018-11-14 | Discharge: 2018-11-14 | Disposition: A | Discharge status: Home / Self Care | Payer: Self-pay | Source: Ambulatory Visit | Attending: Physician Assistant | Admitting: Physician Assistant

## 2018-11-14 DIAGNOSIS — E785 Hyperlipidemia, unspecified: Secondary | ICD-10-CM | POA: Insufficient documentation

## 2018-11-14 DIAGNOSIS — E118 Type 2 diabetes mellitus with unspecified complications: Secondary | ICD-10-CM | POA: Insufficient documentation

## 2018-11-14 DIAGNOSIS — I10 Essential (primary) hypertension: Secondary | ICD-10-CM | POA: Insufficient documentation

## 2018-11-14 LAB — HEMOGLOBIN A1C
Hgb A1c MFr Bld: 6.7 % — ABNORMAL HIGH (ref 4.8–5.6)
Mean Plasma Glucose: 145.59 mg/dL

## 2018-11-14 LAB — COMPREHENSIVE METABOLIC PANEL
ALT: 15 U/L (ref 0–44)
ANION GAP: 8 (ref 5–15)
AST: 13 U/L — ABNORMAL LOW (ref 15–41)
Albumin: 4 g/dL (ref 3.5–5.0)
Alkaline Phosphatase: 42 U/L (ref 38–126)
BILIRUBIN TOTAL: 0.4 mg/dL (ref 0.3–1.2)
BUN: 13 mg/dL (ref 6–20)
CO2: 24 mmol/L (ref 22–32)
Calcium: 8.8 mg/dL — ABNORMAL LOW (ref 8.9–10.3)
Chloride: 105 mmol/L (ref 98–111)
Creatinine, Ser: 0.66 mg/dL (ref 0.44–1.00)
GFR calc Af Amer: >60 mL/min (ref >60)
GFR calc non Af Amer: >60 mL/min (ref >60)
Glucose, Bld: 108 mg/dL — ABNORMAL HIGH (ref 70–99)
POTASSIUM: 4.3 mmol/L (ref 3.5–5.1)
Sodium: 137 mmol/L (ref 135–145)
TOTAL PROTEIN: 6.9 g/dL (ref 6.5–8.1)

## 2018-11-14 LAB — LIPID PANEL
CHOL/HDL RATIO: 4.7 ratio
CHOLESTEROL: 197 mg/dL (ref 0–200)
HDL: 42 mg/dL (ref >40)
LDL Cholesterol: 111 mg/dL — ABNORMAL HIGH (ref 0–99)
TRIGLYCERIDES: 221 mg/dL — AB (ref <150)
VLDL: 44 mg/dL — AB (ref 0–40)

## 2018-11-21 ENCOUNTER — Other Ambulatory Visit: Payer: Self-pay

## 2018-11-21 ENCOUNTER — Ambulatory Visit: Payer: Self-pay | Admitting: Physician Assistant

## 2018-11-21 ENCOUNTER — Encounter: Payer: Self-pay | Admitting: Physician Assistant

## 2018-11-21 VITALS — BP 140/82 | HR 50 | Temp 97.9°F

## 2018-11-21 DIAGNOSIS — I10 Essential (primary) hypertension: Secondary | ICD-10-CM

## 2018-11-21 DIAGNOSIS — F17200 Nicotine dependence, unspecified, uncomplicated: Secondary | ICD-10-CM

## 2018-11-21 DIAGNOSIS — E785 Hyperlipidemia, unspecified: Secondary | ICD-10-CM

## 2018-11-21 DIAGNOSIS — E118 Type 2 diabetes mellitus with unspecified complications: Secondary | ICD-10-CM

## 2018-11-21 NOTE — Progress Notes (Signed)
BP 140/82 (BP Location: Left Arm, Patient Position: Sitting, Cuff Size: Normal)   Pulse (!) 50   Temp 97.9 F (36.6 C)   SpO2 97%    Subjective:    Patient ID: Shari Collins, female    DOB: 13-Nov-1975, 43 y.o.   MRN: [id number]  HPI: Shari Collins is a 43 y.o. female presenting on 11/21/2018 for Diabetes and Hyperlipidemia   HPI  Relevant past medical, surgical, family and social history reviewed and updated as indicated. Interim medical history since our last visit reviewed. Allergies and medications reviewed and updated.    Current Outpatient Medications:  .  albuterol (PROVENTIL HFA;VENTOLIN HFA) 108 (90 Base) MCG/ACT inhaler, Inhale 2 puffs into the lungs every 6 (six) hours as needed for wheezing or shortness of breath., Disp: 1 Inhaler, Rfl: 2 .  cyclobenzaprine (FLEXERIL) 10 MG tablet, Take 1 tablet (10 mg total) by mouth 3 (three) times daily as needed for muscle spasms., Disp: 30 tablet, Rfl: 0 .  fenofibrate (TRICOR) 145 MG tablet, Take 1 tablet (145 mg total) by mouth daily., Disp: 30 tablet, Rfl: 4 .  Insulin Glargine (LANTUS SOLOSTAR) 100 UNIT/ML Solostar Pen, Inject 10 Units into the skin at bedtime. (Patient taking differently: Inject 35 Units into the skin at bedtime. ), Disp: 1 pen, Rfl: 0 .  JANUVIA 100 MG tablet, TAKE 1 Tablet BY MOUTH ONCE DAILY, Disp: 90 tablet, Rfl: 1 .  lisinopril (PRINIVIL,ZESTRIL) 10 MG tablet, Take 1 tablet (10 mg total) by mouth daily., Disp: 30 tablet, Rfl: 3 .  metFORMIN (GLUCOPHAGE) 1000 MG tablet, TAKE 1 TABLET BY MOUTH TWICE DAILY WITH A MEAL, Disp: 60 tablet, Rfl: 4 .  triamcinolone cream (KENALOG) 0.1 %, Apply 1 application topically 2 (two) times daily. (Patient taking differently: Apply 1 application topically 2 (two) times daily as needed. ), Disp: 30 g, Rfl: 0   Review of Systems  Constitutional: Negative for appetite change, chills, diaphoresis, fatigue, fever and unexpected weight change.  HENT: Negative for congestion,  dental problem, drooling, ear pain, facial swelling, hearing loss, mouth sores, sneezing, sore throat, trouble swallowing and voice change.   Eyes: Negative for pain, discharge, redness, itching and visual disturbance.  Respiratory: Negative for cough, choking, shortness of breath and wheezing.   Cardiovascular: Negative for chest pain, palpitations and leg swelling.  Gastrointestinal: Negative for abdominal pain, blood in stool, constipation, diarrhea and vomiting.  Endocrine: Negative for cold intolerance, heat intolerance and polydipsia.  Genitourinary: Negative for decreased urine volume, dysuria and hematuria.  Musculoskeletal: Negative for arthralgias, back pain and gait problem.  Skin: Negative for rash.  Allergic/Immunologic: Negative for environmental allergies.  Neurological: Negative for seizures, syncope, light-headedness and headaches.  Hematological: Negative for adenopathy.  Psychiatric/Behavioral: Negative for agitation, dysphoric mood and suicidal ideas. The patient is not nervous/anxious.     Per HPI unless specifically indicated above     Objective:    BP 140/82 (BP Location: Left Arm, Patient Position: Sitting, Cuff Size: Normal)   Pulse (!) 50   Temp 97.9 F (36.6 C)   SpO2 97%   Wt Readings from Last 3 Encounters:  11/03/18 200 lb 8 oz (90.9 kg)  09/05/18 204 lb 8 oz (92.8 kg)  08/22/18 195 lb 12 oz (88.8 kg)    Physical Exam Vitals signs reviewed.  Constitutional:      Appearance: She is well-developed.  HENT:     Head: Normocephalic and atraumatic.  Neck:     Musculoskeletal: Neck supple.  Cardiovascular:     Rate and Rhythm: Normal rate and regular rhythm.  Pulmonary:     Effort: Pulmonary effort is normal.     Breath sounds: Normal breath sounds.  Abdominal:     General: Bowel sounds are normal.     Palpations: Abdomen is soft. There is no mass.     Tenderness: There is no abdominal tenderness.  Lymphadenopathy:     Cervical: No cervical  adenopathy.  Skin:    General: Skin is warm and dry.  Neurological:     Mental Status: She is alert and oriented to person, place, and time.  Psychiatric:        Behavior: Behavior normal.     Results for orders placed or performed during the hospital encounter of 11/14/18  Lipid panel  Result Value Ref Range   Cholesterol 197 0 - 200 mg/dL   Triglycerides 221 (H) <150 mg/dL   HDL 42 >40 mg/dL   Total CHOL/HDL Ratio 4.7 RATIO   VLDL 44 (H) 0 - 40 mg/dL   LDL Cholesterol 111 (H) 0 - 99 mg/dL  Comprehensive metabolic panel  Result Value Ref Range   Sodium 137 135 - 145 mmol/L   Potassium 4.3 3.5 - 5.1 mmol/L   Chloride 105 98 - 111 mmol/L   CO2 24 22 - 32 mmol/L   Glucose, Bld 108 (H) 70 - 99 mg/dL   BUN 13 6 - 20 mg/dL   Creatinine, Ser 0.66 0.44 - 1.00 mg/dL   Calcium 8.8 (L) 8.9 - 10.3 mg/dL   Total Protein 6.9 6.5 - 8.1 g/dL   Albumin 4.0 3.5 - 5.0 g/dL   AST 13 (L) 15 - 41 U/L   ALT 15 0 - 44 U/L   Alkaline Phosphatase 42 38 - 126 U/L   Total Bilirubin 0.4 0.3 - 1.2 mg/dL   GFR calc non Af Amer >60 >60 mL/min   GFR calc Af Amer >60 >60 mL/min   Anion gap 8 5 - 15  Hemoglobin A1c  Result Value Ref Range   Hgb A1c MFr Bld 6.7 (H) 4.8 - 5.6 %   Mean Plasma Glucose 145.59 mg/dL      Assessment & Plan:    Encounter Diagnoses  Name Primary?  . Hyperlipidemia, unspecified hyperlipidemia type Yes  . Essential hypertension   . Controlled diabetes mellitus type 2 with complications, unspecified whether long term insulin use ([place])   . Tobacco use disorder     -reviewed labs iwht pt -No changes today/ pt to continue current medications -pt counseled to Watch lowfat -Counseled smoking cessation -pt to follow up 3 months.  RTO sooner prn

## 2018-11-24 ENCOUNTER — Telehealth: Payer: Self-pay | Admitting: Physician Assistant

## 2018-12-15 ENCOUNTER — Other Ambulatory Visit: Payer: Self-pay | Admitting: Physician Assistant

## 2018-12-15 ENCOUNTER — Telehealth: Payer: Self-pay | Admitting: Student

## 2018-12-15 MED ORDER — FLUCONAZOLE 150 MG PO TABS: 150.0000 mg | ORAL_TABLET | Freq: Once | ORAL | 0 refills | Status: AC

## 2018-12-15 NOTE — Telephone Encounter (Signed)
Pt called c/o having yeast infection and wants to know if a prescription can be sent to local pharmacy or if she needed an appointment. Pt states sx are vaginal itching with no discharge which started about 5 days ago (12-10-18). Pt states she has been using monistat for 5 days which hasn't been helping.  LPN explained to patient that PA was out of the office this week, but LPN will send PA a message regarding this. LPN will call pt back after PA's response. Pt verbalized understanding.  PA states she will send rx to [facility] in [place] later today. LPN called patient and left voicemail on 12-15-18 at 2:43pm notifying her of this.

## 2018-12-22 ENCOUNTER — Other Ambulatory Visit: Payer: Self-pay | Admitting: Physician Assistant

## 2019-01-05 ENCOUNTER — Telehealth: Payer: Self-pay | Admitting: Student

## 2019-01-05 NOTE — Telephone Encounter (Signed)
LPN explained to patient that she has received proper treatment for a yeast infection; if sx continued after treatment then it most likely is not a yeast infection. LPN recommended patient to seek STD screening due to her persisting vaginal itching as [facility] does not test for STDs. Pt verbalized understanding and states she has phone number for [facility] and will give them a call.

## 2019-02-20 ENCOUNTER — Other Ambulatory Visit (HOSPITAL_COMMUNITY)
Admission: RE | Admit: 2019-02-20 | Discharge: 2019-02-20 | Disposition: A | Discharge status: Home / Self Care | Payer: Self-pay | Source: Ambulatory Visit | Attending: Physician Assistant | Admitting: Physician Assistant

## 2019-02-20 DIAGNOSIS — E785 Hyperlipidemia, unspecified: Secondary | ICD-10-CM | POA: Insufficient documentation

## 2019-02-20 DIAGNOSIS — E118 Type 2 diabetes mellitus with unspecified complications: Secondary | ICD-10-CM | POA: Insufficient documentation

## 2019-02-20 DIAGNOSIS — I10 Essential (primary) hypertension: Secondary | ICD-10-CM | POA: Insufficient documentation

## 2019-02-20 LAB — COMPREHENSIVE METABOLIC PANEL
ALT: 17 U/L (ref 0–44)
AST: 21 U/L (ref 15–41)
Albumin: 4.2 g/dL (ref 3.5–5.0)
Alkaline Phosphatase: 39 U/L (ref 38–126)
Anion gap: 10 (ref 5–15)
BUN: 16 mg/dL (ref 6–20)
CO2: 22 mmol/L (ref 22–32)
Calcium: 9.1 mg/dL (ref 8.9–10.3)
Chloride: 103 mmol/L (ref 98–111)
Creatinine, Ser: 0.74 mg/dL (ref 0.44–1.00)
GFR calc Af Amer: >60 mL/min (ref >60)
GFR calc non Af Amer: >60 mL/min (ref >60)
Glucose, Bld: 110 mg/dL — ABNORMAL HIGH (ref 70–99)
Potassium: 4.1 mmol/L (ref 3.5–5.1)
Sodium: 135 mmol/L (ref 135–145)
Total Bilirubin: 0.7 mg/dL (ref 0.3–1.2)
Total Protein: 7.5 g/dL (ref 6.5–8.1)

## 2019-02-20 LAB — HEMOGLOBIN A1C
Hgb A1c MFr Bld: 7 % — ABNORMAL HIGH (ref 4.8–5.6)
Mean Plasma Glucose: 154.2 mg/dL

## 2019-02-20 LAB — LIPID PANEL
Cholesterol: 180 mg/dL (ref 0–200)
HDL: 41 mg/dL (ref >40)
LDL Cholesterol: 104 mg/dL — ABNORMAL HIGH (ref 0–99)
Total CHOL/HDL Ratio: 4.4 RATIO
Triglycerides: 175 mg/dL — ABNORMAL HIGH (ref <150)
VLDL: 35 mg/dL (ref 0–40)

## 2019-02-21 ENCOUNTER — Ambulatory Visit: Payer: Self-pay | Admitting: Physician Assistant

## 2019-02-28 ENCOUNTER — Ambulatory Visit: Payer: Self-pay | Admitting: Physician Assistant

## 2019-02-28 DIAGNOSIS — I10 Essential (primary) hypertension: Secondary | ICD-10-CM

## 2019-02-28 DIAGNOSIS — E785 Hyperlipidemia, unspecified: Secondary | ICD-10-CM

## 2019-02-28 DIAGNOSIS — F17200 Nicotine dependence, unspecified, uncomplicated: Secondary | ICD-10-CM

## 2019-02-28 DIAGNOSIS — E118 Type 2 diabetes mellitus with unspecified complications: Secondary | ICD-10-CM

## 2019-02-28 NOTE — Progress Notes (Signed)
There were no vitals taken for this visit.   Subjective:    Patient ID: Shari Collins, female    DOB: 1975/11/11, 43 y.o.   MRN: [id number]  HPI: Shari Collins is a 43 y.o. female presenting on 02/28/2019 for No chief complaint on file.   HPI   This is a telemedicine visit through [employer] due to coronavirus pandemic  I connected with  Shari Collins on 02/28/19 by a video enabled telemedicine application and verified that I am speaking with the correct person using two identifiers.   I discussed the limitations of evaluation and management by telemedicine. The patient expressed understanding and agreed to proceed.  Pt is at work at [employer].  Provider is at office.   Pt says she is doing well and she has no complaints.  She wears a mask while at work because it is required.    Relevant past medical, surgical, family and social history reviewed and updated as indicated. Interim medical history since our last visit reviewed. Allergies and medications reviewed and updated.   Current Outpatient Medications:  .  albuterol (PROVENTIL HFA;VENTOLIN HFA) 108 (90 Base) MCG/ACT inhaler, Inhale 2 puffs into the lungs every 6 (six) hours as needed for wheezing or shortness of breath., Disp: 1 Inhaler, Rfl: 2 .  cyclobenzaprine (FLEXERIL) 10 MG tablet, Take 1 tablet (10 mg total) by mouth 3 (three) times daily as needed for muscle spasms., Disp: 30 tablet, Rfl: 0 .  fenofibrate (TRICOR) 145 MG tablet, Take 1 tablet (145 mg total) by mouth daily., Disp: 30 tablet, Rfl: 4 .  Insulin Glargine (LANTUS SOLOSTAR) 100 UNIT/ML Solostar Pen, Inject 10 Units into the skin at bedtime. (Patient taking differently: Inject 35 Units into the skin at bedtime. ), Disp: 1 pen, Rfl: 0 .  lisinopril (PRINIVIL,ZESTRIL) 10 MG tablet, Take 1 tablet by mouth once daily, Disp: 30 tablet, Rfl: 4 .  metFORMIN (GLUCOPHAGE) 1000 MG tablet, TAKE 1 TABLET BY MOUTH TWICE DAILY WITH A MEAL, Disp: 60 tablet, Rfl: 4 .   JANUVIA 100 MG tablet, TAKE 1 Tablet BY MOUTH ONCE DAILY (Patient not taking: Reported on 02/28/2019), Disp: 90 tablet, Rfl: 1 .  triamcinolone cream (KENALOG) 0.1 %, Apply 1 application topically 2 (two) times daily. (Patient not taking: Reported on 02/28/2019), Disp: 30 g, Rfl: 0    Review of Systems  Per HPI unless specifically indicated above     Objective:    There were no vitals taken for this visit.  Wt Readings from Last 3 Encounters:  11/03/18 200 lb 8 oz (90.9 kg)  09/05/18 204 lb 8 oz (92.8 kg)  08/22/18 195 lb 12 oz (88.8 kg)    Physical Exam Constitutional:      General: She is not in acute distress.    Appearance: Normal appearance. She is not ill-appearing.  HENT:     Head: Normocephalic and atraumatic.  Pulmonary:     Effort: Pulmonary effort is normal. No respiratory distress.  Neurological:     Mental Status: She is alert and oriented to person, place, and time.  Psychiatric:        Attention and Perception: Attention normal.        Mood and Affect: Mood normal.        Speech: Speech normal.        Behavior: Behavior is cooperative.        Cognition and Memory: Cognition normal.     Results for orders placed or performed  during the hospital encounter of 02/20/19  Hemoglobin A1c  Result Value Ref Range   Hgb A1c MFr Bld 7.0 (H) 4.8 - 5.6 %   Mean Plasma Glucose 154.2 mg/dL  Lipid panel  Result Value Ref Range   Cholesterol 180 0 - 200 mg/dL   Triglycerides 175 (H) <150 mg/dL   HDL 41 >40 mg/dL   Total CHOL/HDL Ratio 4.4 RATIO   VLDL 35 0 - 40 mg/dL   LDL Cholesterol 104 (H) 0 - 99 mg/dL  Comprehensive metabolic panel  Result Value Ref Range   Sodium 135 135 - 145 mmol/L   Potassium 4.1 3.5 - 5.1 mmol/L   Chloride 103 98 - 111 mmol/L   CO2 22 22 - 32 mmol/L   Glucose, Bld 110 (H) 70 - 99 mg/dL   BUN 16 6 - 20 mg/dL   Creatinine, Ser 0.74 0.44 - 1.00 mg/dL   Calcium 9.1 8.9 - 10.3 mg/dL   Total Protein 7.5 6.5 - 8.1 g/dL   Albumin 4.2 3.5 -  5.0 g/dL   AST 21 15 - 41 U/L   ALT 17 0 - 44 U/L   Alkaline Phosphatase 39 38 - 126 U/L   Total Bilirubin 0.7 0.3 - 1.2 mg/dL   GFR calc non Af Amer >60 >60 mL/min   GFR calc Af Amer >60 >60 mL/min   Anion gap 10 5 - 15      Assessment & Plan:    Encounter Diagnoses  Name Primary?  . Controlled diabetes mellitus type 2 with complications, unspecified whether long term insulin use ([place]) Yes  . Hyperlipidemia, unspecified hyperlipidemia type   . Essential hypertension   . Tobacco use disorder      -Reviewed labs with pt -pt to continue current medications -encouraged pt to wear mask when in public -pt will follow up in 3 months.  She is to contact office sooner prn

## 2019-03-01 ENCOUNTER — Encounter: Payer: Self-pay | Admitting: Physician Assistant

## 2019-03-20 ENCOUNTER — Ambulatory Visit: Payer: Self-pay | Admitting: Physician Assistant

## 2019-03-20 ENCOUNTER — Encounter: Payer: Self-pay | Admitting: Physician Assistant

## 2019-03-20 DIAGNOSIS — R519 Headache, unspecified: Secondary | ICD-10-CM

## 2019-03-20 NOTE — Progress Notes (Signed)
There were no vitals taken for this visit.   Subjective:    Patient ID: Shari Collins, female    DOB: 08/25/1976, 43 y.o.   MRN: [id number]  HPI: Shari Collins is a 43 y.o. female presenting on 03/20/2019 for Headache (for about a week. pt also c/o dizziness, nausea. pt has taken IBU but does not help)   HPI   This is a telemedicine appointment through [employer] due to coronavirus pandemic  I connected with  Shari Collins on 03/20/19 by a video enabled telemedicine application and verified that I am speaking with the correct person using two identifiers.   I discussed the limitations of evaluation and management by telemedicine. The patient expressed understanding and agreed to proceed.   Pt is at the grocery store.  Provider is at office.  Offered to contact pt later in the day when she is at home for privacy but pt prefers to continue at this time.     Pt with HA, nausea, dizziness.    HA all but 2 days past week.  IBU doesn't help  Symptoms x 1 wk  She works at [employer]  She says her blood sugars have been good  She has had No nasal congestion  Pt has Also had some rapid beating heart that comes and goes.     Relevant past medical, surgical, family and social history reviewed and updated as indicated. Interim medical history since our last visit reviewed. Allergies and medications reviewed and updated.    Current Outpatient Medications:  .  albuterol (PROVENTIL HFA;VENTOLIN HFA) 108 (90 Base) MCG/ACT inhaler, Inhale 2 puffs into the lungs every 6 (six) hours as needed for wheezing or shortness of breath., Disp: 1 Inhaler, Rfl: 2 .  cyclobenzaprine (FLEXERIL) 10 MG tablet, Take 1 tablet (10 mg total) by mouth 3 (three) times daily as needed for muscle spasms., Disp: 30 tablet, Rfl: 0 .  fenofibrate (TRICOR) 145 MG tablet, Take 1 tablet (145 mg total) by mouth daily., Disp: 30 tablet, Rfl: 4 .  Insulin Glargine (LANTUS SOLOSTAR) 100 UNIT/ML Solostar Pen, Inject  10 Units into the skin at bedtime. (Patient taking differently: Inject 32-35 Units into the skin at bedtime. ), Disp: 1 pen, Rfl: 0 .  JANUVIA 100 MG tablet, TAKE 1 Tablet BY MOUTH ONCE DAILY, Disp: 90 tablet, Rfl: 1 .  lisinopril (PRINIVIL,ZESTRIL) 10 MG tablet, Take 1 tablet by mouth once daily, Disp: 30 tablet, Rfl: 4 .  metFORMIN (GLUCOPHAGE) 1000 MG tablet, TAKE 1 TABLET BY MOUTH TWICE DAILY WITH A MEAL, Disp: 60 tablet, Rfl: 4 .  triamcinolone cream (KENALOG) 0.1 %, Apply 1 application topically 2 (two) times daily., Disp: 30 g, Rfl: 0    Review of Systems  Per HPI unless specifically indicated above     Objective:    There were no vitals taken for this visit.  Wt Readings from Last 3 Encounters:  11/03/18 200 lb 8 oz (90.9 kg)  09/05/18 204 lb 8 oz (92.8 kg)  08/22/18 195 lb 12 oz (88.8 kg)    Physical Exam Constitutional:      General: She is not in acute distress.    Appearance: She is well-developed. She is obese. She is not toxic-appearing.  HENT:     Head: Normocephalic and atraumatic.  Pulmonary:     Effort: Pulmonary effort is normal. No respiratory distress.  Neurological:     Mental Status: She is alert and oriented to person, place, and time.  Comments: Pt is a bit grumpier than usual.  Psychiatric:        Attention and Perception: Attention normal.        Mood and Affect: Affect is angry.        Speech: Speech normal.         Assessment & Plan:   Encounter Diagnosis  Name Primary?  . Acute nonintractable headache, unspecified headache type Yes     Encouraged pt to get tested for covid 19 due to her persistent HA and not feeling well and working in [employer] but she was adamantly refusing and cut the visit short after saying she wasn't going to do that

## 2019-05-24 ENCOUNTER — Other Ambulatory Visit: Payer: Self-pay | Admitting: Physician Assistant

## 2019-05-30 ENCOUNTER — Ambulatory Visit: Payer: Self-pay | Admitting: Physician Assistant

## 2019-06-01 ENCOUNTER — Other Ambulatory Visit: Payer: Self-pay | Admitting: Physician Assistant

## 2019-06-23 ENCOUNTER — Other Ambulatory Visit: Payer: Self-pay

## 2019-06-23 ENCOUNTER — Emergency Department (HOSPITAL_COMMUNITY)
Admission: EM | Admit: 2019-06-23 | Discharge: 2019-06-23 | Disposition: A | Discharge status: Home / Self Care | Payer: Self-pay | Attending: Emergency Medicine | Admitting: Emergency Medicine

## 2019-06-23 ENCOUNTER — Encounter (HOSPITAL_COMMUNITY): Payer: Self-pay | Admitting: Emergency Medicine

## 2019-06-23 DIAGNOSIS — Z91040 Latex allergy status: Secondary | ICD-10-CM | POA: Insufficient documentation

## 2019-06-23 DIAGNOSIS — Z79899 Other long term (current) drug therapy: Secondary | ICD-10-CM | POA: Insufficient documentation

## 2019-06-23 DIAGNOSIS — Z794 Long term (current) use of insulin: Secondary | ICD-10-CM | POA: Insufficient documentation

## 2019-06-23 DIAGNOSIS — E11319 Type 2 diabetes mellitus with unspecified diabetic retinopathy without macular edema: Secondary | ICD-10-CM | POA: Insufficient documentation

## 2019-06-23 DIAGNOSIS — I10 Essential (primary) hypertension: Secondary | ICD-10-CM | POA: Insufficient documentation

## 2019-06-23 DIAGNOSIS — E119 Type 2 diabetes mellitus without complications: Secondary | ICD-10-CM | POA: Insufficient documentation

## 2019-06-23 DIAGNOSIS — F17210 Nicotine dependence, cigarettes, uncomplicated: Secondary | ICD-10-CM | POA: Insufficient documentation

## 2019-06-23 DIAGNOSIS — R1012 Left upper quadrant pain: Secondary | ICD-10-CM | POA: Insufficient documentation

## 2019-06-23 LAB — CBC WITH DIFFERENTIAL/PLATELET
Abs Immature Granulocytes: 0.06 10*3/uL (ref 0.00–0.07)
Basophils Absolute: 0.1 10*3/uL (ref 0.0–0.1)
Basophils Relative: 1 %
Eosinophils Absolute: 0.7 10*3/uL — ABNORMAL HIGH (ref 0.0–0.5)
Eosinophils Relative: 5 %
HCT: 41 % (ref 36.0–46.0)
Hemoglobin: 13.3 g/dL (ref 12.0–15.0)
Immature Granulocytes: 0 %
Lymphocytes Relative: 16 %
Lymphs Abs: 2.2 10*3/uL (ref 0.7–4.0)
MCH: 31 pg (ref 26.0–34.0)
MCHC: 32.4 g/dL (ref 30.0–36.0)
MCV: 95.6 fL (ref 80.0–100.0)
Monocytes Absolute: 0.7 10*3/uL (ref 0.1–1.0)
Monocytes Relative: 5 %
Neutro Abs: 9.7 10*3/uL — ABNORMAL HIGH (ref 1.7–7.7)
Neutrophils Relative %: 73 %
Platelets: 402 10*3/uL — ABNORMAL HIGH (ref 150–400)
RBC: 4.29 MIL/uL (ref 3.87–5.11)
RDW: 14.2 % (ref 11.5–15.5)
WBC: 13.5 10*3/uL — ABNORMAL HIGH (ref 4.0–10.5)
nRBC: 0 % (ref 0.0–0.2)

## 2019-06-23 LAB — PREGNANCY, URINE: Preg Test, Ur: NEGATIVE

## 2019-06-23 LAB — URINALYSIS, ROUTINE W REFLEX MICROSCOPIC
Bilirubin Urine: NEGATIVE
Glucose, UA: NEGATIVE mg/dL
Hgb urine dipstick: NEGATIVE
Ketones, ur: NEGATIVE mg/dL
Leukocytes,Ua: NEGATIVE
Nitrite: NEGATIVE
Protein, ur: NEGATIVE mg/dL
Specific Gravity, Urine: 1.008 (ref 1.005–1.030)
pH: 5 (ref 5.0–8.0)

## 2019-06-23 LAB — COMPREHENSIVE METABOLIC PANEL
ALT: 24 U/L (ref 0–44)
AST: 73 U/L — ABNORMAL HIGH (ref 15–41)
Albumin: 4.2 g/dL (ref 3.5–5.0)
Alkaline Phosphatase: 43 U/L (ref 38–126)
Anion gap: 10 (ref 5–15)
BUN: 13 mg/dL (ref 6–20)
CO2: 22 mmol/L (ref 22–32)
Calcium: 9.3 mg/dL (ref 8.9–10.3)
Chloride: 100 mmol/L (ref 98–111)
Creatinine, Ser: 0.71 mg/dL (ref 0.44–1.00)
GFR calc Af Amer: >60 mL/min (ref >60)
GFR calc non Af Amer: >60 mL/min (ref >60)
Glucose, Bld: 139 mg/dL — ABNORMAL HIGH (ref 70–99)
Potassium: 4 mmol/L (ref 3.5–5.1)
Sodium: 132 mmol/L — ABNORMAL LOW (ref 135–145)
Total Bilirubin: 0.7 mg/dL (ref 0.3–1.2)
Total Protein: 7 g/dL (ref 6.5–8.1)

## 2019-06-23 LAB — LIPASE, BLOOD: Lipase: 19 U/L (ref 11–51)

## 2019-06-23 MED ORDER — OMEPRAZOLE 20 MG PO CPDR: 20.0000 mg | DELAYED_RELEASE_CAPSULE | Freq: Every day | ORAL | 0 refills | Status: DC

## 2019-06-23 MED ORDER — ALUM & MAG HYDROXIDE-SIMETH 200-200-20 MG/5ML PO SUSP
30.0000 mL | Freq: Once | ORAL | Status: AC
  Administered 2019-06-23: 30 mL via ORAL
  Filled 2019-06-23: qty 30

## 2019-06-23 MED ORDER — LIDOCAINE VISCOUS HCL 2 % MT SOLN
15.0000 mL | Freq: Once | OROMUCOSAL | Status: AC
  Administered 2019-06-23: 15 mL via ORAL
  Filled 2019-06-23: qty 15

## 2019-06-23 NOTE — ED Provider Notes (Signed)
[facility] EMERGENCY DEPARTMENT Provider Note   CSN: [id number] Arrival date & time: 06/23/19  1239     History   Chief Complaint Chief Complaint  Patient presents with  . Abdominal Pain    HPI Shari Collins is a 43 y.o. female history of HTN, DM 2, HLD, GERD, asthma  Patient presents for left upper quadrant abdominal pain that is burning, gnawing, dull that began today at 10:30 AM when she was at work.  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  Patient states she has not tried to eat since pain began.  States that she takes ibuprofen and 600 mg every day for the past several months for headaches.  Endorses rare alcohol use states that she had several drinks on her birthday 2 days ago however.  Denies emesis states that she has felt abdominal fullness but no sensation of nausea.   Patient also complains of burning chest pain in the center of her chest that started last night has been constant since states that it feels like indigestion and is nonexertional with no SOB, diaphoresis, or nausea associated.  No improvement with Tums, Rolaids, Gas-X.  Denies any burning of urination, vaginal discharge, frequency or hesitancy of urination, exertional worsening of symptoms, shortness of breath, diaphoresis, chest pressure.      HPI  Past Medical History:  Diagnosis Date  . Asthma   . Depression   . Diabetes mellitus   . Hypertension   . Noncompliance     Patient Active Problem List   Diagnosis Date Noted  . Diabetic retinopathy ([place]) 09/14/2018  . Hyperlipidemia 10/07/2015  . Breast abscess 10/02/2015  . Schizophrenia ([place]) 10/02/2015  . Bipolar disorder, unspecified ([place]) 10/02/2015  . Uncontrolled type 2 diabetes mellitus with complication ([place]) [id number]  . Personal history of noncompliance with medical treatment, presenting hazards to health 09/24/2015    Past Surgical History:  Procedure Laterality Date   . CESAREAN SECTION    . TONSILLECTOMY       OB History    Gravida  4   Para  1   Term  1   Preterm      AB  3   Living  1     SAB  3   TAB      Ectopic      Multiple      Live Births  1            Home Medications    Prior to Admission medications   Medication Sig Start Date End Date Taking? Authorizing Provider  albuterol (PROVENTIL HFA;VENTOLIN HFA) 108 (90 Base) MCG/ACT inhaler Inhale 2 puffs into the lungs every 6 (six) hours as needed for wheezing or shortness of breath. 08/24/17   Soyla Dryer, PA-C  cyclobenzaprine (FLEXERIL) 10 MG tablet Take 1 tablet (10 mg total) by mouth 3 (three) times daily as needed for muscle spasms. 09/05/18   Soyla Dryer, PA-C  fenofibrate (TRICOR) 145 MG tablet Take 1 tablet (145 mg total) by mouth daily. 08/22/18   Soyla Dryer, PA-C  Insulin Glargine (LANTUS SOLOSTAR) 100 UNIT/ML Solostar Pen Inject 10 Units into the skin at bedtime. Patient taking differently: Inject 32-35 Units into the skin at bedtime.  11/03/17   Soyla Dryer, PA-C  JANUVIA 100 MG tablet TAKE 1 Tablet BY MOUTH ONCE DAILY 06/06/18   Soyla Dryer, PA-C  lisinopril (ZESTRIL) 10 MG tablet Take 1 tablet  by mouth once daily 05/24/19   Soyla Dryer, PA-C  metFORMIN (GLUCOPHAGE) 1000 MG tablet TAKE 1 TABLET BY MOUTH TWICE DAILY WITH A MEAL 06/01/19   Soyla Dryer, PA-C  omeprazole (PRILOSEC) 20 MG capsule Take 1 capsule (20 mg total) by mouth daily for 14 days. 06/23/19 07/07/19  Tedd Sias, PA  triamcinolone cream (KENALOG) 0.1 % Apply 1 application topically 2 (two) times daily. 01/03/18   Soyla Dryer, PA-C    Family History Family History  Problem Relation Age of Onset  . Hypertension Paternal Grandmother   . Hypertension Maternal Grandmother   . Diabetes Father   . Hypertension Father   . Cancer Mother        liver  . Heart attack Paternal Grandfather   . Diabetes Brother     Social History Social History    Tobacco Use  . Smoking status: Current Every Day Smoker    Packs/day: 0.50    Years: 20.00    Pack years: 10.00    Types: Cigarettes  . Smokeless tobacco: Never Used  Substance Use Topics  . Alcohol use: Yes    Comment: Weekend -- almost a 5th  . Drug use: No     Allergies   Bee venom, Buspar [buspirone], Latex, Reglan [metoclopramide], and Sulfa antibiotics   Review of Systems Review of Systems  Constitutional: Negative for fever.  HENT: Negative for congestion.   Respiratory: Negative for cough.   Cardiovascular: Positive for chest pain. Negative for palpitations.  Gastrointestinal: Positive for abdominal pain. Negative for constipation, diarrhea and vomiting.  Genitourinary: Negative for dysuria, pelvic pain, urgency and vaginal discharge.  Musculoskeletal: Negative for neck pain.  Skin: Negative for rash.  Neurological: Negative for dizziness.     Physical Exam Updated Vital Signs BP 109/62 (BP Location: Right Arm)   Pulse 73   Temp 98.6 F (37 C) (Oral)   Resp 18   Ht 5\' 6"  (1.676 m)   Wt 86.2 kg   LMP 06/08/2019   SpO2 100%   BMI 30.67 kg/m   Physical Exam Vitals signs and nursing note reviewed.  Constitutional:      General: She is not in acute distress.    Appearance: She is obese. She is not ill-appearing.  HENT:     Head: Normocephalic and atraumatic.  Eyes:     General: No scleral icterus. Neck:     Musculoskeletal: No neck rigidity.  Cardiovascular:     Rate and Rhythm: Normal rate and regular rhythm.     Pulses: Normal pulses.     Heart sounds: Normal heart sounds.  Pulmonary:     Effort: Pulmonary effort is normal.     Breath sounds: Normal breath sounds.  Abdominal:     General: Abdomen is flat. Bowel sounds are normal. There is no distension.     Palpations: Abdomen is soft.     Tenderness: There is abdominal tenderness (Left upper quadrant tenderness with no guarding or rebound). There is no right CVA tenderness, left CVA  tenderness, guarding or rebound.  Musculoskeletal:     Right lower leg: No edema.     Left lower leg: No edema.     Comments: Patient has reproducible chest wall tenderness on left side that feels exactly the same as her chest pain  Skin:    General: Skin is warm and dry.     Capillary Refill: Capillary refill takes less than 2 seconds.  Neurological:     Mental Status: She is  alert. Mental status is at baseline.  Psychiatric:        Behavior: Behavior normal.      ED Treatments / Results  Labs (all labs ordered are listed, but only abnormal results are displayed) Labs Reviewed  COMPREHENSIVE METABOLIC PANEL - Abnormal; Notable for the following components:      Result Value   Sodium 132 (*)    Glucose, Bld 139 (*)    AST 73 (*)    All other components within normal limits  CBC WITH DIFFERENTIAL/PLATELET - Abnormal; Notable for the following components:   WBC 13.5 (*)    Platelets 402 (*)    Neutro Abs 9.7 (*)    Eosinophils Absolute 0.7 (*)    All other components within normal limits  LIPASE, BLOOD  URINALYSIS, ROUTINE W REFLEX MICROSCOPIC  PREGNANCY, URINE    EKG None  Radiology No results found.  Procedures Procedures (including critical care time)  Medications Ordered in ED Medications  alum & mag hydroxide-simeth (MAALOX/MYLANTA) 200-200-20 MG/5ML suspension 30 mL (30 mLs Oral Given 06/23/19 1414)    And  lidocaine (XYLOCAINE) 2 % viscous mouth solution 15 mL (15 mLs Oral Given 06/23/19 1414)     Initial Impression / Assessment and Plan / ED Course  I have reviewed the triage vital signs and the nursing notes.  Pertinent labs & imaging results that were available during my care of the patient were reviewed by me and considered in my medical decision making (see chart for details).        Patient with left upper abdominal pain for 1 day.  GI cocktail provided minimal relief patient states that pain is now 5/10 improved from 6/10.  On review of EMR  abdominal CT showed no acute abnormalities in 2018.  Patient has not had a history of diverticulitis.  Patient does not have pleuritic chest pain or symptoms concerning for pulmonary embolism.  Patient has negative symptoms I doubt pyelonephritis or kidney stone his urine was normal.  Patient with reassuring blood work overall mild elevated WBC of 13.5 which I doubt to be related to infection as patient has no fever or infectious symptoms.  Vitals within normal limits during entirety of visit.  Patient is chest pain is atypical for ACS and patient has no exertional worsening of CP, pleuritic chest pain, chest pressure, shortness of breath, explicitly denies nausea although she does states she feels abdominal fullness.  Due to patient's description and physical exam I do not suspect ACS of this patient.  Discussed this reasoning with patient who is understanding and is agreeable with plan.   Shared decision conversation with patient over imaging.  Patient agreeable to plan to discharge and treat empirically for PUD patient agrees to return if symptoms worsen or she has new or concerning symptoms.  Final Clinical Impressions(s) / ED Diagnoses   Final diagnoses:  Left upper quadrant abdominal pain    ED Discharge Orders         Ordered    omeprazole (PRILOSEC) 20 MG capsule  Daily     06/23/19 1604           Pati Gallo [place], [place] 06/23/19 1648    Lajean Saver, MD 06/26/19 1429

## 2019-06-23 NOTE — ED Triage Notes (Signed)
PT c/o left upper abdominal pain radiating into her back with nausea and no vomiting x1 day. PT denies any urinary symptoms.

## 2019-06-23 NOTE — Discharge Instructions (Signed)
Your primary care doctor.  Please return if you have any new or concerning symptoms.  Review information on peptic ulcer disease and follow dietary recommendations.

## 2019-06-26 ENCOUNTER — Other Ambulatory Visit: Payer: Self-pay | Admitting: Physician Assistant

## 2019-06-27 ENCOUNTER — Other Ambulatory Visit (HOSPITAL_COMMUNITY)
Admission: RE | Admit: 2019-06-27 | Discharge: 2019-06-27 | Disposition: A | Discharge status: Home / Self Care | Payer: Self-pay | Source: Ambulatory Visit | Attending: Physician Assistant | Admitting: Physician Assistant

## 2019-06-27 DIAGNOSIS — E118 Type 2 diabetes mellitus with unspecified complications: Secondary | ICD-10-CM | POA: Insufficient documentation

## 2019-06-27 DIAGNOSIS — E785 Hyperlipidemia, unspecified: Secondary | ICD-10-CM | POA: Insufficient documentation

## 2019-06-27 DIAGNOSIS — I10 Essential (primary) hypertension: Secondary | ICD-10-CM | POA: Insufficient documentation

## 2019-06-27 LAB — LIPID PANEL
Cholesterol: 168 mg/dL (ref 0–200)
HDL: 39 mg/dL — ABNORMAL LOW (ref >40)
LDL Cholesterol: 102 mg/dL — ABNORMAL HIGH (ref 0–99)
Total CHOL/HDL Ratio: 4.3 RATIO
Triglycerides: 136 mg/dL (ref <150)
VLDL: 27 mg/dL (ref 0–40)

## 2019-06-27 LAB — COMPREHENSIVE METABOLIC PANEL
ALT: 16 U/L (ref 0–44)
AST: 16 U/L (ref 15–41)
Albumin: 4 g/dL (ref 3.5–5.0)
Alkaline Phosphatase: 36 U/L — ABNORMAL LOW (ref 38–126)
Anion gap: 7 (ref 5–15)
BUN: 25 mg/dL — ABNORMAL HIGH (ref 6–20)
CO2: 23 mmol/L (ref 22–32)
Calcium: 8.9 mg/dL (ref 8.9–10.3)
Chloride: 105 mmol/L (ref 98–111)
Creatinine, Ser: 0.76 mg/dL (ref 0.44–1.00)
GFR calc Af Amer: >60 mL/min (ref >60)
GFR calc non Af Amer: >60 mL/min (ref >60)
Glucose, Bld: 111 mg/dL — ABNORMAL HIGH (ref 70–99)
Potassium: 4.8 mmol/L (ref 3.5–5.1)
Sodium: 135 mmol/L (ref 135–145)
Total Bilirubin: 0.1 mg/dL — ABNORMAL LOW (ref 0.3–1.2)
Total Protein: 7 g/dL (ref 6.5–8.1)

## 2019-06-27 LAB — HEMOGLOBIN A1C
Hgb A1c MFr Bld: 7.4 % — ABNORMAL HIGH (ref 4.8–5.6)
Mean Plasma Glucose: 165.68 mg/dL

## 2019-06-28 LAB — MICROALBUMIN, URINE: Microalb, Ur: 14.2 ug/mL — ABNORMAL HIGH

## 2019-06-29 ENCOUNTER — Encounter: Payer: Self-pay | Admitting: Physician Assistant

## 2019-06-29 ENCOUNTER — Other Ambulatory Visit: Payer: Self-pay

## 2019-06-29 ENCOUNTER — Ambulatory Visit: Payer: Self-pay | Admitting: Physician Assistant

## 2019-06-29 VITALS — BP 100/62 | HR 83 | Temp 98.1°F | Wt 202.0 lb

## 2019-06-29 DIAGNOSIS — Z8742 Personal history of other diseases of the female genital tract: Secondary | ICD-10-CM

## 2019-06-29 DIAGNOSIS — F17200 Nicotine dependence, unspecified, uncomplicated: Secondary | ICD-10-CM

## 2019-06-29 DIAGNOSIS — I10 Essential (primary) hypertension: Secondary | ICD-10-CM

## 2019-06-29 DIAGNOSIS — IMO0002 Reserved for concepts with insufficient information to code with codable children: Secondary | ICD-10-CM

## 2019-06-29 DIAGNOSIS — E785 Hyperlipidemia, unspecified: Secondary | ICD-10-CM

## 2019-06-29 DIAGNOSIS — K219 Gastro-esophageal reflux disease without esophagitis: Secondary | ICD-10-CM

## 2019-06-29 DIAGNOSIS — E1165 Type 2 diabetes mellitus with hyperglycemia: Secondary | ICD-10-CM

## 2019-06-29 MED ORDER — LISINOPRIL 10 MG PO TABS: 10.0000 mg | ORAL_TABLET | Freq: Every day | ORAL | 3 refills | Status: DC

## 2019-06-29 MED ORDER — OMEPRAZOLE 40 MG PO CPDR: 40.0000 mg | DELAYED_RELEASE_CAPSULE | Freq: Every day | ORAL | 3 refills | Status: DC

## 2019-06-29 MED ORDER — METFORMIN HCL 1000 MG PO TABS: ORAL_TABLET | ORAL | 3 refills | Status: DC

## 2019-06-29 MED ORDER — FENOFIBRATE 145 MG PO TABS: 145.0000 mg | ORAL_TABLET | Freq: Every day | ORAL | 4 refills | Status: DC

## 2019-06-29 NOTE — Progress Notes (Signed)
BP 100/62   Pulse 83   Temp 98.1 F (36.7 C)   Wt 202 lb (91.6 kg)   LMP 06/08/2019   SpO2 97%   BMI 32.60 kg/m    Subjective:    Patient ID: Shari Collins, female    DOB: [birth year], 43 y.o.   MRN: [id number]  HPI: Shari Collins is a 43 y.o. female presenting on 06/29/2019 for Diabetes and Hyperlipidemia   HPI   Pt had a negative covid 19 screening questionnaire     Pt is still working at [employer].  She says that is going well.    She is currently usling 32u lantus.    Mostly blood sugars around 150 when she checks it.  She gets lows 2 or 3 times/week.  It's Between 27 and lower 80s.  She says she has always been a meal skipper and doesn't eat regularly.   Pt out of januvia for she thinks over a year  Pt recently seen in ER and got rx omeprazole.  Pt says not much improved.  She is still having a lot of burning.  She says her only MH issues is anger.  She doing ok with that.   She isn't seeing counselor at this time   Relevant past medical, surgical, family and social history reviewed and updated as indicated. Interim medical history since our last visit reviewed. Allergies and medications reviewed and updated.   Current Outpatient Medications:  .  albuterol (PROVENTIL HFA;VENTOLIN HFA) 108 (90 Base) MCG/ACT inhaler, Inhale 2 puffs into the lungs every 6 (six) hours as needed for wheezing or shortness of breath., Disp: 1 Inhaler, Rfl: 2 .  fenofibrate (TRICOR) 145 MG tablet, Take 1 tablet (145 mg total) by mouth daily., Disp: 30 tablet, Rfl: 4 .  Insulin Glargine (LANTUS SOLOSTAR) 100 UNIT/ML Solostar Pen, Inject 10 Units into the skin at bedtime. (Patient taking differently: Inject 32 Units into the skin at bedtime. ), Disp: 1 pen, Rfl: 0 .  lisinopril (ZESTRIL) 10 MG tablet, Take 1 tablet by mouth once daily, Disp: 7 tablet, Rfl: 0 .  metFORMIN (GLUCOPHAGE) 1000 MG tablet, TAKE 1 TABLET BY MOUTH TWICE DAILY WITH A MEAL, Disp: 14 tablet, Rfl: 0 .  omeprazole  (PRILOSEC) 20 MG capsule, Take 1 capsule (20 mg total) by mouth daily for 14 days., Disp: 14 capsule, Rfl: 0 .  triamcinolone cream (KENALOG) 0.1 %, Apply 1 application topically 2 (two) times daily. (Patient taking differently: Apply 1 application topically 2 (two) times daily as needed. ), Disp: 30 g, Rfl: 0    Review of Systems  Per HPI unless specifically indicated above     Objective:    BP 100/62   Pulse 83   Temp 98.1 F (36.7 C)   Wt 202 lb (91.6 kg)   LMP 06/08/2019   SpO2 97%   BMI 32.60 kg/m   Wt Readings from Last 3 Encounters:  06/29/19 202 lb (91.6 kg)  06/23/19 190 lb (86.2 kg)  11/03/18 200 lb 8 oz (90.9 kg)    Physical Exam Vitals signs reviewed.  Constitutional:      General: She is not in acute distress.    Appearance: Normal appearance. She is well-developed. She is not ill-appearing.  HENT:     Head: Normocephalic and atraumatic.  Neck:     Musculoskeletal: Neck supple.  Cardiovascular:     Rate and Rhythm: Normal rate and regular rhythm.  Pulmonary:     Effort: Pulmonary  effort is normal.     Breath sounds: Normal breath sounds.  Abdominal:     General: Bowel sounds are normal.     Palpations: Abdomen is soft. There is no mass.     Tenderness: There is abdominal tenderness in the epigastric area. There is no guarding or rebound.     Comments: Mild tenderness epigastric area  Lymphadenopathy:     Cervical: No cervical adenopathy.  Skin:    General: Skin is warm and dry.  Neurological:     Mental Status: She is alert and oriented to person, place, and time.  Psychiatric:        Attention and Perception: Attention normal.        Speech: Speech normal.        Behavior: Behavior normal. Behavior is cooperative.     Results for orders placed or performed during the hospital encounter of 06/27/19  Comprehensive metabolic panel  Result Value Ref Range   Sodium 135 135 - 145 mmol/L   Potassium 4.8 3.5 - 5.1 mmol/L   Chloride 105 98 - 111  mmol/L   CO2 23 22 - 32 mmol/L   Glucose, Bld 111 (H) 70 - 99 mg/dL   BUN 25 (H) 6 - 20 mg/dL   Creatinine, Ser 0.76 0.44 - 1.00 mg/dL   Calcium 8.9 8.9 - 10.3 mg/dL   Total Protein 7.0 6.5 - 8.1 g/dL   Albumin 4.0 3.5 - 5.0 g/dL   AST 16 15 - 41 U/L   ALT 16 0 - 44 U/L   Alkaline Phosphatase 36 (L) 38 - 126 U/L   Total Bilirubin 0.1 (L) 0.3 - 1.2 mg/dL   GFR calc non Af Amer >60 >60 mL/min   GFR calc Af Amer >60 >60 mL/min   Anion gap 7 5 - 15  Lipid panel  Result Value Ref Range   Cholesterol 168 0 - 200 mg/dL   Triglycerides 136 <150 mg/dL   HDL 39 (L) >40 mg/dL   Total CHOL/HDL Ratio 4.3 RATIO   VLDL 27 0 - 40 mg/dL   LDL Cholesterol 102 (H) 0 - 99 mg/dL  Hemoglobin A1c  Result Value Ref Range   Hgb A1c MFr Bld 7.4 (H) 4.8 - 5.6 %   Mean Plasma Glucose 165.68 mg/dL  Microalbumin, urine  Result Value Ref Range   Microalb, Ur 14.2 (H) Not Estab. ug/mL      Assessment & Plan:    Encounter Diagnoses  Name Primary?  Marland Kitchen Uncontrolled type 2 diabetes mellitus with complication, with long-term current use of insulin ([place]) Yes  . Hyperlipidemia, unspecified hyperlipidemia type   . Essential hypertension   . Tobacco use disorder   . Gastroesophageal reflux disease, unspecified whether esophagitis present   . History of abnormal cervical Pap smear      -Reviewed labs with pt  -pt to Continue curent meds -counseled pt to avoid meal skipping -She already had flu shot this season -will Increase ppi to 40mg .  Counseled pt to avoid spicy or greasy foods -pt needs to Update PAP-  Pt had colposcopy 07/2017.  She never followed up with gyn as recommended.    She says she is going to apply for family planning medicaid and wants to wait until that gets renewed before proceeding with follow up for gyn issues -DM foot exam updated -Pt to follow up in 3 months.  Patient is to contact office sooner for any problems

## 2019-09-27 ENCOUNTER — Other Ambulatory Visit (HOSPITAL_COMMUNITY)
Admission: RE | Admit: 2019-09-27 | Discharge: 2019-09-27 | Disposition: A | Discharge status: Home / Self Care | Payer: Self-pay | Source: Ambulatory Visit | Attending: Physician Assistant | Admitting: Physician Assistant

## 2019-09-27 DIAGNOSIS — I10 Essential (primary) hypertension: Secondary | ICD-10-CM | POA: Insufficient documentation

## 2019-09-27 DIAGNOSIS — E1165 Type 2 diabetes mellitus with hyperglycemia: Secondary | ICD-10-CM | POA: Insufficient documentation

## 2019-09-27 DIAGNOSIS — E118 Type 2 diabetes mellitus with unspecified complications: Secondary | ICD-10-CM | POA: Insufficient documentation

## 2019-09-27 DIAGNOSIS — IMO0002 Reserved for concepts with insufficient information to code with codable children: Secondary | ICD-10-CM

## 2019-09-27 DIAGNOSIS — Z794 Long term (current) use of insulin: Secondary | ICD-10-CM | POA: Insufficient documentation

## 2019-09-27 DIAGNOSIS — E785 Hyperlipidemia, unspecified: Secondary | ICD-10-CM | POA: Insufficient documentation

## 2019-09-27 LAB — COMPREHENSIVE METABOLIC PANEL
ALT: 19 U/L (ref 0–44)
AST: 14 U/L — ABNORMAL LOW (ref 15–41)
Albumin: 3.9 g/dL (ref 3.5–5.0)
Alkaline Phosphatase: 39 U/L (ref 38–126)
Anion gap: 8 (ref 5–15)
BUN: 15 mg/dL (ref 6–20)
CO2: 27 mmol/L (ref 22–32)
Calcium: 9 mg/dL (ref 8.9–10.3)
Chloride: 101 mmol/L (ref 98–111)
Creatinine, Ser: 0.62 mg/dL (ref 0.44–1.00)
GFR calc Af Amer: >60 mL/min (ref >60)
GFR calc non Af Amer: >60 mL/min (ref >60)
Glucose, Bld: 123 mg/dL — ABNORMAL HIGH (ref 70–99)
Potassium: 4.5 mmol/L (ref 3.5–5.1)
Sodium: 136 mmol/L (ref 135–145)
Total Bilirubin: 0.6 mg/dL (ref 0.3–1.2)
Total Protein: 6.9 g/dL (ref 6.5–8.1)

## 2019-09-27 LAB — LIPID PANEL
Cholesterol: 198 mg/dL (ref 0–200)
HDL: 47 mg/dL (ref >40)
LDL Cholesterol: 128 mg/dL — ABNORMAL HIGH (ref 0–99)
Total CHOL/HDL Ratio: 4.2 RATIO
Triglycerides: 117 mg/dL (ref <150)
VLDL: 23 mg/dL (ref 0–40)

## 2019-09-27 LAB — HEMOGLOBIN A1C
Hgb A1c MFr Bld: 7.3 % — ABNORMAL HIGH (ref 4.8–5.6)
Mean Plasma Glucose: 162.81 mg/dL

## 2019-10-03 ENCOUNTER — Ambulatory Visit: Payer: Self-pay | Admitting: Physician Assistant

## 2019-10-03 ENCOUNTER — Encounter: Payer: Self-pay | Admitting: Physician Assistant

## 2019-10-03 DIAGNOSIS — K219 Gastro-esophageal reflux disease without esophagitis: Secondary | ICD-10-CM

## 2019-10-03 DIAGNOSIS — E785 Hyperlipidemia, unspecified: Secondary | ICD-10-CM

## 2019-10-03 DIAGNOSIS — F17200 Nicotine dependence, unspecified, uncomplicated: Secondary | ICD-10-CM

## 2019-10-03 DIAGNOSIS — Z8742 Personal history of other diseases of the female genital tract: Secondary | ICD-10-CM

## 2019-10-03 DIAGNOSIS — E1165 Type 2 diabetes mellitus with hyperglycemia: Secondary | ICD-10-CM

## 2019-10-03 DIAGNOSIS — Z1239 Encounter for other screening for malignant neoplasm of breast: Secondary | ICD-10-CM

## 2019-10-03 DIAGNOSIS — IMO0002 Reserved for concepts with insufficient information to code with codable children: Secondary | ICD-10-CM

## 2019-10-03 DIAGNOSIS — I10 Essential (primary) hypertension: Secondary | ICD-10-CM

## 2019-10-03 MED ORDER — LISINOPRIL 10 MG PO TABS: 10.0000 mg | ORAL_TABLET | Freq: Every day | ORAL | 1 refills | Status: DC

## 2019-10-03 MED ORDER — ATORVASTATIN CALCIUM 20 MG PO TABS: 20.0000 mg | ORAL_TABLET | Freq: Every day | ORAL | 1 refills | Status: DC

## 2019-10-03 MED ORDER — METFORMIN HCL 1000 MG PO TABS: ORAL_TABLET | ORAL | 1 refills | Status: DC

## 2019-10-03 MED ORDER — LANTUS SOLOSTAR 100 UNIT/ML ~~LOC~~ SOPN: 32.0000 [IU] | PEN_INJECTOR | Freq: Every day | SUBCUTANEOUS | Status: DC

## 2019-10-03 MED ORDER — OMEPRAZOLE 40 MG PO CPDR: 40.0000 mg | DELAYED_RELEASE_CAPSULE | Freq: Every day | ORAL | 1 refills | Status: DC

## 2019-10-03 NOTE — Progress Notes (Signed)
There were no vitals taken for this visit.   Subjective:    Patient ID: Shari Collins, female    DOB: 1975-11-21, 44 y.o.   MRN: [id number]  HPI: Shari Collins is a 44 y.o. female presenting on 10/03/2019 for No chief complaint on file.   HPI   This is a telemedicine appointment through [employer] due to coronavirus pandemic.  I connected with  Shari Collins on 10/03/19 by a video enabled telemedicine application and verified that I am speaking with the correct person using two identifiers.   I discussed the limitations of evaluation and management by telemedicine. The patient expressed understanding and agreed to proceed.  Pt is at home.  Provider is at office   Pt is 85 yoF with DM, dyslipidemia who has appt for follow up today.  She says she is still working at [employer].  She is Not taking omeprazole due to cost-   Pt was on medassist in the past and she says she doesn't know if she is still eligible.    She is currently using lantus 32u .  She checks her sugars.   She says She is doing good.  She denies anxiety, depression.  She has no complaints today.    Relevant past medical, surgical, family and social history reviewed and updated as indicated. Interim medical history since our last visit reviewed. Allergies and medications reviewed and updated.   Current Outpatient Medications:  .  fenofibrate (TRICOR) 145 MG tablet, Take 1 tablet (145 mg total) by mouth daily., Disp: 30 tablet, Rfl: 4 .  Insulin Glargine (LANTUS SOLOSTAR) 100 UNIT/ML Solostar Pen, Inject 10 Units into the skin at bedtime. (Patient taking differently: Inject 32 Units into the skin at bedtime. ), Disp: 1 pen, Rfl: 0 .  lisinopril (ZESTRIL) 10 MG tablet, Take 1 tablet (10 mg total) by mouth daily., Disp: 30 tablet, Rfl: 3 .  metFORMIN (GLUCOPHAGE) 1000 MG tablet, TAKE 1 TABLET BY MOUTH TWICE DAILY WITH A MEAL, Disp: 60 tablet, Rfl: 3 .  albuterol (PROVENTIL HFA;VENTOLIN HFA) 108 (90 Base) MCG/ACT  inhaler, Inhale 2 puffs into the lungs every 6 (six) hours as needed for wheezing or shortness of breath. (Patient not taking: Reported on 10/03/2019), Disp: 1 Inhaler, Rfl: 2 .  omeprazole (PRILOSEC) 40 MG capsule, Take 1 capsule (40 mg total) by mouth daily. (Patient not taking: Reported on 10/03/2019), Disp: 30 capsule, Rfl: 3 .  triamcinolone cream (KENALOG) 0.1 %, Apply 1 application topically 2 (two) times daily. (Patient not taking: Reported on 10/03/2019), Disp: 30 g, Rfl: 0  \  Review of Systems  Per HPI unless specifically indicated above     Objective:    There were no vitals taken for this visit.  Wt Readings from Last 3 Encounters:  06/29/19 202 lb (91.6 kg)  06/23/19 190 lb (86.2 kg)  11/03/18 200 lb 8 oz (90.9 kg)    Physical Exam Constitutional:      General: She is not in acute distress.    Appearance: Normal appearance. She is not ill-appearing.  HENT:     Head: Normocephalic and atraumatic.  Pulmonary:     Effort: Pulmonary effort is normal. No respiratory distress.  Neurological:     Mental Status: She is alert and oriented to person, place, and time.  Psychiatric:        Attention and Perception: Attention normal.        Speech: Speech normal.  Behavior: Behavior is cooperative.     Results for orders placed or performed during the hospital encounter of 09/27/19  Hemoglobin A1c  Result Value Ref Range   Hgb A1c MFr Bld 7.3 (H) 4.8 - 5.6 %   Mean Plasma Glucose 162.81 mg/dL  Comprehensive metabolic panel  Result Value Ref Range   Sodium 136 135 - 145 mmol/L   Potassium 4.5 3.5 - 5.1 mmol/L   Chloride 101 98 - 111 mmol/L   CO2 27 22 - 32 mmol/L   Glucose, Bld 123 (H) 70 - 99 mg/dL   BUN 15 6 - 20 mg/dL   Creatinine, Ser 0.62 0.44 - 1.00 mg/dL   Calcium 9.0 8.9 - 10.3 mg/dL   Total Protein 6.9 6.5 - 8.1 g/dL   Albumin 3.9 3.5 - 5.0 g/dL   AST 14 (L) 15 - 41 U/L   ALT 19 0 - 44 U/L   Alkaline Phosphatase 39 38 - 126 U/L   Total Bilirubin 0.6  0.3 - 1.2 mg/dL   GFR calc non Af Amer >60 >60 mL/min   GFR calc Af Amer >60 >60 mL/min   Anion gap 8 5 - 15  Lipid panel  Result Value Ref Range   Cholesterol 198 0 - 200 mg/dL   Triglycerides 117 <150 mg/dL   HDL 47 >40 mg/dL   Total CHOL/HDL Ratio 4.2 RATIO   VLDL 23 0 - 40 mg/dL   LDL Cholesterol 128 (H) 0 - 99 mg/dL      Assessment & Plan:    Encounter Diagnoses  Name Primary?  Marland Kitchen Uncontrolled type 2 diabetes mellitus with complication, with long-term current use of insulin ([place]) Yes  . Hyperlipidemia, unspecified hyperlipidemia type   . Essential hypertension   . Tobacco use disorder   . Gastroesophageal reflux disease, unspecified whether esophagitis present   . History of abnormal cervical Pap smear   . Encounter for screening for malignant neoplasm of breast, unspecified screening modality      -reviewed labs with pt  -will add atorvastatin.  Pt to continue lowfat diet -will Check medassist eligibility and send rx -pt to Continue fenofibrate from [facility] -will refer for routine Screening mammogram -pt to watch diabetic diet and notify office for any changes in bs -will Update PAP at next OV in 3 months.  Pt had abnormal PAP and colphoscopy but she never followed up with GI because she says she had to pay (because she didn't fill out [facility] financial assistance application).  Pt to contact office sooner prn

## 2019-10-09 ENCOUNTER — Other Ambulatory Visit: Payer: Self-pay | Admitting: Physician Assistant

## 2019-10-09 DIAGNOSIS — Z1239 Encounter for other screening for malignant neoplasm of breast: Secondary | ICD-10-CM

## 2019-10-25 ENCOUNTER — Other Ambulatory Visit: Payer: Self-pay

## 2019-10-25 ENCOUNTER — Ambulatory Visit (HOSPITAL_COMMUNITY)
Admission: RE | Admit: 2019-10-25 | Discharge: 2019-10-25 | Disposition: A | Discharge status: Home / Self Care | Payer: Self-pay | Source: Ambulatory Visit | Attending: Physician Assistant | Admitting: Physician Assistant

## 2019-10-25 DIAGNOSIS — Z1239 Encounter for other screening for malignant neoplasm of breast: Secondary | ICD-10-CM

## 2019-10-28 ENCOUNTER — Other Ambulatory Visit: Payer: Self-pay | Admitting: Physician Assistant

## 2019-11-09 ENCOUNTER — Other Ambulatory Visit: Payer: Self-pay | Admitting: Physician Assistant

## 2019-11-09 MED ORDER — LISINOPRIL 10 MG PO TABS: 10.0000 mg | ORAL_TABLET | Freq: Every day | ORAL | 1 refills | Status: DC

## 2019-11-09 MED ORDER — OMEPRAZOLE 40 MG PO CPDR: 40.0000 mg | DELAYED_RELEASE_CAPSULE | Freq: Every day | ORAL | 1 refills | Status: DC

## 2019-11-09 MED ORDER — METFORMIN HCL 1000 MG PO TABS: ORAL_TABLET | ORAL | 1 refills | Status: DC

## 2019-11-09 MED ORDER — ATORVASTATIN CALCIUM 20 MG PO TABS: 20.0000 mg | ORAL_TABLET | Freq: Every day | ORAL | 1 refills | Status: DC

## 2019-11-09 MED ORDER — ALBUTEROL SULFATE HFA 108 (90 BASE) MCG/ACT IN AERS: 2.0000 | INHALATION_SPRAY | Freq: Four times a day (QID) | RESPIRATORY_TRACT | 0 refills | Status: DC | PRN

## 2019-11-21 ENCOUNTER — Ambulatory Visit: Payer: Self-pay | Admitting: Physician Assistant

## 2019-11-21 DIAGNOSIS — N644 Mastodynia: Secondary | ICD-10-CM

## 2019-11-21 DIAGNOSIS — N6452 Nipple discharge: Secondary | ICD-10-CM

## 2019-11-21 MED ORDER — DICLOXACILLIN SODIUM 500 MG PO CAPS: 500.0000 mg | ORAL_CAPSULE | Freq: Four times a day (QID) | ORAL | 0 refills | Status: DC

## 2019-11-21 NOTE — Progress Notes (Signed)
LMP 10/22/2019    Subjective:    Patient ID: Shari Collins, female    DOB: 1976-02-27, 44 y.o.   MRN: [id number]  HPI: Shari Collins is a 44 y.o. female presenting on 11/21/2019 for No chief complaint on file.   HPI  This is a telemedicine appointment due to coronavirus pandemic.  It is via Telephone as pt does not have a video enabled device.  I connected with  [place] on 11/21/19 by a video enabled telemedicine application and verified that I am speaking with the correct person using two identifiers.   I discussed the limitations of evaluation and management by telemedicine. The patient expressed understanding and agreed to proceed.  Pt is at home.  Provider is at office   Pt says she is having Discharge from nipple and She has pain in her breast.  She says she has No lump.  She says This started yesterday.  This is like it has been at previous times.   Last time she had this flare up was end of 2018.  Normal mammogram 10/25/19      Relevant past medical, surgical, family and social history reviewed and updated as indicated. Interim medical history since our last visit reviewed. Allergies and medications reviewed and updated.   Current Outpatient Medications:  .  albuterol (VENTOLIN HFA) 108 (90 Base) MCG/ACT inhaler, Inhale 2 puffs into the lungs every 6 (six) hours as needed for wheezing or shortness of breath., Disp: 18 g, Rfl: 0 .  fenofibrate (TRICOR) 145 MG tablet, Take 1 tablet (145 mg total) by mouth daily., Disp: 30 tablet, Rfl: 4 .  Insulin Glargine (LANTUS SOLOSTAR) 100 UNIT/ML Solostar Pen, Inject 32 Units into the skin at bedtime., Disp: , Rfl:  .  lisinopril (ZESTRIL) 10 MG tablet, Take 1 tablet (10 mg total) by mouth daily., Disp: 90 tablet, Rfl: 1 .  metFORMIN (GLUCOPHAGE) 1000 MG tablet, TAKE 1 TABLET BY MOUTH TWICE DAILY WITH A MEAL, Disp: 180 tablet, Rfl: 1 .  omeprazole (PRILOSEC) 40 MG capsule, Take 1 capsule (40 mg total) by mouth daily.,  Disp: 90 capsule, Rfl: 1 .  atorvastatin (LIPITOR) 20 MG tablet, Take 1 tablet (20 mg total) by mouth daily. (Patient not taking: Reported on 11/21/2019), Disp: 90 tablet, Rfl: 1 .  triamcinolone cream (KENALOG) 0.1 %, Apply 1 application topically 2 (two) times daily. (Patient not taking: Reported on 10/03/2019), Disp: 30 g, Rfl: 0     Review of Systems  Per HPI unless specifically indicated above     Objective:    LMP 10/22/2019   Wt Readings from Last 3 Encounters:  06/29/19 202 lb (91.6 kg)  06/23/19 190 lb (86.2 kg)  11/03/18 200 lb 8 oz (90.9 kg)    Physical Exam Pulmonary:     Effort: No respiratory distress.  Neurological:     Mental Status: She is alert and oriented to person, place, and time.  Psychiatric:        Attention and Perception: Attention normal.        Speech: Speech normal.        Behavior: Behavior is cooperative.           Assessment & Plan:    Encounter Diagnoses  Name Primary?  . Nipple discharge in female Yes  . Breast pain      -rx diclox faxed with coupon to [facility]  [place] -pt to use warm compresses -pt to follow up 1 month as scheduled.  She is to contact office sooner for any worsening or if fails to resolve with antibiotics.

## 2019-11-22 ENCOUNTER — Encounter: Payer: Self-pay | Admitting: Physician Assistant

## 2020-01-01 ENCOUNTER — Ambulatory Visit: Payer: Self-pay | Admitting: Physician Assistant

## 2020-01-04 ENCOUNTER — Ambulatory Visit: Payer: Self-pay | Admitting: Physician Assistant

## 2020-01-12 ENCOUNTER — Other Ambulatory Visit (HOSPITAL_COMMUNITY)
Admission: RE | Admit: 2020-01-12 | Discharge: 2020-01-12 | Disposition: A | Discharge status: Home / Self Care | Payer: Self-pay | Source: Ambulatory Visit | Attending: Physician Assistant | Admitting: Physician Assistant

## 2020-01-12 DIAGNOSIS — E118 Type 2 diabetes mellitus with unspecified complications: Secondary | ICD-10-CM | POA: Insufficient documentation

## 2020-01-12 DIAGNOSIS — IMO0002 Reserved for concepts with insufficient information to code with codable children: Secondary | ICD-10-CM

## 2020-01-12 DIAGNOSIS — Z794 Long term (current) use of insulin: Secondary | ICD-10-CM | POA: Insufficient documentation

## 2020-01-12 DIAGNOSIS — I10 Essential (primary) hypertension: Secondary | ICD-10-CM | POA: Insufficient documentation

## 2020-01-12 DIAGNOSIS — E785 Hyperlipidemia, unspecified: Secondary | ICD-10-CM | POA: Insufficient documentation

## 2020-01-12 DIAGNOSIS — E1165 Type 2 diabetes mellitus with hyperglycemia: Secondary | ICD-10-CM | POA: Insufficient documentation

## 2020-01-12 LAB — COMPREHENSIVE METABOLIC PANEL
ALT: 19 U/L (ref 0–44)
AST: 16 U/L (ref 15–41)
Albumin: 4.1 g/dL (ref 3.5–5.0)
Alkaline Phosphatase: 38 U/L (ref 38–126)
Anion gap: 11 (ref 5–15)
BUN: 18 mg/dL (ref 6–20)
CO2: 23 mmol/L (ref 22–32)
Calcium: 8.8 mg/dL — ABNORMAL LOW (ref 8.9–10.3)
Chloride: 99 mmol/L (ref 98–111)
Creatinine, Ser: 0.72 mg/dL (ref 0.44–1.00)
GFR calc Af Amer: >60 mL/min (ref >60)
GFR calc non Af Amer: >60 mL/min (ref >60)
Glucose, Bld: 143 mg/dL — ABNORMAL HIGH (ref 70–99)
Potassium: 4.6 mmol/L (ref 3.5–5.1)
Sodium: 133 mmol/L — ABNORMAL LOW (ref 135–145)
Total Bilirubin: 0.7 mg/dL (ref 0.3–1.2)
Total Protein: 7.1 g/dL (ref 6.5–8.1)

## 2020-01-12 LAB — LIPID PANEL
Cholesterol: 177 mg/dL (ref 0–200)
HDL: 45 mg/dL (ref >40)
LDL Cholesterol: 111 mg/dL — ABNORMAL HIGH (ref 0–99)
Total CHOL/HDL Ratio: 3.9 RATIO
Triglycerides: 104 mg/dL (ref <150)
VLDL: 21 mg/dL (ref 0–40)

## 2020-01-12 LAB — HEMOGLOBIN A1C
Hgb A1c MFr Bld: 7.5 % — ABNORMAL HIGH (ref 4.8–5.6)
Mean Plasma Glucose: 168.55 mg/dL

## 2020-01-16 ENCOUNTER — Other Ambulatory Visit (HOSPITAL_COMMUNITY)
Admission: RE | Admit: 2020-01-16 | Discharge: 2020-01-16 | Disposition: A | Discharge status: Home / Self Care | Payer: Self-pay | Source: Ambulatory Visit | Attending: Physician Assistant | Admitting: Physician Assistant

## 2020-01-16 ENCOUNTER — Ambulatory Visit: Payer: Self-pay | Admitting: Physician Assistant

## 2020-01-16 ENCOUNTER — Encounter: Payer: Self-pay | Admitting: Physician Assistant

## 2020-01-16 VITALS — BP 134/78 | HR 77 | Temp 97.9°F | Wt 200.6 lb

## 2020-01-16 DIAGNOSIS — I10 Essential (primary) hypertension: Secondary | ICD-10-CM

## 2020-01-16 DIAGNOSIS — R002 Palpitations: Secondary | ICD-10-CM

## 2020-01-16 DIAGNOSIS — Z124 Encounter for screening for malignant neoplasm of cervix: Secondary | ICD-10-CM | POA: Insufficient documentation

## 2020-01-16 DIAGNOSIS — E785 Hyperlipidemia, unspecified: Secondary | ICD-10-CM

## 2020-01-16 DIAGNOSIS — Z2821 Immunization not carried out because of patient refusal: Secondary | ICD-10-CM

## 2020-01-16 DIAGNOSIS — IMO0002 Reserved for concepts with insufficient information to code with codable children: Secondary | ICD-10-CM

## 2020-01-16 DIAGNOSIS — F17200 Nicotine dependence, unspecified, uncomplicated: Secondary | ICD-10-CM

## 2020-01-16 DIAGNOSIS — E1165 Type 2 diabetes mellitus with hyperglycemia: Secondary | ICD-10-CM

## 2020-01-16 MED ORDER — METOPROLOL TARTRATE 50 MG PO TABS
50.0000 mg | ORAL_TABLET | Freq: Two times a day (BID) | ORAL | 3 refills | Status: DC
Start: 2020-01-16 — End: 2020-04-23

## 2020-01-16 NOTE — Progress Notes (Signed)
BP 134/78   Pulse 77   Temp 97.9 F (36.6 C)   Wt 200 lb 9.6 oz (91 kg)   SpO2 99%   BMI 32.38 kg/m    Subjective:    Patient ID: Shari Collins, female    DOB: 1976/05/13, 44 y.o.   MRN: [id number]  HPI: Shari Collins is a 44 y.o. female presenting on 01/16/2020 for No chief complaint on file.   HPI    Pt had a negative covid 19 screening questionnaire.   Pt is 24 yoF with DM, dyslipidemia who has appt for follow up today.  Pt c/o heart fluttering.  It is every day.  No CP.  She does get sob with the fluttering.   It lasts off and on all day. It only lasts a few seconds.    She drinks 2 or 3 sodas each day.  She denies use of street drugs.    She checks bs sometimes.   Usually around 150.        Relevant past medical, surgical, family and social history reviewed and updated as indicated. Interim medical history since our last visit reviewed. Allergies and medications reviewed and updated.   Current Outpatient Medications:  .  albuterol (VENTOLIN HFA) 108 (90 Base) MCG/ACT inhaler, Inhale 2 puffs into the lungs every 6 (six) hours as needed for wheezing or shortness of breath., Disp: 18 g, Rfl: 0 .  fenofibrate (TRICOR) 145 MG tablet, Take 1 tablet (145 mg total) by mouth daily., Disp: 30 tablet, Rfl: 4 .  Insulin Glargine (LANTUS SOLOSTAR) 100 UNIT/ML Solostar Pen, Inject 32 Units into the skin at bedtime., Disp: , Rfl:  .  lisinopril (ZESTRIL) 10 MG tablet, Take 1 tablet (10 mg total) by mouth daily., Disp: 90 tablet, Rfl: 1 .  metFORMIN (GLUCOPHAGE) 1000 MG tablet, TAKE 1 TABLET BY MOUTH TWICE DAILY WITH A MEAL, Disp: 180 tablet, Rfl: 1 .  atorvastatin (LIPITOR) 20 MG tablet, Take 1 tablet (20 mg total) by mouth daily. (Patient not taking: Reported on 11/21/2019), Disp: 90 tablet, Rfl: 1 .  omeprazole (PRILOSEC) 40 MG capsule, Take 1 capsule (40 mg total) by mouth daily. (Patient not taking: Reported on 01/16/2020), Disp: 90 capsule, Rfl: 1 .  triamcinolone cream  (KENALOG) 0.1 %, Apply 1 application topically 2 (two) times daily. (Patient not taking: Reported on 10/03/2019), Disp: 30 g, Rfl: 0   Review of Systems  Per HPI unless specifically indicated above     Objective:    BP 134/78   Pulse 77   Temp 97.9 F (36.6 C)   Wt 200 lb 9.6 oz (91 kg)   SpO2 99%   BMI 32.38 kg/m   Wt Readings from Last 3 Encounters:  01/16/20 200 lb 9.6 oz (91 kg)  06/29/19 202 lb (91.6 kg)  06/23/19 190 lb (86.2 kg)    Physical Exam Vitals and nursing note reviewed.  Constitutional:      General: She is not in acute distress.    Appearance: She is well-developed. She is not ill-appearing.  HENT:     Head: Normocephalic and atraumatic.  Cardiovascular:     Rate and Rhythm: Normal rate and regular rhythm.  Pulmonary:     Effort: Pulmonary effort is normal.     Breath sounds: Normal breath sounds.  Abdominal:     General: Bowel sounds are normal.     Palpations: Abdomen is soft. There is no mass.     Tenderness: There is no  abdominal tenderness. There is no guarding or rebound.  Genitourinary:    Labia:        Right: No rash, tenderness or lesion.        Left: No rash, tenderness or lesion.      Vagina: Normal.     Cervix: No cervical motion tenderness, discharge or friability.     Adnexa:        Right: No mass, tenderness or fullness.         Left: No mass, tenderness or fullness.       Comments: (nurse Berenice assisted) Musculoskeletal:     Cervical back: Neck supple.     Right lower leg: No edema.     Left lower leg: No edema.  Lymphadenopathy:     Cervical: No cervical adenopathy.  Skin:    General: Skin is warm and dry.  Neurological:     Mental Status: She is alert and oriented to person, place, and time.  Psychiatric:        Behavior: Behavior normal.     Results for orders placed or performed during the hospital encounter of 01/12/20  Lipid panel  Result Value Ref Range   Cholesterol 177 0 - 200 mg/dL   Triglycerides 104  <150 mg/dL   HDL 45 >40 mg/dL   Total CHOL/HDL Ratio 3.9 RATIO   VLDL 21 0 - 40 mg/dL   LDL Cholesterol 111 (H) 0 - 99 mg/dL  Comprehensive metabolic panel  Result Value Ref Range   Sodium 133 (L) 135 - 145 mmol/L   Potassium 4.6 3.5 - 5.1 mmol/L   Chloride 99 98 - 111 mmol/L   CO2 23 22 - 32 mmol/L   Glucose, Bld 143 (H) 70 - 99 mg/dL   BUN 18 6 - 20 mg/dL   Creatinine, Ser 0.72 0.44 - 1.00 mg/dL   Calcium 8.8 (L) 8.9 - 10.3 mg/dL   Total Protein 7.1 6.5 - 8.1 g/dL   Albumin 4.1 3.5 - 5.0 g/dL   AST 16 15 - 41 U/L   ALT 19 0 - 44 U/L   Alkaline Phosphatase 38 38 - 126 U/L   Total Bilirubin 0.7 0.3 - 1.2 mg/dL   GFR calc non Af Amer >60 >60 mL/min   GFR calc Af Amer >60 >60 mL/min   Anion gap 11 5 - 15  Hemoglobin A1c  Result Value Ref Range   Hgb A1c MFr Bld 7.5 (H) 4.8 - 5.6 %   Mean Plasma Glucose 168.55 mg/dL    EKG- sinus rhythm without st-t changes. No changes compared with previous EKG      Assessment & Plan:   Encounter Diagnoses  Name Primary?  . Palpitations Yes  . Hyperlipidemia, unspecified hyperlipidemia type   . Uncontrolled type 2 diabetes mellitus with complication, with long-term current use of insulin ([place])   . Tobacco use disorder   . Essential hypertension   . Routine Papanicolaou smear   . COVID-19 virus vaccination declined      -Reviewed labs with pt -Declined covid vaccination -pt to Avoid caffeine and will add low dose beta blocker -pt to follow up 3 months.  Pt to contact office to return sooner if her palpitaitons persist after addition of beta blocker and cessation of caffeine or if she develops new symtpoms or wosening

## 2020-01-18 ENCOUNTER — Telehealth: Payer: Self-pay | Admitting: Physician Assistant

## 2020-01-18 NOTE — Telephone Encounter (Signed)
Pt called to say that the metoprolol she was prescribed earlier this week is causing her to have dizziness and anxiety.  She was given the rx for palpitations.  She is counseled to discontinue the medication.  She is encouraged to stop caffeine as discussed.  If her palpitations persist, she should RTO for recheck.  Pt states understanding and agrees

## 2020-01-19 LAB — CYTOLOGY - PAP
Adequacy: ABSENT
Comment: NEGATIVE
Diagnosis: NEGATIVE
High risk HPV: NEGATIVE

## 2020-01-29 ENCOUNTER — Other Ambulatory Visit: Payer: Self-pay | Admitting: Physician Assistant

## 2020-03-01 ENCOUNTER — Other Ambulatory Visit: Payer: Self-pay | Admitting: Physician Assistant

## 2020-04-15 ENCOUNTER — Other Ambulatory Visit: Payer: Self-pay | Admitting: Physician Assistant

## 2020-04-15 DIAGNOSIS — E785 Hyperlipidemia, unspecified: Secondary | ICD-10-CM

## 2020-04-15 DIAGNOSIS — IMO0002 Reserved for concepts with insufficient information to code with codable children: Secondary | ICD-10-CM

## 2020-04-15 DIAGNOSIS — I10 Essential (primary) hypertension: Secondary | ICD-10-CM

## 2020-04-19 ENCOUNTER — Other Ambulatory Visit: Payer: Self-pay

## 2020-04-19 ENCOUNTER — Other Ambulatory Visit (HOSPITAL_COMMUNITY)
Admission: RE | Admit: 2020-04-19 | Discharge: 2020-04-19 | Disposition: A | Discharge status: Home / Self Care | Payer: Self-pay | Source: Ambulatory Visit | Attending: Physician Assistant | Admitting: Physician Assistant

## 2020-04-19 DIAGNOSIS — E118 Type 2 diabetes mellitus with unspecified complications: Secondary | ICD-10-CM | POA: Insufficient documentation

## 2020-04-19 DIAGNOSIS — IMO0002 Reserved for concepts with insufficient information to code with codable children: Secondary | ICD-10-CM

## 2020-04-19 DIAGNOSIS — E1165 Type 2 diabetes mellitus with hyperglycemia: Secondary | ICD-10-CM | POA: Insufficient documentation

## 2020-04-19 DIAGNOSIS — Z794 Long term (current) use of insulin: Secondary | ICD-10-CM | POA: Insufficient documentation

## 2020-04-19 DIAGNOSIS — I10 Essential (primary) hypertension: Secondary | ICD-10-CM | POA: Insufficient documentation

## 2020-04-19 DIAGNOSIS — E785 Hyperlipidemia, unspecified: Secondary | ICD-10-CM | POA: Insufficient documentation

## 2020-04-19 LAB — COMPREHENSIVE METABOLIC PANEL
ALT: 14 U/L (ref 0–44)
AST: 14 U/L — ABNORMAL LOW (ref 15–41)
Albumin: 3.8 g/dL (ref 3.5–5.0)
Alkaline Phosphatase: 36 U/L — ABNORMAL LOW (ref 38–126)
Anion gap: 9 (ref 5–15)
BUN: 11 mg/dL (ref 6–20)
CO2: 23 mmol/L (ref 22–32)
Calcium: 8.9 mg/dL (ref 8.9–10.3)
Chloride: 103 mmol/L (ref 98–111)
Creatinine, Ser: 0.67 mg/dL (ref 0.44–1.00)
GFR calc Af Amer: >60 mL/min (ref >60)
GFR calc non Af Amer: >60 mL/min (ref >60)
Glucose, Bld: 110 mg/dL — ABNORMAL HIGH (ref 70–99)
Potassium: 3.9 mmol/L (ref 3.5–5.1)
Sodium: 135 mmol/L (ref 135–145)
Total Bilirubin: 0.5 mg/dL (ref 0.3–1.2)
Total Protein: 6.8 g/dL (ref 6.5–8.1)

## 2020-04-19 LAB — HEMOGLOBIN A1C
Hgb A1c MFr Bld: 7.3 % — ABNORMAL HIGH (ref 4.8–5.6)
Mean Plasma Glucose: 162.81 mg/dL

## 2020-04-19 LAB — LIPID PANEL
Cholesterol: 177 mg/dL (ref 0–200)
HDL: 43 mg/dL (ref >40)
LDL Cholesterol: 118 mg/dL — ABNORMAL HIGH (ref 0–99)
Total CHOL/HDL Ratio: 4.1 RATIO
Triglycerides: 80 mg/dL (ref <150)
VLDL: 16 mg/dL (ref 0–40)

## 2020-04-20 ENCOUNTER — Other Ambulatory Visit: Payer: Self-pay | Admitting: Physician Assistant

## 2020-04-20 LAB — MICROALBUMIN, URINE: Microalb, Ur: <3 ug/mL — ABNORMAL HIGH

## 2020-04-23 ENCOUNTER — Ambulatory Visit: Payer: Self-pay | Admitting: Physician Assistant

## 2020-04-23 ENCOUNTER — Encounter: Payer: Self-pay | Admitting: Physician Assistant

## 2020-04-23 DIAGNOSIS — E118 Type 2 diabetes mellitus with unspecified complications: Secondary | ICD-10-CM

## 2020-04-23 DIAGNOSIS — R002 Palpitations: Secondary | ICD-10-CM

## 2020-04-23 DIAGNOSIS — F17200 Nicotine dependence, unspecified, uncomplicated: Secondary | ICD-10-CM

## 2020-04-23 DIAGNOSIS — I10 Essential (primary) hypertension: Secondary | ICD-10-CM

## 2020-04-23 DIAGNOSIS — E785 Hyperlipidemia, unspecified: Secondary | ICD-10-CM

## 2020-04-23 DIAGNOSIS — K219 Gastro-esophageal reflux disease without esophagitis: Secondary | ICD-10-CM

## 2020-04-23 MED ORDER — ATORVASTATIN CALCIUM 20 MG PO TABS: 20.0000 mg | ORAL_TABLET | Freq: Every day | ORAL | 4 refills | Status: DC

## 2020-04-23 MED ORDER — METFORMIN HCL 1000 MG PO TABS: 1000.0000 mg | ORAL_TABLET | Freq: Two times a day (BID) | ORAL | 4 refills | Status: DC

## 2020-04-23 MED ORDER — FENOFIBRATE 145 MG PO TABS: 145.0000 mg | ORAL_TABLET | Freq: Every day | ORAL | 4 refills | Status: DC

## 2020-04-23 MED ORDER — LISINOPRIL 10 MG PO TABS: 10.0000 mg | ORAL_TABLET | Freq: Every day | ORAL | 4 refills | Status: DC

## 2020-04-23 NOTE — Progress Notes (Signed)
There were no vitals taken for this visit.   Subjective:    Patient ID: Shari Collins, female    DOB: June 28, 1976, 44 y.o.   MRN: [id number]  HPI: Shari Collins is a 44 y.o. female presenting on 04/23/2020 for No chief complaint on file.   HPI  This is a telemedicine appointment due to coronavirus pandemic.  It is via telephone.  I connected with  Shari Collins on 04/23/20 by a video enabled telemedicine application and verified that I am speaking with the correct person using two identifiers.   I discussed the limitations of evaluation and management by telemedicine. The patient expressed understanding and agreed to proceed.  Pt is at home.  Provider is working from home office.   Pt is 43yoF DM, htn and dyslipidemia.    She is still having palpitations, "all the time".  She has cut out caffeine.  she was started on Metoprolol 50mg  bid at last appointment but pt says it didn't help and she stopped taking it after 3 days because it made her feel so bad/light-headed.  She says she gets sob with the palpitations.     Relevant past medical, surgical, family and social history reviewed and updated as indicated. Interim medical history since our last visit reviewed. Allergies and medications reviewed and updated.    Current Outpatient Medications:  .  albuterol (VENTOLIN HFA) 108 (90 Base) MCG/ACT inhaler, Inhale 2 puffs into the lungs every 6 (six) hours as needed for wheezing or shortness of breath., Disp: 18 g, Rfl: 0 .  fenofibrate (TRICOR) 145 MG tablet, Take 1 tablet by mouth once daily, Disp: 30 tablet, Rfl: 2 .  Insulin Glargine (LANTUS SOLOSTAR) 100 UNIT/ML Solostar Pen, Inject 32 Units into the skin at bedtime., Disp: , Rfl:  .  lisinopril (ZESTRIL) 10 MG tablet, Take 1 tablet by mouth once daily, Disp: 30 tablet, Rfl: 4 .  metFORMIN (GLUCOPHAGE) 1000 MG tablet, TAKE 1 TABLET BY MOUTH TWICE DAILY WITH A MEAL, Disp: 60 tablet, Rfl: 0 .  omeprazole (PRILOSEC) 40 MG  capsule, Take 1 capsule (40 mg total) by mouth daily., Disp: 90 capsule, Rfl: 1 .  triamcinolone cream (KENALOG) 0.1 %, Apply 1 application topically 2 (two) times daily., Disp: 30 g, Rfl: 0 .  atorvastatin (LIPITOR) 20 MG tablet, Take 1 tablet (20 mg total) by mouth daily. (Patient not taking: Reported on 04/23/2020), Disp: 90 tablet, Rfl: 1 .  metoprolol tartrate (LOPRESSOR) 50 MG tablet, Take 1 tablet (50 mg total) by mouth 2 (two) times daily. (Patient not taking: Reported on 04/23/2020), Disp: 60 tablet, Rfl: 3   Review of Systems  Per HPI unless specifically indicated above     Objective:    There were no vitals taken for this visit.  Wt Readings from Last 3 Encounters:  01/16/20 200 lb 9.6 oz (91 kg)  06/29/19 202 lb (91.6 kg)  06/23/19 190 lb (86.2 kg)    Physical Exam Pulmonary:     Effort: No respiratory distress.  Neurological:     Mental Status: She is alert and oriented to person, place, and time.  Psychiatric:        Attention and Perception: Attention normal.        Speech: Speech normal.        Behavior: Behavior is cooperative.     Results for orders placed or performed during the hospital encounter of 04/19/20  Lipid panel  Result Value Ref Range   Cholesterol 177  0 - 200 mg/dL   Triglycerides 80 <150 mg/dL   HDL 43 >40 mg/dL   Total CHOL/HDL Ratio 4.1 RATIO   VLDL 16 0 - 40 mg/dL   LDL Cholesterol 118 (H) 0 - 99 mg/dL  Microalbumin, urine  Result Value Ref Range   Microalb, Ur <3.0 (H) Not Estab. ug/mL  Hemoglobin A1c  Result Value Ref Range   Hgb A1c MFr Bld 7.3 (H) 4.8 - 5.6 %   Mean Plasma Glucose 162.81 mg/dL  Comprehensive metabolic panel  Result Value Ref Range   Sodium 135 135 - 145 mmol/L   Potassium 3.9 3.5 - 5.1 mmol/L   Chloride 103 98 - 111 mmol/L   CO2 23 22 - 32 mmol/L   Glucose, Bld 110 (H) 70 - 99 mg/dL   BUN 11 6 - 20 mg/dL   Creatinine, Ser 0.67 0.44 - 1.00 mg/dL   Calcium 8.9 8.9 - 10.3 mg/dL   Total Protein 6.8 6.5 - 8.1  g/dL   Albumin 3.8 3.5 - 5.0 g/dL   AST 14 (L) 15 - 41 U/L   ALT 14 0 - 44 U/L   Alkaline Phosphatase 36 (L) 38 - 126 U/L   Total Bilirubin 0.5 0.3 - 1.2 mg/dL   GFR calc non Af Amer >60 >60 mL/min   GFR calc Af Amer >60 >60 mL/min   Anion gap 9 5 - 15      Assessment & Plan:   Encounter Diagnoses  Name Primary?  . Palpitations Yes  . Controlled diabetes mellitus type 2 with complications, unspecified whether long term insulin use ([place])   . Essential hypertension   . Hyperlipidemia, unspecified hyperlipidemia type   . Tobacco use disorder   . Gastroesophageal reflux disease, unspecified whether esophagitis present      -reviewed labs with pt -Refer to cardiology for palpitations -pt to Get back on atorvastatin.   She is to continue other meds. -pt will stop by office and get [employer] financial application, medassist application, sample proair and her insulin. -pt to follow up in 3 months.  She is to contact office sooner prn

## 2020-05-22 ENCOUNTER — Other Ambulatory Visit: Payer: Self-pay

## 2020-05-22 ENCOUNTER — Emergency Department (HOSPITAL_COMMUNITY): Payer: Self-pay

## 2020-05-22 ENCOUNTER — Encounter (HOSPITAL_COMMUNITY): Payer: Self-pay

## 2020-05-22 ENCOUNTER — Emergency Department (HOSPITAL_COMMUNITY)
Admission: EM | Admit: 2020-05-22 | Discharge: 2020-05-22 | Disposition: A | Discharge status: Home / Self Care | Payer: Self-pay | Attending: Emergency Medicine | Admitting: Emergency Medicine

## 2020-05-22 DIAGNOSIS — M5412 Radiculopathy, cervical region: Secondary | ICD-10-CM | POA: Insufficient documentation

## 2020-05-22 DIAGNOSIS — F17210 Nicotine dependence, cigarettes, uncomplicated: Secondary | ICD-10-CM | POA: Insufficient documentation

## 2020-05-22 DIAGNOSIS — E11319 Type 2 diabetes mellitus with unspecified diabetic retinopathy without macular edema: Secondary | ICD-10-CM | POA: Insufficient documentation

## 2020-05-22 DIAGNOSIS — Z91040 Latex allergy status: Secondary | ICD-10-CM | POA: Insufficient documentation

## 2020-05-22 DIAGNOSIS — Z7952 Long term (current) use of systemic steroids: Secondary | ICD-10-CM | POA: Insufficient documentation

## 2020-05-22 DIAGNOSIS — M5410 Radiculopathy, site unspecified: Secondary | ICD-10-CM

## 2020-05-22 DIAGNOSIS — Z794 Long term (current) use of insulin: Secondary | ICD-10-CM | POA: Insufficient documentation

## 2020-05-22 DIAGNOSIS — I10 Essential (primary) hypertension: Secondary | ICD-10-CM | POA: Insufficient documentation

## 2020-05-22 DIAGNOSIS — Z79899 Other long term (current) drug therapy: Secondary | ICD-10-CM | POA: Insufficient documentation

## 2020-05-22 DIAGNOSIS — J45909 Unspecified asthma, uncomplicated: Secondary | ICD-10-CM | POA: Insufficient documentation

## 2020-05-22 MED ORDER — HYDROCODONE-ACETAMINOPHEN 5-325 MG PO TABS: 1.0000 | ORAL_TABLET | Freq: Four times a day (QID) | ORAL | 0 refills | Status: DC | PRN

## 2020-05-22 MED ORDER — PREDNISONE 10 MG PO TABS
20.0000 mg | ORAL_TABLET | Freq: Every day | ORAL | 0 refills | Status: DC
Start: 2020-05-22 — End: 2020-07-23

## 2020-05-22 NOTE — ED Notes (Signed)
Returned from xray

## 2020-05-22 NOTE — ED Provider Notes (Signed)
[facility] EMERGENCY DEPARTMENT Provider Note   CSN: [id number] Arrival date & time: 05/22/20  8676     History Chief Complaint  Patient presents with  . Neck Pain    Shari Collins is a 44 y.o. female.  Patient was lifting at work and now has pain in her right-sided neck going down her right arm  The history is provided by the patient and medical records. No language interpreter was used.  Neck Pain Pain location:  R side Quality:  Aching Pain radiates to:  R shoulder Pain severity:  Moderate Pain is:  Worse during the day Onset quality:  Sudden Timing:  Constant Progression:  Worsening Associated symptoms: no chest pain and no headaches        Past Medical History:  Diagnosis Date  . Asthma   . Depression   . Diabetes mellitus   . Hypertension   . Noncompliance     Patient Active Problem List   Diagnosis Date Noted  . Diabetic retinopathy ([place]) 09/14/2018  . Hyperlipidemia 10/07/2015  . Breast abscess 10/02/2015  . Schizophrenia ([place]) 10/02/2015  . Bipolar disorder, unspecified ([place]) 10/02/2015  . Uncontrolled type 2 diabetes mellitus with complication ([place]) 72/05/4708  . Personal history of noncompliance with medical treatment, presenting hazards to health 09/24/2015    Past Surgical History:  Procedure Laterality Date  . CESAREAN SECTION    . TONSILLECTOMY       OB History    Gravida  4   Para  1   Term  1   Preterm      AB  3   Living  1     SAB  3   TAB      Ectopic      Multiple      Live Births  1           Family History  Problem Relation Age of Onset  . Hypertension Paternal Grandmother   . Hypertension Maternal Grandmother   . Diabetes Father   . Hypertension Father   . Cancer Mother        liver  . Heart attack Paternal Grandfather   . Diabetes Brother     Social History   Tobacco Use  . Smoking status: Current Every Day Smoker    Packs/day: 0.50    Years: 20.00    Pack years: 10.00    Types:  Cigarettes  . Smokeless tobacco: Never Used  Vaping Use  . Vaping Use: Never used  Substance Use Topics  . Alcohol use: Not Currently    Comment: Weekend -- almost a 5th  . Drug use: No    Home Medications Prior to Admission medications   Medication Sig Start Date End Date Taking? Authorizing Provider  albuterol (VENTOLIN HFA) 108 (90 Base) MCG/ACT inhaler Inhale 2 puffs into the lungs every 6 (six) hours as needed for wheezing or shortness of breath. 11/09/19  Yes Soyla Dryer, PA-C  atorvastatin (LIPITOR) 20 MG tablet Take 1 tablet (20 mg total) by mouth daily. 04/23/20  Yes Soyla Dryer, PA-C  fenofibrate (TRICOR) 145 MG tablet Take 1 tablet (145 mg total) by mouth daily. 04/23/20  Yes Soyla Dryer, PA-C  Insulin Glargine (LANTUS SOLOSTAR) 100 UNIT/ML Solostar Pen Inject 32 Units into the skin at bedtime. 10/03/19  Yes Soyla Dryer, PA-C  lisinopril (ZESTRIL) 10 MG tablet Take 1 tablet (10 mg total) by mouth daily. 04/23/20  Yes Soyla Dryer, PA-C  metFORMIN (GLUCOPHAGE) 1000 MG tablet Take  1 tablet (1,000 mg total) by mouth 2 (two) times daily with a meal. 04/23/20  Yes Soyla Dryer, PA-C  HYDROcodone-acetaminophen (NORCO/VICODIN) 5-325 MG tablet Take 1 tablet by mouth every 6 (six) hours as needed. 05/22/20   Milton Ferguson, MD  omeprazole (PRILOSEC) 40 MG capsule Take 1 capsule (40 mg total) by mouth daily. Patient not taking: Reported on 05/22/2020 11/09/19   Soyla Dryer, PA-C  predniSONE (DELTASONE) 10 MG tablet Take 2 tablets (20 mg total) by mouth daily. 05/22/20   Milton Ferguson, MD  triamcinolone cream (KENALOG) 0.1 % Apply 1 application topically 2 (two) times daily. 01/03/18   Soyla Dryer, PA-C    Allergies    Bee venom, Buspar [buspirone], Latex, Reglan [metoclopramide], and Sulfa antibiotics  Review of Systems   Review of Systems  Constitutional: Negative for appetite change and fatigue.  HENT: Negative for congestion, ear discharge and sinus  pressure.   Eyes: Negative for discharge.  Respiratory: Negative for cough.   Cardiovascular: Negative for chest pain.  Gastrointestinal: Negative for abdominal pain and diarrhea.  Genitourinary: Negative for frequency and hematuria.  Musculoskeletal: Positive for neck pain. Negative for back pain.  Skin: Negative for rash.  Neurological: Negative for seizures and headaches.  Psychiatric/Behavioral: Negative for hallucinations.    Physical Exam Updated Vital Signs BP (!) 135/96 (BP Location: Left Arm)   Pulse (!) 59   Temp 98.7 F (37.1 C) (Oral)   Resp 18   Ht 5\' 6"  (1.676 m)   Wt 81.6 kg   LMP 05/10/2020   SpO2 100%   BMI 29.05 kg/m   Physical Exam Vitals reviewed.  Constitutional:      Appearance: She is well-developed.  HENT:     Head: Normocephalic.     Nose:     Comments: Tender right side of neck Eyes:     General: No scleral icterus.    Conjunctiva/sclera: Conjunctivae normal.  Neck:     Thyroid: No thyromegaly.  Cardiovascular:     Rate and Rhythm: Normal rate and regular rhythm.     Heart sounds: No murmur heard.  No friction rub. No gallop.   Pulmonary:     Breath sounds: No stridor. No wheezing or rales.  Chest:     Chest wall: No tenderness.  Abdominal:     General: There is no distension.     Tenderness: There is no abdominal tenderness. There is no rebound.  Musculoskeletal:        General: Normal range of motion.     Cervical back: Neck supple.     Comments: Neurovascular exam normal right arm  Lymphadenopathy:     Cervical: No cervical adenopathy.  Skin:    Findings: No erythema or rash.  Neurological:     Mental Status: She is alert and oriented to person, place, and time.     Motor: No abnormal muscle tone.     Coordination: Coordination normal.  Psychiatric:        Behavior: Behavior normal.     ED Results / Procedures / Treatments   Labs (all labs ordered are listed, but only abnormal results are displayed) Labs Reviewed - No  data to display  EKG None  Radiology DG Cervical Spine Complete  Result Date: 05/22/2020 CLINICAL DATA:  Neck pain after injury at work yesterday. EXAM: CERVICAL SPINE - COMPLETE 4+ VIEW COMPARISON:  None. FINDINGS: No fracture or spondylolisthesis is noted. Mild degenerative disc disease is noted at C4-5, C5-6 and C6-7 with anterior osteophyte  formation. No prevertebral soft tissue swelling is noted. No significant neural foraminal stenosis is noted. IMPRESSION: Mild multilevel degenerative disc disease. No acute abnormality seen in the cervical spine. Electronically Signed   By: Marijo Conception M.D.   On: 05/22/2020 08:36    Procedures Procedures (including critical care time)  Medications Ordered in ED Medications - No data to display  ED Course  I have reviewed the triage vital signs and the nursing notes.  Pertinent labs & imaging results that were available during my care of the patient were reviewed by me and considered in my medical decision making (see chart for details).    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  Patient has radiculopathy of the right arm.  We will put her on prednisone and give her pain medicine she will follow-up with her family doctor and neurosurgeon      This patient presents to the ED for concern of neck pain, this involves an extensive number of treatment options, and is a complaint that carries with it a high risk of complications and morbidity.  The differential diagnosis includes radiculopathy   Lab Tests:     Medicines ordered:   I ordered medication prednisone and Vicodin  Imaging Studies ordered:   I ordered imaging studies which included cervical spine series  I independently visualized and interpreted imaging which showed significant degenerative changes in the neck  Additional history obtained:   Additional history obtained from records  Previous records  obtained and reviewed.  Consultations Obtained:     Reevaluation:  After the interventions stated above, I reevaluated the patient and found mild improvement  Critical Interventions:  .   Final Clinical Impression(s) / ED Diagnoses Final diagnoses:  Radiculopathy, unspecified spinal region    Rx / DC Orders ED Discharge Orders         Ordered    predniSONE (DELTASONE) 10 MG tablet  Daily        05/22/20 1026    HYDROcodone-acetaminophen (NORCO/VICODIN) 5-325 MG tablet  Every 6 hours PRN        05/22/20 1026           Milton Ferguson, MD 05/22/20 1032

## 2020-05-22 NOTE — ED Triage Notes (Signed)
Pt c/o pain in r side of neck after lifting something over her head at work yesterday.  Reports pain radiates into r shoulder and r arm.

## 2020-05-22 NOTE — Discharge Instructions (Signed)
Follow-up with your family doctor next week and you have also been referred to a neurosurgeon Dr. Reatha Armour if you do not improve.    No lifting over 5 pounds with your right arm

## 2020-06-12 ENCOUNTER — Other Ambulatory Visit: Payer: Self-pay | Admitting: Physician Assistant

## 2020-06-12 MED ORDER — LISINOPRIL 10 MG PO TABS: 10.0000 mg | ORAL_TABLET | Freq: Every day | ORAL | 1 refills | Status: DC

## 2020-06-12 MED ORDER — METFORMIN HCL 1000 MG PO TABS: 1000.0000 mg | ORAL_TABLET | Freq: Two times a day (BID) | ORAL | 1 refills | Status: DC

## 2020-06-12 MED ORDER — ALBUTEROL SULFATE HFA 108 (90 BASE) MCG/ACT IN AERS: 2.0000 | INHALATION_SPRAY | Freq: Four times a day (QID) | RESPIRATORY_TRACT | 1 refills | Status: DC | PRN

## 2020-06-12 MED ORDER — ATORVASTATIN CALCIUM 20 MG PO TABS: 20.0000 mg | ORAL_TABLET | Freq: Every day | ORAL | 1 refills | Status: DC

## 2020-07-10 ENCOUNTER — Other Ambulatory Visit: Payer: Self-pay | Admitting: Physician Assistant

## 2020-07-10 DIAGNOSIS — I10 Essential (primary) hypertension: Secondary | ICD-10-CM

## 2020-07-10 DIAGNOSIS — IMO0002 Reserved for concepts with insufficient information to code with codable children: Secondary | ICD-10-CM

## 2020-07-10 DIAGNOSIS — E785 Hyperlipidemia, unspecified: Secondary | ICD-10-CM

## 2020-07-10 DIAGNOSIS — E1165 Type 2 diabetes mellitus with hyperglycemia: Secondary | ICD-10-CM

## 2020-07-17 ENCOUNTER — Other Ambulatory Visit (HOSPITAL_COMMUNITY)
Admission: RE | Admit: 2020-07-17 | Discharge: 2020-07-17 | Disposition: A | Discharge status: Home / Self Care | Payer: Self-pay | Source: Ambulatory Visit | Attending: Physician Assistant | Admitting: Physician Assistant

## 2020-07-17 ENCOUNTER — Other Ambulatory Visit: Payer: Self-pay

## 2020-07-17 DIAGNOSIS — IMO0002 Reserved for concepts with insufficient information to code with codable children: Secondary | ICD-10-CM

## 2020-07-17 DIAGNOSIS — E785 Hyperlipidemia, unspecified: Secondary | ICD-10-CM | POA: Insufficient documentation

## 2020-07-17 DIAGNOSIS — E118 Type 2 diabetes mellitus with unspecified complications: Secondary | ICD-10-CM | POA: Insufficient documentation

## 2020-07-17 DIAGNOSIS — E1165 Type 2 diabetes mellitus with hyperglycemia: Secondary | ICD-10-CM | POA: Insufficient documentation

## 2020-07-17 DIAGNOSIS — I10 Essential (primary) hypertension: Secondary | ICD-10-CM | POA: Insufficient documentation

## 2020-07-17 DIAGNOSIS — Z794 Long term (current) use of insulin: Secondary | ICD-10-CM | POA: Insufficient documentation

## 2020-07-17 LAB — LIPID PANEL
Cholesterol: 156 mg/dL (ref 0–200)
HDL: 30 mg/dL — ABNORMAL LOW (ref >40)
LDL Cholesterol: 53 mg/dL (ref 0–99)
Total CHOL/HDL Ratio: 5.2 RATIO
Triglycerides: 367 mg/dL — ABNORMAL HIGH (ref <150)
VLDL: 73 mg/dL — ABNORMAL HIGH (ref 0–40)

## 2020-07-17 LAB — COMPREHENSIVE METABOLIC PANEL
ALT: 15 U/L (ref 0–44)
AST: 11 U/L — ABNORMAL LOW (ref 15–41)
Albumin: 3.9 g/dL (ref 3.5–5.0)
Alkaline Phosphatase: 50 U/L (ref 38–126)
Anion gap: 7 (ref 5–15)
BUN: 17 mg/dL (ref 6–20)
CO2: 23 mmol/L (ref 22–32)
Calcium: 8.7 mg/dL — ABNORMAL LOW (ref 8.9–10.3)
Chloride: 103 mmol/L (ref 98–111)
Creatinine, Ser: 0.75 mg/dL (ref 0.44–1.00)
GFR, Estimated: >60 mL/min (ref >60)
Glucose, Bld: 197 mg/dL — ABNORMAL HIGH (ref 70–99)
Potassium: 4.6 mmol/L (ref 3.5–5.1)
Sodium: 133 mmol/L — ABNORMAL LOW (ref 135–145)
Total Bilirubin: 0.6 mg/dL (ref 0.3–1.2)
Total Protein: 7 g/dL (ref 6.5–8.1)

## 2020-07-17 LAB — HEMOGLOBIN A1C
Hgb A1c MFr Bld: 7.1 % — ABNORMAL HIGH (ref 4.8–5.6)
Mean Plasma Glucose: 157.07 mg/dL

## 2020-07-23 ENCOUNTER — Encounter: Payer: Self-pay | Admitting: Physician Assistant

## 2020-07-23 ENCOUNTER — Ambulatory Visit: Payer: Self-pay | Admitting: Physician Assistant

## 2020-07-23 DIAGNOSIS — I10 Essential (primary) hypertension: Secondary | ICD-10-CM

## 2020-07-23 DIAGNOSIS — J449 Chronic obstructive pulmonary disease, unspecified: Secondary | ICD-10-CM

## 2020-07-23 DIAGNOSIS — E785 Hyperlipidemia, unspecified: Secondary | ICD-10-CM

## 2020-07-23 DIAGNOSIS — E118 Type 2 diabetes mellitus with unspecified complications: Secondary | ICD-10-CM

## 2020-07-23 DIAGNOSIS — F17200 Nicotine dependence, unspecified, uncomplicated: Secondary | ICD-10-CM

## 2020-07-23 MED ORDER — ALBUTEROL SULFATE HFA 108 (90 BASE) MCG/ACT IN AERS: 2.0000 | INHALATION_SPRAY | Freq: Four times a day (QID) | RESPIRATORY_TRACT | 1 refills | Status: DC | PRN

## 2020-07-23 MED ORDER — ATORVASTATIN CALCIUM 20 MG PO TABS: 20.0000 mg | ORAL_TABLET | Freq: Every day | ORAL | 1 refills | Status: DC

## 2020-07-23 MED ORDER — FLUTICASONE-SALMETEROL 100-50 MCG/DOSE IN AEPB
1.0000 | INHALATION_SPRAY | Freq: Two times a day (BID) | RESPIRATORY_TRACT | 1 refills | Status: DC
Start: 2020-07-23 — End: 2020-09-26

## 2020-07-23 MED ORDER — FENOFIBRATE 145 MG PO TABS: 145.0000 mg | ORAL_TABLET | Freq: Every day | ORAL | 4 refills | Status: DC

## 2020-07-23 MED ORDER — OMEPRAZOLE 40 MG PO CPDR: 40.0000 mg | DELAYED_RELEASE_CAPSULE | Freq: Every day | ORAL | 1 refills | Status: DC

## 2020-07-23 MED ORDER — METFORMIN HCL 1000 MG PO TABS: 1000.0000 mg | ORAL_TABLET | Freq: Two times a day (BID) | ORAL | 1 refills | Status: DC

## 2020-07-23 MED ORDER — LISINOPRIL 10 MG PO TABS: 10.0000 mg | ORAL_TABLET | Freq: Every day | ORAL | 1 refills | Status: DC

## 2020-07-23 NOTE — Progress Notes (Signed)
There were no vitals taken for this visit.   Subjective:    Patient ID: Shari Collins, female    DOB: 03-21-1976, 44 y.o.   MRN: [id number]  HPI: Shari Collins is a 44 y.o. female presenting on 07/23/2020 for No chief complaint on file.   HPI  This is a telemedicine appointment through [employer] due to coronavirus pandemic.  I connected with  Shari Collins on 07/23/20 by a video enabled telemedicine application and verified that I am speaking with the correct person using two identifiers.   I discussed the limitations of evaluation and management by telemedicine. The patient expressed understanding and agreed to proceed.  Pt is at home.  Provider is at office.    Pt is 29yoF who was scheduled for in-office appointment today but she called yesterday stating she had a problem with transportation and requesting to change to virtual appointment.    She is Using inhaler 1 or 2 times/day.  She still works at [employer].   She says she is having no problems and is doing well.      Relevant past medical, surgical, family and social history reviewed and updated as indicated. Interim medical history since our last visit reviewed. Allergies and medications reviewed and updated.   Current Outpatient Medications:  .  albuterol (VENTOLIN HFA) 108 (90 Base) MCG/ACT inhaler, Inhale 2 puffs into the lungs every 6 (six) hours as needed for wheezing or shortness of breath., Disp: 3 each, Rfl: 1 .  atorvastatin (LIPITOR) 20 MG tablet, Take 1 tablet (20 mg total) by mouth daily., Disp: 90 tablet, Rfl: 1 .  Insulin Glargine (LANTUS SOLOSTAR) 100 UNIT/ML Solostar Pen, Inject 32 Units into the skin at bedtime., Disp: , Rfl:  .  lisinopril (ZESTRIL) 10 MG tablet, Take 1 tablet (10 mg total) by mouth daily., Disp: 90 tablet, Rfl: 1 .  metFORMIN (GLUCOPHAGE) 1000 MG tablet, Take 1 tablet (1,000 mg total) by mouth 2 (two) times daily with a meal., Disp: 90 tablet, Rfl: 1 .  omeprazole (PRILOSEC) 40  MG capsule, Take 1 capsule (40 mg total) by mouth daily., Disp: 90 capsule, Rfl: 1 .  triamcinolone cream (KENALOG) 0.1 %, Apply 1 application topically 2 (two) times daily., Disp: 30 g, Rfl: 0 .  fenofibrate (TRICOR) 145 MG tablet, Take 1 tablet (145 mg total) by mouth daily. (Patient not taking: Reported on 07/23/2020), Disp: 30 tablet, Rfl: 4 .  HYDROcodone-acetaminophen (NORCO/VICODIN) 5-325 MG tablet, Take 1 tablet by mouth every 6 (six) hours as needed. (Patient not taking: Reported on 07/23/2020), Disp: 20 tablet, Rfl: 0   Review of Systems  Per HPI unless specifically indicated above     Objective:    There were no vitals taken for this visit.  Wt Readings from Last 3 Encounters:  05/22/20 180 lb (81.6 kg)  01/16/20 200 lb 9.6 oz (91 kg)  06/29/19 202 lb (91.6 kg)    Physical Exam Constitutional:      General: She is not in acute distress.    Appearance: She is not ill-appearing.  HENT:     Head: Normocephalic and atraumatic.  Pulmonary:     Effort: No respiratory distress.  Neurological:     Mental Status: She is alert and oriented to person, place, and time.  Psychiatric:        Attention and Perception: Attention normal.        Mood and Affect: Mood normal.        Speech:  Speech normal.        Behavior: Behavior normal. Behavior is cooperative.     Results for orders placed or performed during the hospital encounter of 07/17/20  Hemoglobin A1c  Result Value Ref Range   Hgb A1c MFr Bld 7.1 (H) 4.8 - 5.6 %   Mean Plasma Glucose 157.07 mg/dL  Lipid panel  Result Value Ref Range   Cholesterol 156 0 - 200 mg/dL   Triglycerides 367 (H) <150 mg/dL   HDL 30 (L) >40 mg/dL   Total CHOL/HDL Ratio 5.2 RATIO   VLDL 73 (H) 0 - 40 mg/dL   LDL Cholesterol 53 0 - 99 mg/dL  Comprehensive metabolic panel  Result Value Ref Range   Sodium 133 (L) 135 - 145 mmol/L   Potassium 4.6 3.5 - 5.1 mmol/L   Chloride 103 98 - 111 mmol/L   CO2 23 22 - 32 mmol/L   Glucose, Bld 197  (H) 70 - 99 mg/dL   BUN 17 6 - 20 mg/dL   Creatinine, Ser 0.75 0.44 - 1.00 mg/dL   Calcium 8.7 (L) 8.9 - 10.3 mg/dL   Total Protein 7.0 6.5 - 8.1 g/dL   Albumin 3.9 3.5 - 5.0 g/dL   AST 11 (L) 15 - 41 U/L   ALT 15 0 - 44 U/L   Alkaline Phosphatase 50 38 - 126 U/L   Total Bilirubin 0.6 0.3 - 1.2 mg/dL   GFR, Estimated >60 >60 mL/min   Anion gap 7 5 - 15      Assessment & Plan:   Encounter Diagnoses  Name Primary?  . Controlled diabetes mellitus type 2 with complications, unspecified whether long term insulin use ([employer]) Yes  . Hyperlipidemia, unspecified hyperlipidemia type   . Essential hypertension   . Tobacco use disorder   . Chronic obstructive pulmonary disease, unspecified COPD type ([place])      -reviewed labs with pt -pt to Get back on fenofibrate -pt to continue other meds -will add advair discus for COPD.   -encouraged smoking cessation -pt is already on Dental list -refer for annual DM eye exam -pt to follow up 3 months.  Discussed with pt that next appointment will have to be in person.  She is to contact office sooner prn

## 2020-08-09 ENCOUNTER — Other Ambulatory Visit: Payer: Self-pay

## 2020-08-09 ENCOUNTER — Encounter: Payer: Self-pay | Admitting: Cardiology

## 2020-08-09 ENCOUNTER — Ambulatory Visit (INDEPENDENT_AMBULATORY_CARE_PROVIDER_SITE_OTHER): Payer: Self-pay | Admitting: Cardiology

## 2020-08-09 VITALS — BP 132/78 | HR 80 | Ht 66.0 in | Wt 201.4 lb

## 2020-08-09 DIAGNOSIS — R002 Palpitations: Secondary | ICD-10-CM

## 2020-08-09 NOTE — Patient Instructions (Addendum)
Medication Instructions:  Your physician recommends that you continue on your current medications as directed. Please refer to the Current Medication list given to you today.  *If you need a refill on your cardiac medications before your next appointment, please call your pharmacy*   Lab Work: None today  If you have labs (blood work) drawn today and your tests are completely normal, you will receive your results only by: Marland Kitchen MyChart Message (if you have MyChart) OR . A paper copy in the mail If you have any lab test that is abnormal or we need to change your treatment, we will call you to review the results.   Testing/Procedures: Your physician has recommended that you wear an event monitor (7 day ZIO) .THEY WILL CALL YOU TO DISCUSS PAYMENT ARRANGEMENTS  Event monitors are medical devices that record the heart's electrical activity. Doctors most often [place] these monitors to diagnose arrhythmias. Arrhythmias are problems with the speed or rhythm of the heartbeat. The monitor is a small, portable device. You can wear one while you do your normal daily activities. This is usually used to diagnose what is causing palpitations/syncope (passing out).     Follow-Up: At [facility], you and your health needs are our priority.  As part of our continuing mission to provide you with exceptional heart care, we have created designated Provider Care Teams.  These Care Teams include your primary Cardiologist (physician) and Advanced Practice Providers (APPs -  Physician Assistants and Nurse Practitioners) who all work together to provide you with the care you need, when you need it.  We recommend signing up for the patient portal called "MyChart".  Sign up information is provided on this After Visit Summary.  MyChart is used to connect with patients for Virtual Visits (Telemedicine).  Patients are able to view lab/test results, encounter notes, upcoming appointments, etc.  Non-urgent messages can be sent to  your provider as well.   To learn more about what you can do with MyChart, go to [email or web address].    Your next appointment:   2 month(s)  The format for your next appointment:   In Person  Provider:   You will see one of the following Advanced Practice Providers on your designated Care Team:    Bernerd Pho, PA-C   Ermalinda Barrios, PA-C       Other Instructions We have referred you to vascular surgery for your varicose veins. They will call you for an apt.      Thank you for choosing [facility] !

## 2020-08-09 NOTE — Progress Notes (Signed)
Clinical Summary Shari Collins is a 44 y.o.female seen as new consult for the following medical problems.  1. Palpitations - symptoms 1-2 years - fluttering in chest, can occur at any time. +SOB, can get dizzy - symptoms last few seconds, but can be recurrent. - increase in frequency, occurs daily  - 1 cup of coffee, mountain dews x 4, no tea, no energy drinks, no EtOH     Past Medical History:  Diagnosis Date  . Asthma   . Depression   . Diabetes mellitus   . Hypertension   . Noncompliance      Allergies  Allergen Reactions  . Bee Venom Anaphylaxis and Swelling  . Buspar [Buspirone] Itching and Anxiety    Dizziness, burning sensation, pupils dilated  . Latex Itching  . Reglan [Metoclopramide]     Itching, hives  . Sulfa Antibiotics Itching and Rash     Current Outpatient Medications  Medication Sig Dispense Refill  . albuterol (VENTOLIN HFA) 108 (90 Base) MCG/ACT inhaler Inhale 2 puffs into the lungs every 6 (six) hours as needed for wheezing or shortness of breath. 3 each 1  . atorvastatin (LIPITOR) 20 MG tablet Take 1 tablet (20 mg total) by mouth daily. 90 tablet 1  . fenofibrate (TRICOR) 145 MG tablet Take 1 tablet (145 mg total) by mouth daily. 30 tablet 4  . Fluticasone-Salmeterol (ADVAIR DISKUS) 100-50 MCG/DOSE AEPB Inhale 1 puff into the lungs in the morning and at bedtime. 60 each 1  . Insulin Glargine (LANTUS SOLOSTAR) 100 UNIT/ML Solostar Pen Inject 32 Units into the skin at bedtime.    Marland Kitchen lisinopril (ZESTRIL) 10 MG tablet Take 1 tablet (10 mg total) by mouth daily. 90 tablet 1  . metFORMIN (GLUCOPHAGE) 1000 MG tablet Take 1 tablet (1,000 mg total) by mouth 2 (two) times daily with a meal. 90 tablet 1  . omeprazole (PRILOSEC) 40 MG capsule Take 1 capsule (40 mg total) by mouth daily. 90 capsule 1  . triamcinolone cream (KENALOG) 0.1 % Apply 1 application topically 2 (two) times daily. 30 g 0   No current facility-administered medications for this  visit.     Past Surgical History:  Procedure Laterality Date  . CESAREAN SECTION    . TONSILLECTOMY       Allergies  Allergen Reactions  . Bee Venom Anaphylaxis and Swelling  . Buspar [Buspirone] Itching and Anxiety    Dizziness, burning sensation, pupils dilated  . Latex Itching  . Reglan [Metoclopramide]     Itching, hives  . Sulfa Antibiotics Itching and Rash      Family History  Problem Relation Age of Onset  . Hypertension Paternal Grandmother   . Hypertension Maternal Grandmother   . Diabetes Father   . Hypertension Father   . Cancer Mother        liver  . Heart attack Paternal Grandfather   . Diabetes Brother      Social History Shari Collins reports that she has been smoking cigarettes. She has a 10.00 pack-year smoking history. She has never used smokeless tobacco. Shari Collins reports previous alcohol use.   Review of Systems CONSTITUTIONAL: No weight loss, fever, chills, weakness or fatigue.  HEENT: Eyes: No visual loss, blurred vision, double vision or yellow sclerae.No hearing loss, sneezing, congestion, runny nose or sore throat.  SKIN: No rash or itching.  CARDIOVASCULAR: per hpi RESPIRATORY: No shortness of breath, cough or sputum.  GASTROINTESTINAL: No anorexia, nausea, vomiting or diarrhea. No abdominal pain  or blood.  GENITOURINARY: No burning on urination, no polyuria NEUROLOGICAL: No headache, dizziness, syncope, paralysis, ataxia, numbness or tingling in the extremities. No change in bowel or bladder control.  MUSCULOSKELETAL: No muscle, back pain, joint pain or stiffness.  LYMPHATICS: No enlarged nodes. No history of splenectomy.  PSYCHIATRIC: No history of depression or anxiety.  ENDOCRINOLOGIC: No reports of sweating, cold or heat intolerance. No polyuria or polydipsia.  Marland Kitchen   Physical Examination Today's Vitals   08/09/20 1323  BP: 132/78  Pulse: 80  SpO2: 98%  Weight: 201 lb 6.4 oz (91.4 kg)  Height: 5\' 6"  (1.676 m)   Body mass  index is 32.51 kg/m.  Gen: resting comfortably, no acute distress HEENT: no scleral icterus, pupils equal round and reactive, no palptable cervical adenopathy,  CV: RRR, no m/r/g, no jvd Resp: Clear to auscultation bilaterally GI: abdomen is soft, non-tender, non-distended, normal bowel sounds, no hepatosplenomegaly MSK: extremities are warm, no edema.  Skin: warm, no rash Neuro:  no focal deficits Psych: appropriate affect     Assessment and Plan  1. Palpitations - obtain 7 day zio patch to further evaluate - baseline EKG shows SR  2. Varicose veins - large varicose veins left leg, can be painful - refer to vascular      Arnoldo Lenis, M.D.

## 2020-08-12 ENCOUNTER — Ambulatory Visit (INDEPENDENT_AMBULATORY_CARE_PROVIDER_SITE_OTHER): Payer: Self-pay

## 2020-08-12 DIAGNOSIS — R002 Palpitations: Secondary | ICD-10-CM

## 2020-09-02 ENCOUNTER — Other Ambulatory Visit: Payer: Self-pay

## 2020-09-18 ENCOUNTER — Other Ambulatory Visit (HOSPITAL_COMMUNITY)
Admission: RE | Admit: 2020-09-18 | Discharge: 2020-09-18 | Disposition: A | Discharge status: Home / Self Care | Payer: Self-pay | Source: Ambulatory Visit | Attending: Physician Assistant | Admitting: Physician Assistant

## 2020-09-18 ENCOUNTER — Ambulatory Visit: Payer: Self-pay | Admitting: Physician Assistant

## 2020-09-18 ENCOUNTER — Encounter: Payer: Self-pay | Admitting: Physician Assistant

## 2020-09-18 VITALS — BP 120/76 | HR 75 | Temp 98.0°F | Ht 66.0 in | Wt 192.6 lb

## 2020-09-18 DIAGNOSIS — R935 Abnormal findings on diagnostic imaging of other abdominal regions, including retroperitoneum: Secondary | ICD-10-CM | POA: Insufficient documentation

## 2020-09-18 DIAGNOSIS — R1084 Generalized abdominal pain: Secondary | ICD-10-CM | POA: Insufficient documentation

## 2020-09-18 LAB — CBC WITH DIFFERENTIAL/PLATELET
Abs Immature Granulocytes: 0.02 10*3/uL (ref 0.00–0.07)
Basophils Absolute: 0.1 10*3/uL (ref 0.0–0.1)
Basophils Relative: 1 %
Eosinophils Absolute: 1.1 10*3/uL — ABNORMAL HIGH (ref 0.0–0.5)
Eosinophils Relative: 11 %
HCT: 40.1 % (ref 36.0–46.0)
Hemoglobin: 13.2 g/dL (ref 12.0–15.0)
Immature Granulocytes: 0 %
Lymphocytes Relative: 25 %
Lymphs Abs: 2.3 10*3/uL (ref 0.7–4.0)
MCH: 30.2 pg (ref 26.0–34.0)
MCHC: 32.9 g/dL (ref 30.0–36.0)
MCV: 91.8 fL (ref 80.0–100.0)
Monocytes Absolute: 0.5 10*3/uL (ref 0.1–1.0)
Monocytes Relative: 5 %
Neutro Abs: 5.5 10*3/uL (ref 1.7–7.7)
Neutrophils Relative %: 58 %
Platelets: 379 10*3/uL (ref 150–400)
RBC: 4.37 MIL/uL (ref 3.87–5.11)
RDW: 13.9 % (ref 11.5–15.5)
WBC: 9.5 10*3/uL (ref 4.0–10.5)
nRBC: 0 % (ref 0.0–0.2)

## 2020-09-18 LAB — COMPREHENSIVE METABOLIC PANEL
ALT: 13 U/L (ref 0–44)
AST: 16 U/L (ref 15–41)
Albumin: 4.2 g/dL (ref 3.5–5.0)
Alkaline Phosphatase: 41 U/L (ref 38–126)
Anion gap: 8 (ref 5–15)
BUN: 16 mg/dL (ref 6–20)
CO2: 24 mmol/L (ref 22–32)
Calcium: 8.8 mg/dL — ABNORMAL LOW (ref 8.9–10.3)
Chloride: 104 mmol/L (ref 98–111)
Creatinine, Ser: 0.65 mg/dL (ref 0.44–1.00)
GFR, Estimated: >60 mL/min (ref >60)
Glucose, Bld: 110 mg/dL — ABNORMAL HIGH (ref 70–99)
Potassium: 4.5 mmol/L (ref 3.5–5.1)
Sodium: 136 mmol/L (ref 135–145)
Total Bilirubin: 0.4 mg/dL (ref 0.3–1.2)
Total Protein: 7.2 g/dL (ref 6.5–8.1)

## 2020-09-18 LAB — POCT URINALYSIS DIPSTICK
Bilirubin, UA: NEGATIVE
Blood, UA: NEGATIVE
Glucose, UA: NEGATIVE
Ketones, UA: NEGATIVE
Leukocytes, UA: NEGATIVE
Nitrite, UA: NEGATIVE
Protein, UA: NEGATIVE
Spec Grav, UA: >=1.03 — AB (ref 1.010–1.025)
Urobilinogen, UA: 2 E.U./dL — AB
pH, UA: 5.5 (ref 5.0–8.0)

## 2020-09-18 LAB — AMYLASE: Amylase: 26 U/L — ABNORMAL LOW (ref 28–100)

## 2020-09-18 MED ORDER — DICYCLOMINE HCL 10 MG PO CAPS: 10.0000 mg | ORAL_CAPSULE | Freq: Three times a day (TID) | ORAL | 2 refills | Status: DC

## 2020-09-18 MED ORDER — TRIAMCINOLONE ACETONIDE 0.1 % EX CREA
1.0000 | TOPICAL_CREAM | Freq: Two times a day (BID) | CUTANEOUS | 0 refills | Status: DC
Start: 2020-09-18 — End: 2020-10-30

## 2020-09-18 NOTE — Progress Notes (Unsigned)
BP 120/76   Pulse 75   Temp 98 F (36.7 C)   Ht 5\' 6"  (1.676 m)   Wt 192 lb 9.6 oz (87.4 kg)   SpO2 96%   BMI 31.09 kg/m    Subjective:    Patient ID: Shari Collins, female    DOB: 27-Nov-1975, 45 y.o.   MRN: [id number]  HPI: Shari Collins is a 45 y.o. female presenting on 09/18/2020 for Flank Pain (L side pain for about one month. Pt states it was intermittnt but has become constant for the past week. Pt reports pain when lying down, sitting, standing, walking. Pt states when she leans over eases the pain. Pt has taken ibu for the pain but has not helped. Pt reports normal BM and no pain when urinating. Pt states it is pain is like she is being hit with a rubber band.)   HPI    Pt had a negative covid 19 screening questionnaire.  Chief Complaint  Patient presents with  . Flank Pain    L side pain for about one month. Pt states it was intermittnt but has become constant for the past week. Pt reports pain when lying down, sitting, standing, walking. Pt states when she leans over eases the pain. Pt has taken ibu for the pain but has not helped. Pt reports normal BM and no pain when urinating. Pt states it is pain is like she is being hit with a rubber band.     Eating doesn't change her pain.  She has some nausea at times but no emesis or diarrhea.  She says her BMs are normal and regular.    No previous abdominal surgery except c-section  Review of previous abdominal imaging reveals CT done January 2018 that had some abnormal adrenal masses.  She was not a patient with [facility] at that time.     Relevant past medical, surgical, family and social history reviewed and updated as indicated. Interim medical history since our last visit reviewed. Allergies and medications reviewed and updated.    Current Outpatient Medications:  .  albuterol (VENTOLIN HFA) 108 (90 Base) MCG/ACT inhaler, Inhale 2 puffs into the lungs every 6 (six) hours as needed for wheezing or shortness of  breath., Disp: 3 each, Rfl: 1 .  atorvastatin (LIPITOR) 20 MG tablet, Take 1 tablet (20 mg total) by mouth daily., Disp: 90 tablet, Rfl: 1 .  fenofibrate (TRICOR) 145 MG tablet, Take 1 tablet (145 mg total) by mouth daily., Disp: 30 tablet, Rfl: 4 .  Fluticasone-Salmeterol (ADVAIR DISKUS) 100-50 MCG/DOSE AEPB, Inhale 1 puff into the lungs in the morning and at bedtime., Disp: 60 each, Rfl: 1 .  Insulin Glargine (LANTUS SOLOSTAR) 100 UNIT/ML Solostar Pen, Inject 32 Units into the skin at bedtime., Disp: , Rfl:  .  lisinopril (ZESTRIL) 10 MG tablet, Take 1 tablet (10 mg total) by mouth daily., Disp: 90 tablet, Rfl: 1 .  metFORMIN (GLUCOPHAGE) 1000 MG tablet, Take 1 tablet (1,000 mg total) by mouth 2 (two) times daily with a meal., Disp: 90 tablet, Rfl: 1 .  omeprazole (PRILOSEC) 40 MG capsule, Take 1 capsule (40 mg total) by mouth daily., Disp: 90 capsule, Rfl: 1 .  triamcinolone cream (KENALOG) 0.1 %, Apply 1 application topically 2 (two) times daily. (Patient not taking: Reported on 09/18/2020), Disp: 30 g, Rfl: 0      Review of Systems  Per HPI unless specifically indicated above     Objective:    BP  120/76   Pulse 75   Temp 98 F (36.7 C)   Ht 5\' 6"  (1.676 m)   Wt 192 lb 9.6 oz (87.4 kg)   SpO2 96%   BMI 31.09 kg/m   Wt Readings from Last 3 Encounters:  09/18/20 192 lb 9.6 oz (87.4 kg)  08/09/20 201 lb 6.4 oz (91.4 kg)  05/22/20 180 lb (81.6 kg)    Physical Exam Constitutional:      General: She is not in acute distress.    Appearance: She is not toxic-appearing.  HENT:     Head: Normocephalic and atraumatic.  Cardiovascular:     Rate and Rhythm: Normal rate and regular rhythm.  Pulmonary:     Effort: Pulmonary effort is normal. No respiratory distress.  Abdominal:     General: Bowel sounds are normal.     Palpations: Abdomen is soft. There is no fluid wave, mass or pulsatile mass.     Tenderness: There is generalized abdominal tenderness. There is no guarding or  rebound.  Musculoskeletal:     Right lower leg: No edema.     Left lower leg: No edema.  Neurological:     Mental Status: She is alert and oriented to person, place, and time.            Assessment & Plan:   Encounter Diagnoses  Name Primary?  . Generalized abdominal pain Yes  . Abnormal CT of the abdomen      -UA unremarkable -Check labs -Order CT abd pelvis -rx bentyl -pt counseled to notify office for worsening or new symptoms

## 2020-09-18 NOTE — Patient Instructions (Signed)
Instructions for CT on 10-09-20 at 5pm (arrival time 4:45PM) -nothing to eat or drink 4 hours before your appointment (after 1PM) -pick up contrast at [facility] no later than 24 hours before appointment date

## 2020-09-26 ENCOUNTER — Other Ambulatory Visit: Payer: Self-pay | Admitting: Physician Assistant

## 2020-10-02 NOTE — Progress Notes (Deleted)
Cardiology Office Note    Date:  10/02/2020   ID:  Shari Collins, DOB 1975/12/23, MRN [id number]  PCP:  Soyla Dryer, PA-C  Cardiologist: Carlyle Dolly, MD EPS: None  No chief complaint on file.   History of Present Illness:  Shari Collins is a 45 y.o. female who saw Dr. Harl Bowie for the first time 08/09/2020 with palpitations lasting a few seconds and associated with shortness of breath.  She was drinking 1 cup of coffee and 4 Mountain Dew's daily.  7-day monitor showed PACs, PVCs and SVT 12-second run was the longest consistent with atrial tachycardia   Past Medical History:  Diagnosis Date  . Asthma   . Depression   . Diabetes mellitus   . Hypertension   . Noncompliance     Past Surgical History:  Procedure Laterality Date  . CESAREAN SECTION    . TONSILLECTOMY      Current Medications: No outpatient medications have been marked as taking for the 10/14/20 encounter (Appointment) with Imogene Burn, PA-C.     Allergies:   Bee venom, Buspar [buspirone], Latex, Reglan [metoclopramide], and Sulfa antibiotics   Social History   Socioeconomic History  . Marital status: Single    Spouse name: Not on file  . Number of children: Not on file  . Years of education: Not on file  . Highest education level: Not on file  Occupational History  . Not on file  Tobacco Use  . Smoking status: Current Every Day Smoker    Packs/day: 0.50    Years: 20.00    Pack years: 10.00    Types: Cigarettes  . Smokeless tobacco: Never Used  Vaping Use  . Vaping Use: Never used  Substance and Sexual Activity  . Alcohol use: Not Currently    Comment: Weekend -- almost a 5th  . Drug use: No  . Sexual activity: Yes    Birth control/protection: None  Other Topics Concern  . Not on file  Social History Narrative  . Not on file   Social Determinants of Health   Financial Resource Strain: Not on file  Food Insecurity: Not on file  Transportation Needs: Not on file   Physical Activity: Not on file  Stress: Not on file  Social Connections: Not on file     Family History:  The patient's ***family history includes Cancer in her mother; Diabetes in her brother and father; Heart attack in her paternal grandfather; Hypertension in her father, maternal grandmother, and paternal grandmother.   ROS:   Please see the history of present illness.    ROS All other systems reviewed and are negative.   PHYSICAL EXAM:   VS:  There were no vitals taken for this visit.  Physical Exam  GEN: Well nourished, well developed, in no acute distress  HEENT: normal  Neck: no JVD, carotid bruits, or masses Cardiac:RRR; no murmurs, rubs, or gallops  Respiratory:  clear to auscultation bilaterally, normal work of breathing GI: soft, nontender, nondistended, + BS Ext: without cyanosis, clubbing, or edema, Good distal pulses bilaterally MS: no deformity or atrophy  Skin: warm and dry, no rash Neuro:  Alert and Oriented x 3, Strength and sensation are intact Psych: euthymic mood, full affect  Wt Readings from Last 3 Encounters:  09/18/20 192 lb 9.6 oz (87.4 kg)  08/09/20 201 lb 6.4 oz (91.4 kg)  05/22/20 180 lb (81.6 kg)      Studies/Labs Reviewed:   EKG:  EKG is*** ordered today.  The ekg ordered today demonstrates ***  Recent Labs: 09/18/2020: ALT 13; BUN 16; Creatinine, Ser 0.65; Hemoglobin 13.2; Platelets 379; Potassium 4.5; Sodium 136   Lipid Panel    Component Value Date/Time   CHOL 156 07/17/2020 0837   TRIG 367 (H) 07/17/2020 0837   HDL 30 (L) 07/17/2020 0837   CHOLHDL 5.2 07/17/2020 0837   VLDL 73 (H) 07/17/2020 0837   LDLCALC 53 07/17/2020 0837    Additional studies/ records that were reviewed today include:  7 days monitor Study Highlights   5 days 19 hours monitor  Min HR 49, Max HR 150, Avg HR 68  There were patient triggered events but no symptoms reported  Rare supraventricular ectopy in the form of isolated PACs and couplets.Rare  episodes of SVT lasting up to 12 seconds. SVT episodes consistent with atrial tachycardia  Rare ventricular ectopy in the form of isolated PVCs and couplets      Risk Assessment/Calculations:   {Does this patient have ATRIAL FIBRILLATION?:[phone]}     ASSESSMENT:    No diagnosis found.   PLAN:  In order of problems listed above:  Palpitations 7-day monitor rare disease and couplets, rare episode of SVT lasting up to 12 seconds consistent with atrial tachycardia and rare PVCs and couplets  Varicose veins referred to vascular   Shared Decision Making/Informed Consent   {Are you ordering a CV Procedure (e.g. stress test, cath, DCCV, TEE, etc)?   Press F2        :[id number]}    Medication Adjustments/Labs and Tests Ordered: Current medicines are reviewed at length with the patient today.  Concerns regarding medicines are outlined above.  Medication changes, Labs and Tests ordered today are listed in the Patient Instructions below. There are no Patient Instructions on file for this visit.   Sumner Boast, PA-C  10/02/2020 11:44 AM    [facility] [address], [place], [place]  [address] Phone: [phone]; Fax: [phone]

## 2020-10-09 ENCOUNTER — Ambulatory Visit (HOSPITAL_COMMUNITY): Payer: Self-pay

## 2020-10-10 ENCOUNTER — Encounter: Payer: Self-pay | Admitting: Physician Assistant

## 2020-10-10 NOTE — Progress Notes (Addendum)
Pt called 10-10-20 stating she got tested for COVID at [facility] on Sunday,  10-06-20 and tested POSITIVE. Pt states she woke up on Friday, 10-04-20 with body aches, then on Saturday she had sub. fever, sore throat, chest and nasal congestion, and cough. Pt states most symptoms are resolved, but is still having chest and nasal congestion and cough. Pt has been taking Alka Seltzer cold and flu and Mucinex syrup. Pt is requesting a work note.  Instructions given to pt: " remain in self-quarantine until they meet the "Non-Test Criteria for Ending Self-Isolation". Non-Test Criteria for Ending Self-Isolation All persons with fever and respiratory symptoms should isolate themselves until ALL conditions listed below are met: " at least 10 days since symptoms onset " AND 3 consecutive days fever free without antipyretics (acetaminophen [Tylenol] or ibuprofen [Advil]) " AND improvement in respiratory symptoms " If the patient develops respiratory issues/distress, seek medical care in the Emergency Department, call 911, reports symptoms and report COVID-19 positive test. Patient Instructions " continue to utilize over-the-counter medications for fever (ibuprofen and/or Tylenol) and cough (cough medicine and/or sore throat lozenges). " wear a mask around people and follow good infection prevention techniques. " Patient to should only leave home to seek medical care. " send family for food, prescriptions or medicines; or use delivery service.  " If the patient must leave the home, they MUST wear a mask in public. " limit contact with immediate family members or caregivers in the home, and use mask, social distancing, and handwashing to decrease risk to patients. o Please continue good preventive care measures, including frequent hand washing, avoid touching your face, cover coughs/sneezes with tissue or into elbow, stay out of crowds and keep a 6-foot distance from others.   " patient and family to clean hard  surfaces touched by patient frequently with household cleaning products. Pt verbalized understanding.  Pt's COVID result was received and will be scanned into patient's chart. Work note sent to her email on file.

## 2020-10-14 ENCOUNTER — Telehealth: Payer: Self-pay | Admitting: *Deleted

## 2020-10-14 ENCOUNTER — Encounter: Payer: Self-pay | Admitting: Physician Assistant

## 2020-10-14 ENCOUNTER — Ambulatory Visit: Payer: Self-pay | Admitting: Physician Assistant

## 2020-10-14 NOTE — Telephone Encounter (Signed)
-----   Message from Arnoldo Lenis, MD sent at 10/14/2020 11:05 AM EST ----- Heart monitor showed some occasional extra heart beats, at times could have a few in a row. This is considered benign but can cause the feeling of heart racing or fluttering. If no significant symptoms don't have to do anything, if ongoing symptoms could try a medication to help  J BrancH MD

## 2020-10-14 NOTE — Telephone Encounter (Signed)
Pt voiced understanding and c/o feeling of fluttering/palpitations - would like to try medication

## 2020-10-15 MED ORDER — METOPROLOL TARTRATE 25 MG PO TABS: 12.5000 mg | ORAL_TABLET | Freq: Two times a day (BID) | ORAL | 1 refills | Status: DC

## 2020-10-15 NOTE — Telephone Encounter (Signed)
Can she start lopressor 12.5mg  bid and update [place] on symptoms in 1 week   Zandra Abts MD

## 2020-10-15 NOTE — Telephone Encounter (Signed)
Pt voiced understanding - Medication sent to pharmacy ° °

## 2020-10-17 ENCOUNTER — Other Ambulatory Visit: Payer: Self-pay | Admitting: Physician Assistant

## 2020-10-17 DIAGNOSIS — E118 Type 2 diabetes mellitus with unspecified complications: Secondary | ICD-10-CM

## 2020-10-17 DIAGNOSIS — E785 Hyperlipidemia, unspecified: Secondary | ICD-10-CM

## 2020-10-17 DIAGNOSIS — I10 Essential (primary) hypertension: Secondary | ICD-10-CM

## 2020-10-29 ENCOUNTER — Other Ambulatory Visit (HOSPITAL_COMMUNITY)
Admission: RE | Admit: 2020-10-29 | Discharge: 2020-10-29 | Disposition: A | Discharge status: Home / Self Care | Payer: Self-pay | Source: Ambulatory Visit | Attending: Physician Assistant | Admitting: Physician Assistant

## 2020-10-29 ENCOUNTER — Other Ambulatory Visit: Payer: Self-pay

## 2020-10-29 DIAGNOSIS — I10 Essential (primary) hypertension: Secondary | ICD-10-CM | POA: Insufficient documentation

## 2020-10-29 DIAGNOSIS — E785 Hyperlipidemia, unspecified: Secondary | ICD-10-CM | POA: Insufficient documentation

## 2020-10-29 DIAGNOSIS — E118 Type 2 diabetes mellitus with unspecified complications: Secondary | ICD-10-CM | POA: Insufficient documentation

## 2020-10-29 LAB — COMPREHENSIVE METABOLIC PANEL
ALT: 17 U/L (ref 0–44)
AST: 16 U/L (ref 15–41)
Albumin: 3.8 g/dL (ref 3.5–5.0)
Alkaline Phosphatase: 35 U/L — ABNORMAL LOW (ref 38–126)
Anion gap: 6 (ref 5–15)
BUN: 17 mg/dL (ref 6–20)
CO2: 23 mmol/L (ref 22–32)
Calcium: 8.6 mg/dL — ABNORMAL LOW (ref 8.9–10.3)
Chloride: 104 mmol/L (ref 98–111)
Creatinine, Ser: 0.8 mg/dL (ref 0.44–1.00)
GFR, Estimated: >60 mL/min (ref >60)
Glucose, Bld: 99 mg/dL (ref 70–99)
Potassium: 4.1 mmol/L (ref 3.5–5.1)
Sodium: 133 mmol/L — ABNORMAL LOW (ref 135–145)
Total Bilirubin: 0.3 mg/dL (ref 0.3–1.2)
Total Protein: 6.5 g/dL (ref 6.5–8.1)

## 2020-10-29 LAB — LIPID PANEL
Cholesterol: 137 mg/dL (ref 0–200)
HDL: 42 mg/dL (ref >40)
LDL Cholesterol: 68 mg/dL (ref 0–99)
Total CHOL/HDL Ratio: 3.3 RATIO
Triglycerides: 133 mg/dL (ref <150)
VLDL: 27 mg/dL (ref 0–40)

## 2020-10-29 LAB — HEMOGLOBIN A1C
Hgb A1c MFr Bld: 7.2 % — ABNORMAL HIGH (ref 4.8–5.6)
Mean Plasma Glucose: 159.94 mg/dL

## 2020-10-30 ENCOUNTER — Encounter: Payer: Self-pay | Admitting: Physician Assistant

## 2020-10-30 ENCOUNTER — Ambulatory Visit: Payer: Self-pay | Admitting: Physician Assistant

## 2020-10-30 ENCOUNTER — Other Ambulatory Visit: Payer: Self-pay

## 2020-10-30 VITALS — BP 131/73 | HR 61 | Temp 98.2°F

## 2020-10-30 DIAGNOSIS — Z1239 Encounter for other screening for malignant neoplasm of breast: Secondary | ICD-10-CM

## 2020-10-30 DIAGNOSIS — J449 Chronic obstructive pulmonary disease, unspecified: Secondary | ICD-10-CM

## 2020-10-30 DIAGNOSIS — I10 Essential (primary) hypertension: Secondary | ICD-10-CM

## 2020-10-30 DIAGNOSIS — F17200 Nicotine dependence, unspecified, uncomplicated: Secondary | ICD-10-CM

## 2020-10-30 DIAGNOSIS — E118 Type 2 diabetes mellitus with unspecified complications: Secondary | ICD-10-CM

## 2020-10-30 DIAGNOSIS — E785 Hyperlipidemia, unspecified: Secondary | ICD-10-CM

## 2020-10-30 DIAGNOSIS — R002 Palpitations: Secondary | ICD-10-CM

## 2020-10-30 DIAGNOSIS — R1084 Generalized abdominal pain: Secondary | ICD-10-CM

## 2020-10-30 DIAGNOSIS — R935 Abnormal findings on diagnostic imaging of other abdominal regions, including retroperitoneum: Secondary | ICD-10-CM

## 2020-10-30 MED ORDER — METOPROLOL TARTRATE 50 MG PO TABS: 50.0000 mg | ORAL_TABLET | Freq: Two times a day (BID) | ORAL | 0 refills | Status: DC

## 2020-10-30 MED ORDER — TRIAMCINOLONE ACETONIDE 0.1 % EX CREA: 1.0000 "application " | TOPICAL_CREAM | Freq: Two times a day (BID) | CUTANEOUS | 0 refills | Status: DC | PRN

## 2020-10-30 NOTE — Progress Notes (Signed)
BP 131/73   Pulse 61   Temp 98.2 F (36.8 C)   SpO2 97%    Subjective:    Patient ID: Shari Collins, female    DOB: Nov 27, 1975, 45 y.o.   MRN: [id number]  HPI: Shari Collins is a 45 y.o. female presenting on 10/30/2020 for No chief complaint on file.   HPI   Pt had a negative covid 19 screening questionnaire.    Pt is 26yoF with DM, dyslipidemia, copd, abd pain and palpitations who presents for routine follow up.  Pt is not vaccinated for covid.  She had covid infection in January.    She was seen by cardiology.  Metoprolol prescribed by cardiology for palpitations.  Pt increased it to a whole pill, 25mg  bid .  Pt says she has not had any improvements. She is still drinking sodas.  She doesn't drink coffee.   She says the bentyl has helped her abd pain some but she is still having it.  Her CT that was ordered in January was rescheduled due to she had covid.  The CT will also be able to re-evaluate adrenal mass seen on CT done in 2018.        Relevant past medical, surgical, family and social history reviewed and updated as indicated. Interim medical history since our last visit reviewed. Allergies and medications reviewed and updated.   Current Outpatient Medications:  .  albuterol (VENTOLIN HFA) 108 (90 Base) MCG/ACT inhaler, Inhale 2 puffs into the lungs every 6 (six) hours as needed for wheezing or shortness of breath., Disp: 3 each, Rfl: 1 .  atorvastatin (LIPITOR) 20 MG tablet, Take 1 tablet (20 mg total) by mouth daily., Disp: 90 tablet, Rfl: 1 .  dicyclomine (BENTYL) 10 MG capsule, Take 1 capsule (10 mg total) by mouth 4 (four) times daily -  before meals and at bedtime., Disp: 30 capsule, Rfl: 2 .  fenofibrate (TRICOR) 145 MG tablet, Take 1 tablet (145 mg total) by mouth daily., Disp: 30 tablet, Rfl: 4 .  Fluticasone-Salmeterol (ADVAIR) 100-50 MCG/DOSE AEPB, INHALE 1 PUFF BY MOUTH EVERY MORNING AND EVERY NIGHT AT BEDTIME RINSE MOUTH AFTER USE, Disp: 120 each,  Rfl: 1 .  Insulin Glargine (LANTUS SOLOSTAR) 100 UNIT/ML Solostar Pen, Inject 32 Units into the skin at bedtime., Disp: , Rfl:  .  lisinopril (ZESTRIL) 10 MG tablet, Take 1 tablet (10 mg total) by mouth daily., Disp: 90 tablet, Rfl: 1 .  metFORMIN (GLUCOPHAGE) 1000 MG tablet, Take 1 tablet (1,000 mg total) by mouth 2 (two) times daily with a meal., Disp: 90 tablet, Rfl: 1 .  metoprolol tartrate (LOPRESSOR) 25 MG tablet, Take 0.5 tablets (12.5 mg total) by mouth 2 (two) times daily., Disp: 90 tablet, Rfl: 1 .  omeprazole (PRILOSEC) 40 MG capsule, Take 1 capsule (40 mg total) by mouth daily., Disp: 90 capsule, Rfl: 1 .  triamcinolone (KENALOG) 0.1 %, Apply 1 application topically 2 (two) times daily., Disp: 30 g, Rfl: 0    Review of Systems  Per HPI unless specifically indicated above     Objective:    BP 131/73   Pulse 61   Temp 98.2 F (36.8 C)   SpO2 97%   Wt Readings from Last 3 Encounters:  09/18/20 192 lb 9.6 oz (87.4 kg)  08/09/20 201 lb 6.4 oz (91.4 kg)  05/22/20 180 lb (81.6 kg)    Physical Exam Vitals reviewed.  Constitutional:      General: She is not in  acute distress.    Appearance: She is well-developed and well-nourished. She is not ill-appearing.  HENT:     Head: Normocephalic and atraumatic.  Cardiovascular:     Rate and Rhythm: Normal rate and regular rhythm.  Pulmonary:     Effort: Pulmonary effort is normal.     Breath sounds: Normal breath sounds.  Abdominal:     General: Bowel sounds are normal.     Palpations: Abdomen is soft. There is no hepatosplenomegaly or mass.     Tenderness: There is no abdominal tenderness. There is no guarding or rebound.  Musculoskeletal:        General: No edema.     Cervical back: Neck supple.     Right lower leg: No edema.     Left lower leg: No edema.  Lymphadenopathy:     Cervical: No cervical adenopathy.  Skin:    General: Skin is warm and dry.  Neurological:     Mental Status: She is alert and oriented to  person, place, and time.  Psychiatric:        Attention and Perception: Attention normal.        Mood and Affect: Mood and affect normal.        Speech: Speech normal.        Behavior: Behavior normal. Behavior is cooperative.         Results for orders placed or performed during the hospital encounter of 10/29/20  Hemoglobin A1c  Result Value Ref Range   Hgb A1c MFr Bld 7.2 (H) 4.8 - 5.6 %   Mean Plasma Glucose 159.94 mg/dL  Comprehensive metabolic panel  Result Value Ref Range   Sodium 133 (L) 135 - 145 mmol/L   Potassium 4.1 3.5 - 5.1 mmol/L   Chloride 104 98 - 111 mmol/L   CO2 23 22 - 32 mmol/L   Glucose, Bld 99 70 - 99 mg/dL   BUN 17 6 - 20 mg/dL   Creatinine, Ser 0.80 0.44 - 1.00 mg/dL   Calcium 8.6 (L) 8.9 - 10.3 mg/dL   Total Protein 6.5 6.5 - 8.1 g/dL   Albumin 3.8 3.5 - 5.0 g/dL   AST 16 15 - 41 U/L   ALT 17 0 - 44 U/L   Alkaline Phosphatase 35 (L) 38 - 126 U/L   Total Bilirubin 0.3 0.3 - 1.2 mg/dL   GFR, Estimated >60 >60 mL/min   Anion gap 6 5 - 15  Lipid panel  Result Value Ref Range   Cholesterol 137 0 - 200 mg/dL   Triglycerides 133 <150 mg/dL   HDL 42 >40 mg/dL   Total CHOL/HDL Ratio 3.3 RATIO   VLDL 27 0 - 40 mg/dL   LDL Cholesterol 68 0 - 99 mg/dL      Assessment & Plan:    Encounter Diagnoses  Name Primary?  . Controlled diabetes mellitus type 2 with complications, unspecified whether long term insulin use ([place]) Yes  . Generalized abdominal pain   . Abnormal CT of the abdomen   . Hyperlipidemia, unspecified hyperlipidemia type   . Essential hypertension   . Tobacco use disorder   . Chronic obstructive pulmonary disease, unspecified COPD type ([place])   . Palpitations   . Encounter for screening for malignant neoplasm of breast, unspecified screening modality      -reviewed labs with pt  -Encouraged covid vaccination  -Pt to continue current medications  -will Increase metoprolol for palpitations.  Discussed pulse and pt was showed  how to  check it.   -DM Foot exam updated done  -Pt on list dm eye exam tentatively scheduled for June  -CT next week as scheduled.  She will be contacted with results  -Refer for annual screening Mammogram  -Pt to follow up 3 months.  She is to  Contact office sooner prn

## 2020-11-05 ENCOUNTER — Ambulatory Visit (HOSPITAL_COMMUNITY)
Admission: RE | Admit: 2020-11-05 | Discharge: 2020-11-05 | Disposition: A | Discharge status: Home / Self Care | Payer: Self-pay | Source: Ambulatory Visit | Attending: Physician Assistant | Admitting: Physician Assistant

## 2020-11-05 ENCOUNTER — Other Ambulatory Visit: Payer: Self-pay | Admitting: Physician Assistant

## 2020-11-05 ENCOUNTER — Other Ambulatory Visit: Payer: Self-pay

## 2020-11-05 DIAGNOSIS — R1084 Generalized abdominal pain: Secondary | ICD-10-CM

## 2020-11-05 DIAGNOSIS — R935 Abnormal findings on diagnostic imaging of other abdominal regions, including retroperitoneum: Secondary | ICD-10-CM | POA: Insufficient documentation

## 2020-11-05 MED ORDER — IOHEXOL 300 MG/ML  SOLN
100.0000 mL | Freq: Once | INTRAMUSCULAR | Status: AC | PRN
  Administered 2020-11-05: 100 mL via INTRAVENOUS

## 2020-11-07 ENCOUNTER — Other Ambulatory Visit: Payer: Self-pay | Admitting: Physician Assistant

## 2020-11-07 DIAGNOSIS — Z1239 Encounter for other screening for malignant neoplasm of breast: Secondary | ICD-10-CM

## 2020-11-14 ENCOUNTER — Other Ambulatory Visit: Payer: Self-pay | Admitting: Physician Assistant

## 2020-11-14 MED ORDER — DICYCLOMINE HCL 10 MG PO CAPS
10.0000 mg | ORAL_CAPSULE | Freq: Three times a day (TID) | ORAL | 2 refills | Status: DC
Start: 2020-11-14 — End: 2021-01-13

## 2020-11-25 ENCOUNTER — Other Ambulatory Visit: Payer: Self-pay

## 2020-11-25 ENCOUNTER — Ambulatory Visit (HOSPITAL_COMMUNITY)
Admission: RE | Admit: 2020-11-25 | Discharge: 2020-11-25 | Disposition: A | Discharge status: Home / Self Care | Payer: Self-pay | Source: Ambulatory Visit | Attending: Physician Assistant | Admitting: Physician Assistant

## 2020-11-25 DIAGNOSIS — Z1239 Encounter for other screening for malignant neoplasm of breast: Secondary | ICD-10-CM | POA: Insufficient documentation

## 2020-12-02 ENCOUNTER — Other Ambulatory Visit: Payer: Self-pay

## 2020-12-02 ENCOUNTER — Ambulatory Visit: Payer: Self-pay | Admitting: Physician Assistant

## 2020-12-02 ENCOUNTER — Encounter: Payer: Self-pay | Admitting: Physician Assistant

## 2020-12-02 VITALS — BP 128/72 | HR 65 | Temp 96.5°F | Wt 205.0 lb

## 2020-12-02 DIAGNOSIS — R1012 Left upper quadrant pain: Secondary | ICD-10-CM

## 2020-12-02 DIAGNOSIS — R197 Diarrhea, unspecified: Secondary | ICD-10-CM

## 2020-12-02 NOTE — Progress Notes (Signed)
BP 128/72   Pulse 65   Temp (!) 96.5 F (35.8 C)   Wt 205 lb (93 kg)   LMP 11/05/2020   SpO2 98%   BMI 33.09 kg/m    Subjective:    Patient ID: Shari Collins, female    DOB: May 12, 1976, 45 y.o.   MRN: [id number]  HPI: Shari Collins is a 45 y.o. female presenting on 12/02/2020 for Abdominal Pain and Diarrhea   HPI  Pt had a negative covid 19 screening questionnaire.   Pt is 37yoF who presents for follow up abdominal pain and diarrhea.  Pt says she still has abd pain.  She is still having diarrhea on some days and not others.   Ct was ordered in January but was rescheduled due to she got covid.    Pt CT r/s because she was sick.    She has been having the left sided abdominal pain since December.  She is currently using omeprazole.     Relevant past medical, surgical, family and social history reviewed and updated as indicated. Interim medical history since our last visit reviewed. Allergies and medications reviewed and updated.    Current Outpatient Medications:  .  albuterol (VENTOLIN HFA) 108 (90 Base) MCG/ACT inhaler, Inhale 2 puffs into the lungs every 6 (six) hours as needed for wheezing or shortness of breath., Disp: 3 each, Rfl: 1 .  atorvastatin (LIPITOR) 20 MG tablet, Take 1 tablet (20 mg total) by mouth daily., Disp: 90 tablet, Rfl: 1 .  dicyclomine (BENTYL) 10 MG capsule, Take 1 capsule (10 mg total) by mouth 4 (four) times daily -  before meals and at bedtime., Disp: 30 capsule, Rfl: 2 .  fenofibrate (TRICOR) 145 MG tablet, Take 1 tablet (145 mg total) by mouth daily., Disp: 30 tablet, Rfl: 4 .  Fluticasone-Salmeterol (ADVAIR) 100-50 MCG/DOSE AEPB, INHALE 1 PUFF BY MOUTH EVERY MORNING AND EVERY NIGHT AT BEDTIME RINSE MOUTH AFTER USE, Disp: 120 each, Rfl: 1 .  Insulin Glargine (LANTUS SOLOSTAR) 100 UNIT/ML Solostar Pen, Inject 32 Units into the skin at bedtime., Disp: , Rfl:  .  lisinopril (ZESTRIL) 10 MG tablet, Take 1 tablet (10 mg total) by mouth daily.,  Disp: 90 tablet, Rfl: 1 .  metFORMIN (GLUCOPHAGE) 1000 MG tablet, Take 1 tablet (1,000 mg total) by mouth 2 (two) times daily with a meal., Disp: 90 tablet, Rfl: 1 .  metoprolol tartrate (LOPRESSOR) 50 MG tablet, Take 1 tablet (50 mg total) by mouth 2 (two) times daily., Disp: 180 tablet, Rfl: 0 .  omeprazole (PRILOSEC) 40 MG capsule, Take 1 capsule (40 mg total) by mouth daily., Disp: 90 capsule, Rfl: 1 .  triamcinolone (KENALOG) 0.1 %, Apply 1 application topically 2 (two) times daily as needed., Disp: 60 g, Rfl: 0    Review of Systems  Per HPI unless specifically indicated above     Objective:    BP 128/72   Pulse 65   Temp (!) 96.5 F (35.8 C)   Wt 205 lb (93 kg)   LMP 11/05/2020   SpO2 98%   BMI 33.09 kg/m   Wt Readings from Last 3 Encounters:  12/02/20 205 lb (93 kg)  09/18/20 192 lb 9.6 oz (87.4 kg)  08/09/20 201 lb 6.4 oz (91.4 kg)    Physical Exam Vitals reviewed.  Constitutional:      General: She is not in acute distress.    Appearance: She is well-developed. She is not toxic-appearing.  HENT:  Head: Normocephalic and atraumatic.  Cardiovascular:     Rate and Rhythm: Normal rate and regular rhythm.  Pulmonary:     Effort: Pulmonary effort is normal.     Breath sounds: Normal breath sounds.  Abdominal:     General: Bowel sounds are normal.     Palpations: Abdomen is soft. There is no mass.     Tenderness: There is no abdominal tenderness. There is no guarding or rebound.  Musculoskeletal:     Cervical back: Neck supple.     Right lower leg: No edema.     Left lower leg: No edema.  Lymphadenopathy:     Cervical: No cervical adenopathy.  Skin:    General: Skin is warm and dry.  Neurological:     Mental Status: She is alert and oriented to person, place, and time.  Psychiatric:        Behavior: Behavior normal.           Assessment & Plan:    Encounter Diagnoses  Name Primary?  . Left upper quadrant abdominal pain Yes  . Diarrhea,  unspecified type       -recomended covid vacciantion -Reviewed CT results with pt -pt given reading information on atherosclerosis and encouraged to stop smoking -Refer GI for persistent abd pain.  She is to continue the omeprazole. -pt says she has [employer] financial assistance -F/u may as scheduled for DM, cholesterol.  She is to contact office sooner prn

## 2020-12-02 NOTE — Patient Instructions (Signed)
Atherosclerosis  Atherosclerosis is narrowing and hardening of the arteries. Arteries are blood vessels that carry blood from the heart to all parts of the body. This blood contains oxygen. Arteries can become narrow or blocked from inflammation or from a buildup of fat, cholesterol, calcium, and other substances (plaque). Plaque decreases the amount of blood that can flow through the artery. Atherosclerosis can affect any artery in your body, including:  Heart arteries. Damage to these arteries may lead to coronary artery disease, which can cause a heart attack.  Brain arteries. Damage to these arteries may cause a stroke.  Leg, arm, and pelvis arteries. Peripheral artery disease (PAD) may result from damage to these arteries.  Kidney arteries. Kidney (renal) failure may result from damage to kidney arteries. Treatment may slow the disease and prevent further damage to your heart, brain, peripheral arteries, and kidneys. What are the causes? This condition develops slowly over many years. The inner layers of your arteries become damaged and allow the gradual buildup of plaque. The exact cause of atherosclerosis is not fully understood. Symptoms of atherosclerosis do not occur until an artery becomes narrow or blocked. What increases the risk? The following factors may make you more likely to develop this condition:  Being middle-aged or older.  Certain medical conditions, including: ? High blood pressure. ? High cholesterol. ? High blood fats (triglycerides). ? Diabetes. ? Sleep apnea.  A family history of atherosclerosis.  Being overweight.  Using products that contain tobacco or nicotine.  Not exercising enough (sedentary lifestyle).  Having a substance in your blood called C-reactive protein (CRP). This is a sign of increased levels of inflammation in your body.  Being stressed.  Drinking too much alcohol or using drugs, such as cocaine or methamphetamine. What are the  signs or symptoms? Symptoms of atherosclerosis may not be obvious until there is damage to an area of your body that is not getting enough blood. Sometimes, atherosclerosis does not cause symptoms. Symptoms of this condition include:  Coronary artery disease. This may cause chest pain and shortness of breath.  Decreased blood supply to your brain, which may cause a stroke. Signs of a stroke may include sudden: ? Weakness or numbness in your face, arm, or leg, especially on one side of your body. ? Trouble walking or difficulty moving your arms or legs. ? Loss of balance or coordination. ? Confusion. ? Slurred speech (dysarthria). ? Trouble speaking, or trouble understanding speech, or both (aphasia). ? Vision changes in one or both eyes. This may be double vision, blurred vision, or loss of vision. ? Severe headache with no known cause. The headache is often described as the worst headache ever experienced.  PAD, which may cause pain and numbness, often in your legs and hips.  Renal failure. This may cause tiredness, problems with urination, swelling, and itchy skin. How is this diagnosed? This condition is diagnosed based on your medical history and a physical exam. During the exam, your health care provider will:  Check your pulse in different places.  Listen for a "whooshing" sound over your arteries (bruit). You may also have tests, such as:  Blood tests to check your levels of cholesterol, triglycerides, and CRP.  Electrocardiogram (ECG) to check for heart damage.  Chest X-ray to see if you have an enlarged heart, which is a sign of heart failure.  Stress test to see how your heart reacts to exercise.  Echocardiogram to get images of the inside of your heart.  Ankle-brachial index  to compare blood pressure in your arms to blood pressure in your ankles.  Ultrasound of your peripheral arteries to check blood flow.  CT scan to check for damage to your heart or  brain.  X-rays of blood vessels after dye has been injected (angiogram) to check blood flow. How is this treated? This condition is treated with lifestyle changes as the first step. These may include:  Changing your diet.  Losing weight.  Reducing stress.  Exercising and being physically active more regularly.  Quitting smoking. You may also need medicine to:  Lower triglycerides and cholesterol.  Control blood pressure.  Prevent blood clots.  Lower inflammation in your body.  Control your blood sugar. Sometimes, surgery is needed to:  Remove plaque from an artery (endarterectomy).  Open or widen a narrowed heart artery (angioplasty).  Create a new path for your blood with one of these procedures: ? Heart (coronary) artery bypass graft surgery. ? Peripheral artery bypass graft surgery. Follow these instructions at home: Eating and drinking  Eat a heart-healthy diet. Talk with your health care provider or a diet and nutrition specialist (dietitian) if you need help. A heart-healthy diet involves: ? Limiting unhealthy fats and increasing healthy fats. Some examples of healthy fats are olive oil and canola oil. ? Eating plant-based foods, such as fruits, vegetables, nuts, whole grains, and legumes (such as peas and lentils).  If you drink alcohol: ? Limit how much you use to:  0-1 drink a day for nonpregnant women.  0-2 drinks a day for men. ? Be aware of how much alcohol is in your drink. In the [place] one drink equals one 12 oz bottle of beer (355 mL), one 5 oz glass of wine (148 mL), or one 1 oz glass of hard liquor (44 mL).   Lifestyle  Maintain a healthy weight. Lose weight if your health care provider says that you need to do that.  Follow an exercise program as told by your health care provider.  Do not use any products that contain nicotine or tobacco, such as cigarettes, e-cigarettes, and chewing tobacco. If you need help quitting, ask your health care  provider.  Do not use drugs.   General instructions  Take over-the-counter and prescription medicines only as told by your health care provider.  Manage other health conditions as told.  Keep all follow-up visits as told by your health care provider. This is important. Contact a health care provider if you have:  An irregular heartbeat.  Unexplained fatigue.  Trouble urinating, or you are producing less urine or foamy urine.  Swelling of your hands or feet, or itchy skin.  Unexplained pain or numbness in your legs or hips. Get help right away if:  You have any symptoms of a heart attack. These may be: ? Chest pain. This includes squeezing chest pain that may feel like indigestion (angina). ? Shortness of breath. ? Pain in your neck, jaw, arms, back, or stomach. ? Cold sweat. ? Nausea. ? Light-headedness.  You have any symptoms of a stroke. "BE FAST" is an easy way to remember the main warning signs of a stroke: ? B - Balance. Signs are dizziness, sudden trouble walking, or loss of balance. ? E - Eyes. Signs are trouble seeing or a sudden change in vision. ? F - Face. Signs are sudden weakness or numbness of the face, or the face or eyelid drooping on one side. ? A - Arms. Signs are weakness or numbness in an arm.  This happens suddenly and usually on one side of the body. ? S - Speech. Signs are sudden trouble speaking, slurred speech, or trouble understanding what people say. ? T - Time. Time to call emergency services. Write down what time symptoms started.  You have other signs of a stroke, such as: ? A sudden, severe headache with no known cause. ? Nausea or vomiting. ? Seizure. These symptoms may represent a serious problem that is an emergency. Do not wait to see if the symptoms will go away. Get medical help right away. Call your local emergency services (911 in the [place] Do not drive yourself to the hospital. Summary  Atherosclerosis is narrowing and hardening of  the arteries.  Arteries can become narrow from inflammation or from a buildup of fat, cholesterol, calcium, and other substances (plaque).  This condition may not cause any symptoms. If you do have symptoms, they are caused by damage to an area of your body that is not getting enough blood.  Treatment starts with lifestyle changes and may include medicines. In some cases, surgery is needed.  Get help right away if you have any symptoms of a heart attack or stroke. This information is not intended to replace advice given to you by your health care provider. Make sure you discuss any questions you have with your health care provider. Document Revised: 07/03/2019 Document Reviewed: 07/03/2019 Elsevier Patient Education  [place].

## 2020-12-23 ENCOUNTER — Other Ambulatory Visit: Payer: Self-pay | Admitting: Physician Assistant

## 2020-12-27 ENCOUNTER — Encounter (HOSPITAL_COMMUNITY): Payer: Self-pay

## 2020-12-27 ENCOUNTER — Other Ambulatory Visit: Payer: Self-pay

## 2020-12-27 ENCOUNTER — Emergency Department (HOSPITAL_COMMUNITY): Payer: No Typology Code available for payment source

## 2020-12-27 ENCOUNTER — Emergency Department (HOSPITAL_COMMUNITY)
Admission: EM | Admit: 2020-12-27 | Discharge: 2020-12-27 | Disposition: A | Discharge status: Home / Self Care | Payer: No Typology Code available for payment source | Attending: Emergency Medicine | Admitting: Emergency Medicine

## 2020-12-27 DIAGNOSIS — M5412 Radiculopathy, cervical region: Secondary | ICD-10-CM

## 2020-12-27 DIAGNOSIS — Z794 Long term (current) use of insulin: Secondary | ICD-10-CM | POA: Insufficient documentation

## 2020-12-27 DIAGNOSIS — Z91040 Latex allergy status: Secondary | ICD-10-CM | POA: Insufficient documentation

## 2020-12-27 DIAGNOSIS — I10 Essential (primary) hypertension: Secondary | ICD-10-CM | POA: Diagnosis not present

## 2020-12-27 DIAGNOSIS — R202 Paresthesia of skin: Secondary | ICD-10-CM | POA: Insufficient documentation

## 2020-12-27 DIAGNOSIS — F17210 Nicotine dependence, cigarettes, uncomplicated: Secondary | ICD-10-CM | POA: Diagnosis not present

## 2020-12-27 DIAGNOSIS — Z79899 Other long term (current) drug therapy: Secondary | ICD-10-CM | POA: Insufficient documentation

## 2020-12-27 DIAGNOSIS — Z7984 Long term (current) use of oral hypoglycemic drugs: Secondary | ICD-10-CM | POA: Insufficient documentation

## 2020-12-27 DIAGNOSIS — J45909 Unspecified asthma, uncomplicated: Secondary | ICD-10-CM | POA: Diagnosis not present

## 2020-12-27 DIAGNOSIS — M79601 Pain in right arm: Secondary | ICD-10-CM | POA: Diagnosis not present

## 2020-12-27 DIAGNOSIS — E119 Type 2 diabetes mellitus without complications: Secondary | ICD-10-CM | POA: Insufficient documentation

## 2020-12-27 DIAGNOSIS — M542 Cervicalgia: Secondary | ICD-10-CM | POA: Diagnosis present

## 2020-12-27 HISTORY — DX: Unspecified atrial fibrillation: I48.91

## 2020-12-27 HISTORY — DX: Pure hypercholesterolemia, unspecified: E78.00

## 2020-12-27 LAB — CBC WITH DIFFERENTIAL/PLATELET
Abs Immature Granulocytes: 0.04 10*3/uL (ref 0.00–0.07)
Basophils Absolute: 0.1 10*3/uL (ref 0.0–0.1)
Basophils Relative: 1 %
Eosinophils Absolute: 1.4 10*3/uL — ABNORMAL HIGH (ref 0.0–0.5)
Eosinophils Relative: 13 %
HCT: 40.5 % (ref 36.0–46.0)
Hemoglobin: 13.5 g/dL (ref 12.0–15.0)
Immature Granulocytes: 0 %
Lymphocytes Relative: 25 %
Lymphs Abs: 2.7 10*3/uL (ref 0.7–4.0)
MCH: 29.8 pg (ref 26.0–34.0)
MCHC: 33.3 g/dL (ref 30.0–36.0)
MCV: 89.4 fL (ref 80.0–100.0)
Monocytes Absolute: 0.6 10*3/uL (ref 0.1–1.0)
Monocytes Relative: 5 %
Neutro Abs: 5.9 10*3/uL (ref 1.7–7.7)
Neutrophils Relative %: 56 %
Platelets: 365 10*3/uL (ref 150–400)
RBC: 4.53 MIL/uL (ref 3.87–5.11)
RDW: 15.4 % (ref 11.5–15.5)
WBC: 10.8 10*3/uL — ABNORMAL HIGH (ref 4.0–10.5)
nRBC: 0 % (ref 0.0–0.2)

## 2020-12-27 LAB — BASIC METABOLIC PANEL
Anion gap: 8 (ref 5–15)
BUN: 15 mg/dL (ref 6–20)
CO2: 22 mmol/L (ref 22–32)
Calcium: 8.9 mg/dL (ref 8.9–10.3)
Chloride: 107 mmol/L (ref 98–111)
Creatinine, Ser: 0.61 mg/dL (ref 0.44–1.00)
GFR, Estimated: >60 mL/min (ref >60)
Glucose, Bld: 136 mg/dL — ABNORMAL HIGH (ref 70–99)
Potassium: 3.9 mmol/L (ref 3.5–5.1)
Sodium: 137 mmol/L (ref 135–145)

## 2020-12-27 MED ORDER — OXYCODONE-ACETAMINOPHEN 5-325 MG PO TABS: 1.0000 | ORAL_TABLET | Freq: Four times a day (QID) | ORAL | 0 refills | Status: DC | PRN

## 2020-12-27 MED ORDER — IBUPROFEN 800 MG PO TABS: 800.0000 mg | ORAL_TABLET | Freq: Three times a day (TID) | ORAL | 0 refills | Status: DC

## 2020-12-27 MED ORDER — DIAZEPAM 5 MG PO TABS
5.0000 mg | ORAL_TABLET | Freq: Once | ORAL | Status: AC
  Administered 2020-12-27: 5 mg via ORAL
  Filled 2020-12-27: qty 1

## 2020-12-27 MED ORDER — METHOCARBAMOL 500 MG PO TABS: 500.0000 mg | ORAL_TABLET | Freq: Three times a day (TID) | ORAL | 0 refills | Status: DC

## 2020-12-27 MED ORDER — KETOROLAC TROMETHAMINE 30 MG/ML IJ SOLN
30.0000 mg | Freq: Once | INTRAMUSCULAR | Status: AC
  Administered 2020-12-27: 30 mg via INTRAVENOUS
  Filled 2020-12-27: qty 1

## 2020-12-27 NOTE — ED Notes (Signed)
Icy hot, ice packs , heat packs pt has tried to relieve pain without help. DR gave her a muscle relaxer with no help. Pt takes lots of Ibuprofen with no help.

## 2020-12-27 NOTE — ED Triage Notes (Signed)
Pt presents to ED with complaints of chronic neck and right shoulder pain from injury in Sept at work. Pt states pain is getting worse and has trouble gripping items and holding items in right hand.

## 2020-12-27 NOTE — ED Provider Notes (Signed)
[facility] EMERGENCY DEPARTMENT Provider Note   CSN: [id number] Arrival date & time: 12/27/20  1638     History Chief Complaint  Patient presents with  . Neck Pain    Shari Collins is a 45 y.o. female.  HPI      Shari Collins is a 45 y.o. female who presents to the Emergency Department complaining of persistent right neck and arm pain with pins-and-needles sensation to her arm and hand.  Symptoms have been present since a work-related injury that occurred in September 2021.  She states that she was reaching for a heavy box on a top shelf when she felt a sharp pain to her neck and arm.  Since that time, she describes having difficulty gripping items with her right hand and a constant, stinging, pins and needle sensation radiating through her arm to the level of her fingers.  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  She denies fever, chills, swelling or numbness of her arm.  No chest pain or shortness of breath.   Past Medical History:  Diagnosis Date  . Asthma   . Atrial fibrillation ([place])   . Depression   . Diabetes mellitus   . Hypercholesteremia   . Hypertension   . Noncompliance     Patient Active Problem List   Diagnosis Date Noted  . Diabetic retinopathy ([place]) 09/14/2018  . Hyperlipidemia 10/07/2015  . Breast abscess 10/02/2015  . Schizophrenia ([place]) 10/02/2015  . Bipolar disorder, unspecified ([place]) 10/02/2015  . Uncontrolled type 2 diabetes mellitus with complication ([place]) 81/09/7508  . Personal history of noncompliance with medical treatment, presenting hazards to health 09/24/2015    Past Surgical History:  Procedure Laterality Date  . CESAREAN SECTION    . TONSILLECTOMY       OB History    Gravida  4   Para  1   Term  1   Preterm      AB  3   Living  1     SAB  3   IAB      Ectopic      Multiple      Live Births  1           Family History  Problem Relation Age  of Onset  . Hypertension Paternal Grandmother   . Hypertension Maternal Grandmother   . Diabetes Father   . Hypertension Father   . Cancer Mother        liver  . Heart attack Paternal Grandfather   . Diabetes Brother     Social History   Tobacco Use  . Smoking status: Current Every Day Smoker    Packs/day: 0.50    Years: 20.00    Pack years: 10.00    Types: Cigarettes  . Smokeless tobacco: Never Used  Vaping Use  . Vaping Use: Never used  Substance Use Topics  . Alcohol use: Not Currently    Comment: Weekend -- almost a 5th  . Drug use: No    Home Medications Prior to Admission medications   Medication Sig Start Date End Date Taking? Authorizing Provider  albuterol (VENTOLIN HFA) 108 (90 Base) MCG/ACT inhaler Inhale 2 puffs into the lungs every 6 (six) hours as needed for wheezing or shortness of breath. 07/23/20   Soyla Dryer, PA-C  atorvastatin (LIPITOR) 20 MG tablet Take 1 tablet (20 mg total) by mouth daily. 07/23/20   Soyla Dryer, PA-C  dicyclomine (BENTYL) 10 MG capsule Take 1 capsule (10 mg total) by mouth 4 (four) times daily -  before meals and at bedtime. 11/14/20   Soyla Dryer, PA-C  fenofibrate (TRICOR) 145 MG tablet Take 1 tablet (145 mg total) by mouth daily. 07/23/20   Soyla Dryer, PA-C  Fluticasone-Salmeterol (ADVAIR) 100-50 MCG/DOSE AEPB INHALE 1 PUFF BY MOUTH EVERY MORNING AND EVERY NIGHT AT BEDTIME RINSE MOUTH AFTER USE 09/26/20   Soyla Dryer, PA-C  Insulin Glargine (LANTUS SOLOSTAR) 100 UNIT/ML Solostar Pen Inject 32 Units into the skin at bedtime. 10/03/19   Soyla Dryer, PA-C  lisinopril (ZESTRIL) 10 MG tablet Take 1 tablet (10 mg total) by mouth daily. 07/23/20   Soyla Dryer, PA-C  metFORMIN (GLUCOPHAGE) 1000 MG tablet TAKE 1 Tablet  BY MOUTH TWICE DAILY WITH A MEAL 12/23/20   Soyla Dryer, PA-C  metoprolol tartrate (LOPRESSOR) 50 MG tablet Take 1 tablet (50 mg total) by mouth 2 (two) times daily. 10/30/20   Soyla Dryer, PA-C  omeprazole (PRILOSEC) 40 MG capsule Take 1 capsule (40 mg total) by mouth daily. 07/23/20   Soyla Dryer, PA-C  triamcinolone cream (KENALOG) 0.1 % APPLY TO THE AFFECTED AREA TWICE DAILY AS NEEDED 12/23/20   Soyla Dryer, PA-C    Allergies    Bee venom, Buspar [buspirone], Latex, Reglan [metoclopramide], and Sulfa antibiotics  Review of Systems   Review of Systems  Constitutional: Negative for chills and fever.  Eyes: Negative for visual disturbance.  Respiratory: Negative for cough and shortness of breath.   Cardiovascular: Negative for chest pain.  Genitourinary: Negative for difficulty urinating and dysuria.  Musculoskeletal: Positive for neck pain. Negative for arthralgias and joint swelling.  Skin: Negative for color change, rash and wound.  Neurological: Positive for numbness (Pins-and-needles sensation of the right arm and hand). Negative for dizziness, weakness and headaches.  All other systems reviewed and are negative.   Physical Exam Updated Vital Signs BP (!) 150/74   Pulse (!) 57   Temp 98.4 F (36.9 C) (Oral)   Resp 17   Ht 5\' 6"  (1.676 m)   Wt 90.7 kg   LMP 12/07/2020   SpO2 100%   BMI 32.28 kg/m   Physical Exam Vitals and nursing note reviewed.  Constitutional:      General: She is not in acute distress.    Appearance: Normal appearance. She is not toxic-appearing.  HENT:     Head: Atraumatic.     Mouth/Throat:     Mouth: Mucous membranes are moist.  Neck:     Trachea: Phonation normal.     Comments: Tender to palpation of the mid to lower cervical spine.  No bony step-offs.  Mild tenderness of the right trapezius muscle as well. Cardiovascular:     Rate and Rhythm: Normal rate and regular rhythm.     Pulses: Normal pulses.  Pulmonary:     Effort: Pulmonary effort is normal.     Breath sounds: Normal breath sounds.  Musculoskeletal:        General: Normal range of motion.     Cervical back: Tenderness present. Pain with  movement, spinous process tenderness and muscular tenderness present.     Comments: Gross sensation intact to the right hand.  Grip strengths are symmetrical. Able to perform ROM of the right arm  Skin:    General: Skin is warm.     Capillary Refill: Capillary refill takes less than 2 seconds.     Findings: No erythema or rash.  Neurological:     General: No focal deficit present.     Mental Status: She is alert.     GCS: GCS eye subscore is 4. GCS verbal subscore is 5. GCS motor subscore is 6.     Sensory: Sensation is intact.     Motor: Motor function is intact.     Comments: CN III-XII grossly intact.  Speech clear     ED Results / Procedures / Treatments   Labs (all labs ordered are listed, but only abnormal results are displayed) Labs Reviewed  BASIC METABOLIC PANEL - Abnormal; Notable for the following components:      Result Value   Glucose, Bld 136 (*)    All other components within normal limits  CBC WITH DIFFERENTIAL/PLATELET - Abnormal; Notable for the following components:   WBC 10.8 (*)    Eosinophils Absolute 1.4 (*)    All other components within normal limits    EKG None  Radiology CT Cervical Spine Wo Contrast  Result Date: 12/27/2020 CLINICAL DATA:  Cervical spine injury September 2021, right cervical radiculopathy. Chronic neck and right shoulder pain. Progressive pain. EXAM: CT CERVICAL SPINE WITHOUT CONTRAST TECHNIQUE: Multidetector CT imaging of the cervical spine was performed without intravenous contrast. Multiplanar CT image reconstructions were also generated. COMPARISON:  Cervical radiograph 05/22/2020 FINDINGS: Alignment: Straightening of normal lordosis, similar to prior exam. No listhesis or subluxation. Skull base and vertebrae: No acute or healed fracture. C2 and skull base are intact. No primary bone lesion or focal pathologic process. Soft tissues and spinal canal: No prevertebral fluid or swelling. No visible canal hematoma. Disc levels: Disc  space narrowing with anterior and posterior endplate spurring at [id number], C5-C6, and to a lesser extent C6-C7. Posterior disc osteophyte complex causes mild neural foraminal stenosis at C4-C5 on the right. No high-grade canal stenosis. Upper chest: No acute or unexpected findings. Other: None. IMPRESSION: 1. 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 Posterior disc osteophyte complex causes mild right neural foraminal stenosis at C4-C5. No high-grade canal stenosis. 2. Straightening of normal lordosis may be due to positioning or muscle spasm, and is similar to prior cervical radiographs. Electronically Signed   By: Keith Rake M.D.   On: 12/27/2020 18:31    Procedures Procedures   Medications Ordered in ED Medications  ketorolac (TORADOL) 30 MG/ML injection 30 mg (30 mg Intravenous Given 12/27/20 1846)  diazepam (VALIUM) tablet 5 mg (5 mg Oral Given 12/27/20 1846)    ED Course  I have reviewed the triage vital signs and the nursing notes.  Pertinent labs & imaging results that were available during my care of the patient were reviewed by me and considered in my medical decision making (see chart for details).    MDM Rules/Calculators/A&P                          Patient here with chronic paresthesias of the right arm and hand secondary to a work-related injury that occurred last September.  She presented to the emergency department and had C-spine films without evidence of acute bony injury.  No follow-up since the initial injury.  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  CT scan today shows foraminal stenosis at C4-5 and straightening of the normal lordosis which is likely secondary to muscle spasm.  On exam, she has equal grip strength, no pronator drift and gross sensation is intact.  Recommended steroids but patient  declined stating that steroids elevates her blood sugar.  Will provide short course of anti-inflammatory and pain medication.  No  findings to suggest acute process, I have recommended that she follow-up with neurosurgery.  The patient appears reasonably screened and/or stabilized for discharge and I doubt any other medical condition or other [facility] requiring further screening, evaluation, or treatment in the ED at this time prior to discharge.   Final Clinical Impression(s) / ED Diagnoses Final diagnoses:  Cervical radiculopathy    Rx / DC Orders ED Discharge Orders    None       [place], [place] 12/28/20 [id number]    Luna Fuse, MD 12/29/20 1513

## 2020-12-27 NOTE — Discharge Instructions (Signed)
Continue to apply ice packs on and off to your neck.  Take the medication as directed.  Call the neurosurgeon at the number listed to arrange a follow-up appointment.  I have also given you referral information for a local primary care provider for you to establish primary care.

## 2020-12-30 MED FILL — Oxycodone w/ Acetaminophen Tab 5-325 MG: ORAL | Qty: 6 | Status: AC

## 2021-01-02 ENCOUNTER — Telehealth: Payer: Self-pay | Admitting: Physician Assistant

## 2021-01-02 NOTE — Telephone Encounter (Signed)
Patient called in yesterday about back pain that she went to the ER about. Patient was told she needed to see a Neurosurgeon and wanted Larene Beach to refer to a neurosurgeon in [facility] network but patient was told that their is no neurosurgeon in the [facility] that takes charity care. Patient then stated it was a workers compensation case and her lawyer was working on getting her seen. After talking with Larene Beach patient was advised to talk with lawyer about getting her workers compensation to pay for her visit. Patient was advised to continue to taking the prescriptions she was given in ER and she will access her situation at her next visit in May.

## 2021-01-06 ENCOUNTER — Other Ambulatory Visit: Payer: Self-pay | Admitting: Physician Assistant

## 2021-01-06 MED ORDER — METHOCARBAMOL 500 MG PO TABS: 500.0000 mg | ORAL_TABLET | Freq: Three times a day (TID) | ORAL | 0 refills | Status: DC

## 2021-01-11 NOTE — H&P (View-Only) (Signed)
Referring Provider: Soyla Dryer, PA-C Primary Care Physician:  Soyla Dryer, PA-C Primary Gastroenterologist:  Dr. Gala Romney  Chief Complaint  Patient presents with  . Diarrhea  . Abdominal Pain    LUQ pain, all the time  . Nausea    No vomiting    HPI:   Shari Collins is a 45 y.o. female presenting today at the request of Soyla Dryer, PA-C for LUQ abdominal pain and diarrhea.  CT abdomen with and without contrast and pelvis with contrast 11/05/2020 for left-sided abdominal pain and diarrhea with cholelithiasis without cholecystitis, no biliary abnormalities, pancreas and spleen appeared normal, stomach, small bowel, colon normal, bilateral adrenal adenomas without substantial change.  Plan to decrease metformin to 500 mg twice daily and increase Lantus to 35 units daily.  She had follow-up with Soyla Dryer, PA-C on 3/28.  She continued with abdominal pain.  Diarrhea some days.  She was referred to GI for further evaluation.  Today: Epigastric/LUQ abdominal pain x1 year. Burning. Feels like a rubber band popping. Constant.  Can be worsened by eating.  No alleviating factors.  PCP started her on dicyclomine which is not helping.  Also reports pain can be affected by movement at times.  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  History of GERD on Prilosec 40 mg daily.  Breakthrough symptoms a couple times a week.  Limits fried/fatty foods.  Some spicy foods.  Soda daily.  Admits to solid food dysphagia.  Takes ibuprofen 800 mg 1-2 times a day for neck pain.  Also with daily nausea, rare vomiting.  Nausea is associated with constipation.  States she can go 2 to 3 weeks without a bowel movement.  When she finally starts to move her bowels, she has diarrhea.  With diarrhea, she will have 3-4 bowel movements per day.  No BRBPR or melena.  Left-sided pain will worsen with constipation as well.  Constipation is chronic since she was a child.  She does not take  anything for this.  Denies fever, chills, cold or flulike symptoms, presyncope, syncope.  Admits to heart palpitations.  Reports history of A. fib though recent heart monitor with no A. fib.  She had isolated PACs and couplets, rare episodes of SVT consistent with atrial tachycardia.  Rare ventricular ectopy with isolated PVCs and couplets.  She was started on metoprolol with some improvement.  Denies chest pain.  Denies shortness of breath or cough.  Last drink alcohol about a year ago.  Used to drink somewhat heavily.  Past Medical History:  Diagnosis Date  . Asthma   . Atrial fibrillation ([place])    per patient; however cardiac monitor December 2021 without atrial fibrillation. She had isolated PACs and couplets, rare episodes of SVT consistent with atrial tachycardia.  Rare ventricular ectopy with isolated PVCs and couplets.    . Depression   . Diabetes mellitus   . Hypercholesteremia   . Hypertension   . Noncompliance     Past Surgical History:  Procedure Laterality Date  . CESAREAN SECTION    . TONSILLECTOMY      Current Outpatient Medications  Medication Sig Dispense Refill  . albuterol (VENTOLIN HFA) 108 (90 Base) MCG/ACT inhaler Inhale 2 puffs into the lungs every 6 (six) hours as needed for wheezing or shortness of breath. 3 each 1  . atorvastatin (LIPITOR) 20 MG tablet Take 1 tablet (20 mg total) by mouth daily. 90 tablet 1  . fenofibrate (TRICOR) 145  MG tablet Take 1 tablet (145 mg total) by mouth daily. 30 tablet 4  . Fluticasone-Salmeterol (ADVAIR) 100-50 MCG/DOSE AEPB INHALE 1 PUFF BY MOUTH EVERY MORNING AND EVERY NIGHT AT BEDTIME RINSE MOUTH AFTER USE 120 each 1  . ibuprofen (ADVIL) 800 MG tablet Take 1 tablet (800 mg total) by mouth 3 (three) times daily. Take with food 21 tablet 0  . Insulin Glargine (LANTUS SOLOSTAR) 100 UNIT/ML Solostar Pen Inject 32 Units into the skin at bedtime.    Marland Kitchen lisinopril (ZESTRIL) 10 MG tablet Take 1 tablet (10 mg total) by mouth daily. 90  tablet 1  . metFORMIN (GLUCOPHAGE) 1000 MG tablet TAKE 1 Tablet  BY MOUTH TWICE DAILY WITH A MEAL (Patient taking differently: Take 1,000 mg by mouth daily with breakfast.) 180 tablet 0  . methocarbamol (ROBAXIN) 500 MG tablet Take 1 tablet (500 mg total) by mouth 3 (three) times daily. 21 tablet 0  . metoprolol tartrate (LOPRESSOR) 50 MG tablet Take 1 tablet (50 mg total) by mouth 2 (two) times daily. 180 tablet 0  . omeprazole (PRILOSEC) 40 MG capsule Take 1 capsule (40 mg total) by mouth 2 (two) times daily before a meal. 60 capsule 3  . triamcinolone cream (KENALOG) 0.1 % APPLY TO THE AFFECTED AREA TWICE DAILY AS NEEDED (Patient taking differently: Apply 1 application topically 2 (two) times daily.) 60 g 0   No current facility-administered medications for this visit.    Allergies as of 01/13/2021 - Review Complete 01/13/2021  Allergen Reaction Noted  . Bee venom Anaphylaxis and Swelling 07/04/2013  . Buspar [buspirone] Itching and Anxiety 08/10/2017  . Latex Itching 09/21/2015  . Reglan [metoclopramide]  04/12/2017  . Sulfa antibiotics Itching and Rash 11/22/2011    Family History  Problem Relation Age of Onset  . Hypertension Paternal Grandmother   . Hypertension Maternal Grandmother   . Diabetes Father   . Hypertension Father   . Cancer Mother        liver  . Heart attack Paternal Grandfather   . Diabetes Brother   . Colon cancer Neg Hx     Social History   Socioeconomic History  . Marital status: Single    Spouse name: Not on file  . Number of children: Not on file  . Years of education: Not on file  . Highest education level: Not on file  Occupational History  . Not on file  Tobacco Use  . Smoking status: Current Every Day Smoker    Packs/day: 0.50    Years: 20.00    Pack years: 10.00    Types: Cigarettes  . Smokeless tobacco: Never Used  Vaping Use  . Vaping Use: Never used  Substance and Sexual Activity  . Alcohol use: Not Currently    Comment: Weekend  -- almost a 5th; last drank over 1 year ago (01/13/21)  . Drug use: No  . Sexual activity: Yes    Birth control/protection: None  Other Topics Concern  . Not on file  Social History Narrative  . Not on file   Social Determinants of Health   Financial Resource Strain: Not on file  Food Insecurity: Not on file  Transportation Needs: Not on file  Physical Activity: Not on file  Stress: Not on file  Social Connections: Not on file  Intimate Partner Violence: Not on file    Review of Systems: Gen: See HPI CV: See HPI Resp: See HPI GI: See HPI GU : Denies urinary burning, urinary frequency, urinary hesitancy  MS: See HPI Derm: Denies rash Psych: Denies depression, anxiety Heme: See HPI  Physical Exam: BP (!) 151/90   Pulse 89   Temp (!) 97.1 F (36.2 C)   Ht 5\' 6"  (1.676 m)   Wt 195 lb 12.8 oz (88.8 kg)   LMP 01/04/2021   BMI 31.60 kg/m  General:   Alert and oriented. Pleasant and cooperative. Well-nourished and well-developed.  Head:  Normocephalic and atraumatic. Eyes:  Without icterus, sclera clear and conjunctiva pink.  Ears:  Normal auditory acuity. Lungs:  Clear to auscultation bilaterally. No wheezes, rales, or rhonchi. No distress.  Heart:  S1, S2 present without murmurs appreciated.  Abdomen:  +BS, soft, and non-distended.  Mild generalized TTP.  Some increased TTP in the RUQ. No HSM noted. No guarding or rebound. No masses appreciated.  Rectal:  Deferred  Msk:  Symmetrical without gross deformities. Normal posture. Extremities:  Without edema. Neurologic:  Alert and  oriented x4;  grossly normal neurologically. Skin:  Intact without significant lesions or rashes. Psych:  Normal mood and affect.   Assessment: 45 year old female presenting today with multiple GI complaints including constipation, epigastric/LUQ abdominal pain, nausea with vomiting, and dysphagia.  Also with history of GERD.  Recent CT March 2022 with cholelithiasis without cholecystitis, no  other significant findings.  Constipation: Chronic.  Bowels move every 2 to 3 weeks.  Once her bowels start to move, she will have diarrhea with 3-4 bowel movements per day, likely overflow diarrhea.  Denies BRBPR or melena.  Associated nausea with intermittent vomiting and LUQ abdominal pain as constipation worsens, improves with BMs.  Not currently using anything to help with bowel regularity. We will plan to try her on Linzess 290 mcg daily.   Upper abdominal pain: 1 year history of constant epigastric/LUQ abdominal pain that can be worsened by meals, constipation, or movement at times. Also with daily nausea with intermittent vomiting which she attributes primarily to constipation.  History of GERD with breakthrough symptoms a couple times a week on Prilosec 40 mg daily.  On exam, she has mild generalized tenderness palpation with some increased tenderness in the right upper quadrant.  Symptoms likely multifactorial in setting of chronic uncontrolled constipation, daily NSAIDs, possible esophagitis, gastritis, duodenitis, PUD, or H. Pylori.  Cannot rule out biliary etiology with cholelithiasis noted on CT.  We will plan to update labs, RUQ ultrasound, increase PPI to twice daily, and pursue EGD for further evaluation.  If she continues with symptoms moving forward and [place] and EGD are unrevealing, consider HIDA.   Dysphagia: Solid food dysphagia.  This is in the setting of chronic GERD with breakthrough symptoms a couple times a week on Prilosec 40 mg daily.  Will increase PPI to twice daily and proceed with EGD with possible dilation.  Plan: 1.  CBC, CMP, lipase.  2.  RUQ ultrasound.  3.  EGD +/- dilation with propofol with Dr. Gala Romney in the near future. The risks, benefits, and alternatives have been discussed with the patient in detail. The patient states understanding and desires to proceed.  ASA II See separate instructions for diabetes medication adjustments.  4.  Increase Prilosec to 40 mg  twice daily 30 minutes before breakfast and dinner.  Prescription sent to pharmacy.  5.  Stop all NSAIDs.  6.  Use Tylenol as needed.  No more than 3000 mg/day.  7.  Stop dicyclomine.  8.  Linzess 290 mcg daily 30 minutes before breakfast.  Samples provided.  Requested progress report in  1-2 weeks.  9.  Follow-up after procedure.     Aliene Altes, PA-C [facility] 01/13/2021

## 2021-01-11 NOTE — Progress Notes (Signed)
Referring Provider: Soyla Dryer, PA-C Primary Care Physician:  Soyla Dryer, PA-C Primary Gastroenterologist:  Dr. Gala Romney  Chief Complaint  Patient presents with  . Diarrhea  . Abdominal Pain    LUQ pain, all the time  . Nausea    No vomiting    HPI:   Shari Collins is a 45 y.o. female presenting today at the request of Soyla Dryer, PA-C for LUQ abdominal pain and diarrhea.  CT abdomen with and without contrast and pelvis with contrast 11/05/2020 for left-sided abdominal pain and diarrhea with cholelithiasis without cholecystitis, no biliary abnormalities, pancreas and spleen appeared normal, stomach, small bowel, colon normal, bilateral adrenal adenomas without substantial change.  Plan to decrease metformin to 500 mg twice daily and increase Lantus to 35 units daily.  She had follow-up with Soyla Dryer, PA-C on 3/28.  She continued with abdominal pain.  Diarrhea some days.  She was referred to GI for further evaluation.  Today: Epigastric/LUQ abdominal pain x1 year. Burning. Feels like a rubber band popping. Constant.  Can be worsened by eating.  No alleviating factors.  PCP started her on dicyclomine which is not helping.  Also reports pain can be affected by movement at times.  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  History of GERD on Prilosec 40 mg daily.  Breakthrough symptoms a couple times a week.  Limits fried/fatty foods.  Some spicy foods.  Soda daily.  Admits to solid food dysphagia.  Takes ibuprofen 800 mg 1-2 times a day for neck pain.  Also with daily nausea, rare vomiting.  Nausea is associated with constipation.  States she can go 2 to 3 weeks without a bowel movement.  When she finally starts to move her bowels, she has diarrhea.  With diarrhea, she will have 3-4 bowel movements per day.  No BRBPR or melena.  Left-sided pain will worsen with constipation as well.  Constipation is chronic since she was a child.  She does not take  anything for this.  Denies fever, chills, cold or flulike symptoms, presyncope, syncope.  Admits to heart palpitations.  Reports history of A. fib though recent heart monitor with no A. fib.  She had isolated PACs and couplets, rare episodes of SVT consistent with atrial tachycardia.  Rare ventricular ectopy with isolated PVCs and couplets.  She was started on metoprolol with some improvement.  Denies chest pain.  Denies shortness of breath or cough.  Last drink alcohol about a year ago.  Used to drink somewhat heavily.  Past Medical History:  Diagnosis Date  . Asthma   . Atrial fibrillation ([place])    per patient; however cardiac monitor December 2021 without atrial fibrillation. She had isolated PACs and couplets, rare episodes of SVT consistent with atrial tachycardia.  Rare ventricular ectopy with isolated PVCs and couplets.    . Depression   . Diabetes mellitus   . Hypercholesteremia   . Hypertension   . Noncompliance     Past Surgical History:  Procedure Laterality Date  . CESAREAN SECTION    . TONSILLECTOMY      Current Outpatient Medications  Medication Sig Dispense Refill  . albuterol (VENTOLIN HFA) 108 (90 Base) MCG/ACT inhaler Inhale 2 puffs into the lungs every 6 (six) hours as needed for wheezing or shortness of breath. 3 each 1  . atorvastatin (LIPITOR) 20 MG tablet Take 1 tablet (20 mg total) by mouth daily. 90 tablet 1  . fenofibrate (TRICOR) 145  MG tablet Take 1 tablet (145 mg total) by mouth daily. 30 tablet 4  . Fluticasone-Salmeterol (ADVAIR) 100-50 MCG/DOSE AEPB INHALE 1 PUFF BY MOUTH EVERY MORNING AND EVERY NIGHT AT BEDTIME RINSE MOUTH AFTER USE 120 each 1  . ibuprofen (ADVIL) 800 MG tablet Take 1 tablet (800 mg total) by mouth 3 (three) times daily. Take with food 21 tablet 0  . Insulin Glargine (LANTUS SOLOSTAR) 100 UNIT/ML Solostar Pen Inject 32 Units into the skin at bedtime.    Marland Kitchen lisinopril (ZESTRIL) 10 MG tablet Take 1 tablet (10 mg total) by mouth daily. 90  tablet 1  . metFORMIN (GLUCOPHAGE) 1000 MG tablet TAKE 1 Tablet  BY MOUTH TWICE DAILY WITH A MEAL (Patient taking differently: Take 1,000 mg by mouth daily with breakfast.) 180 tablet 0  . methocarbamol (ROBAXIN) 500 MG tablet Take 1 tablet (500 mg total) by mouth 3 (three) times daily. 21 tablet 0  . metoprolol tartrate (LOPRESSOR) 50 MG tablet Take 1 tablet (50 mg total) by mouth 2 (two) times daily. 180 tablet 0  . omeprazole (PRILOSEC) 40 MG capsule Take 1 capsule (40 mg total) by mouth 2 (two) times daily before a meal. 60 capsule 3  . triamcinolone cream (KENALOG) 0.1 % APPLY TO THE AFFECTED AREA TWICE DAILY AS NEEDED (Patient taking differently: Apply 1 application topically 2 (two) times daily.) 60 g 0   No current facility-administered medications for this visit.    Allergies as of 01/13/2021 - Review Complete 01/13/2021  Allergen Reaction Noted  . Bee venom Anaphylaxis and Swelling 07/04/2013  . Buspar [buspirone] Itching and Anxiety 08/10/2017  . Latex Itching 09/21/2015  . Reglan [metoclopramide]  04/12/2017  . Sulfa antibiotics Itching and Rash 11/22/2011    Family History  Problem Relation Age of Onset  . Hypertension Paternal Grandmother   . Hypertension Maternal Grandmother   . Diabetes Father   . Hypertension Father   . Cancer Mother        liver  . Heart attack Paternal Grandfather   . Diabetes Brother   . Colon cancer Neg Hx     Social History   Socioeconomic History  . Marital status: Single    Spouse name: Not on file  . Number of children: Not on file  . Years of education: Not on file  . Highest education level: Not on file  Occupational History  . Not on file  Tobacco Use  . Smoking status: Current Every Day Smoker    Packs/day: 0.50    Years: 20.00    Pack years: 10.00    Types: Cigarettes  . Smokeless tobacco: Never Used  Vaping Use  . Vaping Use: Never used  Substance and Sexual Activity  . Alcohol use: Not Currently    Comment: Weekend  -- almost a 5th; last drank over 1 year ago (01/13/21)  . Drug use: No  . Sexual activity: Yes    Birth control/protection: None  Other Topics Concern  . Not on file  Social History Narrative  . Not on file   Social Determinants of Health   Financial Resource Strain: Not on file  Food Insecurity: Not on file  Transportation Needs: Not on file  Physical Activity: Not on file  Stress: Not on file  Social Connections: Not on file  Intimate Partner Violence: Not on file    Review of Systems: Gen: See HPI CV: See HPI Resp: See HPI GI: See HPI GU : Denies urinary burning, urinary frequency, urinary hesitancy  MS: See HPI Derm: Denies rash Psych: Denies depression, anxiety Heme: See HPI  Physical Exam: BP (!) 151/90   Pulse 89   Temp (!) 97.1 F (36.2 C)   Ht 5\' 6"  (1.676 m)   Wt 195 lb 12.8 oz (88.8 kg)   LMP 01/04/2021   BMI 31.60 kg/m  General:   Alert and oriented. Pleasant and cooperative. Well-nourished and well-developed.  Head:  Normocephalic and atraumatic. Eyes:  Without icterus, sclera clear and conjunctiva pink.  Ears:  Normal auditory acuity. Lungs:  Clear to auscultation bilaterally. No wheezes, rales, or rhonchi. No distress.  Heart:  S1, S2 present without murmurs appreciated.  Abdomen:  +BS, soft, and non-distended.  Mild generalized TTP.  Some increased TTP in the RUQ. No HSM noted. No guarding or rebound. No masses appreciated.  Rectal:  Deferred  Msk:  Symmetrical without gross deformities. Normal posture. Extremities:  Without edema. Neurologic:  Alert and  oriented x4;  grossly normal neurologically. Skin:  Intact without significant lesions or rashes. Psych:  Normal mood and affect.   Assessment: 45 year old female presenting today with multiple GI complaints including constipation, epigastric/LUQ abdominal pain, nausea with vomiting, and dysphagia.  Also with history of GERD.  Recent CT March 2022 with cholelithiasis without cholecystitis, no  other significant findings.  Constipation: Chronic.  Bowels move every 2 to 3 weeks.  Once her bowels start to move, she will have diarrhea with 3-4 bowel movements per day, likely overflow diarrhea.  Denies BRBPR or melena.  Associated nausea with intermittent vomiting and LUQ abdominal pain as constipation worsens, improves with BMs.  Not currently using anything to help with bowel regularity. We will plan to try her on Linzess 290 mcg daily.   Upper abdominal pain: 1 year history of constant epigastric/LUQ abdominal pain that can be worsened by meals, constipation, or movement at times. Also with daily nausea with intermittent vomiting which she attributes primarily to constipation.  History of GERD with breakthrough symptoms a couple times a week on Prilosec 40 mg daily.  On exam, she has mild generalized tenderness palpation with some increased tenderness in the right upper quadrant.  Symptoms likely multifactorial in setting of chronic uncontrolled constipation, daily NSAIDs, possible esophagitis, gastritis, duodenitis, PUD, or H. Pylori.  Cannot rule out biliary etiology with cholelithiasis noted on CT.  We will plan to update labs, RUQ ultrasound, increase PPI to twice daily, and pursue EGD for further evaluation.  If she continues with symptoms moving forward and [place] and EGD are unrevealing, consider HIDA.   Dysphagia: Solid food dysphagia.  This is in the setting of chronic GERD with breakthrough symptoms a couple times a week on Prilosec 40 mg daily.  Will increase PPI to twice daily and proceed with EGD with possible dilation.  Plan: 1.  CBC, CMP, lipase.  2.  RUQ ultrasound.  3.  EGD +/- dilation with propofol with Dr. Gala Romney in the near future. The risks, benefits, and alternatives have been discussed with the patient in detail. The patient states understanding and desires to proceed.  ASA II See separate instructions for diabetes medication adjustments.  4.  Increase Prilosec to 40 mg  twice daily 30 minutes before breakfast and dinner.  Prescription sent to pharmacy.  5.  Stop all NSAIDs.  6.  Use Tylenol as needed.  No more than 3000 mg/day.  7.  Stop dicyclomine.  8.  Linzess 290 mcg daily 30 minutes before breakfast.  Samples provided.  Requested progress report in  1-2 weeks.  9.  Follow-up after procedure.     Aliene Altes, PA-C [facility] 01/13/2021

## 2021-01-13 ENCOUNTER — Encounter: Payer: Self-pay | Admitting: Gastroenterology

## 2021-01-13 ENCOUNTER — Telehealth: Payer: Self-pay

## 2021-01-13 ENCOUNTER — Ambulatory Visit (INDEPENDENT_AMBULATORY_CARE_PROVIDER_SITE_OTHER): Payer: Self-pay | Admitting: Gastroenterology

## 2021-01-13 ENCOUNTER — Other Ambulatory Visit (HOSPITAL_COMMUNITY)
Admission: RE | Admit: 2021-01-13 | Discharge: 2021-01-13 | Disposition: A | Discharge status: Home / Self Care | Payer: Self-pay | Source: Ambulatory Visit | Attending: Gastroenterology | Admitting: Gastroenterology

## 2021-01-13 ENCOUNTER — Other Ambulatory Visit: Payer: Self-pay

## 2021-01-13 VITALS — BP 151/90 | HR 89 | Temp 97.1°F | Ht 66.0 in | Wt 195.8 lb

## 2021-01-13 DIAGNOSIS — K5900 Constipation, unspecified: Secondary | ICD-10-CM

## 2021-01-13 DIAGNOSIS — R1010 Upper abdominal pain, unspecified: Secondary | ICD-10-CM | POA: Insufficient documentation

## 2021-01-13 DIAGNOSIS — R1110 Vomiting, unspecified: Secondary | ICD-10-CM | POA: Insufficient documentation

## 2021-01-13 DIAGNOSIS — K8020 Calculus of gallbladder without cholecystitis without obstruction: Secondary | ICD-10-CM

## 2021-01-13 DIAGNOSIS — R1310 Dysphagia, unspecified: Secondary | ICD-10-CM | POA: Insufficient documentation

## 2021-01-13 DIAGNOSIS — R112 Nausea with vomiting, unspecified: Secondary | ICD-10-CM

## 2021-01-13 DIAGNOSIS — K219 Gastro-esophageal reflux disease without esophagitis: Secondary | ICD-10-CM

## 2021-01-13 LAB — COMPREHENSIVE METABOLIC PANEL
ALT: 15 U/L (ref 0–44)
AST: 13 U/L — ABNORMAL LOW (ref 15–41)
Albumin: 3.9 g/dL (ref 3.5–5.0)
Alkaline Phosphatase: 49 U/L (ref 38–126)
Anion gap: 6 (ref 5–15)
BUN: 12 mg/dL (ref 6–20)
CO2: 27 mmol/L (ref 22–32)
Calcium: 9.1 mg/dL (ref 8.9–10.3)
Chloride: 104 mmol/L (ref 98–111)
Creatinine, Ser: 0.54 mg/dL (ref 0.44–1.00)
GFR, Estimated: >60 mL/min (ref >60)
Glucose, Bld: 137 mg/dL — ABNORMAL HIGH (ref 70–99)
Potassium: 4.3 mmol/L (ref 3.5–5.1)
Sodium: 137 mmol/L (ref 135–145)
Total Bilirubin: 0.6 mg/dL (ref 0.3–1.2)
Total Protein: 7 g/dL (ref 6.5–8.1)

## 2021-01-13 LAB — CBC WITH DIFFERENTIAL/PLATELET
Abs Immature Granulocytes: 0.04 10*3/uL (ref 0.00–0.07)
Basophils Absolute: 0.1 10*3/uL (ref 0.0–0.1)
Basophils Relative: 1 %
Eosinophils Absolute: 1.1 10*3/uL — ABNORMAL HIGH (ref 0.0–0.5)
Eosinophils Relative: 12 %
HCT: 42.8 % (ref 36.0–46.0)
Hemoglobin: 14 g/dL (ref 12.0–15.0)
Immature Granulocytes: 0 %
Lymphocytes Relative: 20 %
Lymphs Abs: 1.9 10*3/uL (ref 0.7–4.0)
MCH: 29.3 pg (ref 26.0–34.0)
MCHC: 32.7 g/dL (ref 30.0–36.0)
MCV: 89.5 fL (ref 80.0–100.0)
Monocytes Absolute: 0.4 10*3/uL (ref 0.1–1.0)
Monocytes Relative: 4 %
Neutro Abs: 6 10*3/uL (ref 1.7–7.7)
Neutrophils Relative %: 63 %
Platelets: 374 10*3/uL (ref 150–400)
RBC: 4.78 MIL/uL (ref 3.87–5.11)
RDW: 15.4 % (ref 11.5–15.5)
WBC: 9.6 10*3/uL (ref 4.0–10.5)
nRBC: 0 % (ref 0.0–0.2)

## 2021-01-13 LAB — LIPASE, BLOOD: Lipase: 25 U/L (ref 11–51)

## 2021-01-13 MED ORDER — OMEPRAZOLE 40 MG PO CPDR: 40.0000 mg | DELAYED_RELEASE_CAPSULE | Freq: Two times a day (BID) | ORAL | 3 refills | Status: DC

## 2021-01-13 NOTE — Telephone Encounter (Signed)
Will call pt to schedule EGD/-/+DIL w/Propofol w/Dr. Gala Romney ASA 2 when his next schedule is available. She's diabetic.

## 2021-01-13 NOTE — Patient Instructions (Addendum)
Have labs completed at [facility].  We will arrange for you to have an ultrasound of your gallbladder in the very near future at [facility].  We will arrange for you to have an upper endoscopy with possible dilation of your esophagus in the future with Dr. Gala Romney. The night before your procedure, take one half dose of your insulin (16 units) at bedtime The morning of your procedure, do not take metformin.  Increase omeprazole (Prilosec) to 40 mg twice daily 30 minutes before breakfast and dinner.  Please stop using all NSAID products including ibuprofen, Aleve, Advil, Goody powders, BC powders, and anything that says "NSAID" on the package.  You may use Tylenol if needed.  No more than 3000 mg/day.  You may also stop dicyclomine as this is not helping with your abdominal pain.  This medication can also contribute to constipation.  For constipation: Start Linzess 290 mcg daily 30 minutes before your first meal.  We are providing you with samples. Please call with a progress report in 1-2 weeks.  If this medication works well, we will send in a prescription.  As you do not have insurance at this time, you will need to fill out patient assistance paperwork for this if it works well.  We can provide prescription for you.  We will plan to see you back after your procedures.  Please call with questions or concerns prior.

## 2021-01-14 ENCOUNTER — Other Ambulatory Visit: Payer: Self-pay

## 2021-01-14 ENCOUNTER — Ambulatory Visit (HOSPITAL_COMMUNITY)
Admission: RE | Admit: 2021-01-14 | Discharge: 2021-01-14 | Disposition: A | Discharge status: Home / Self Care | Payer: Self-pay | Source: Ambulatory Visit | Attending: Gastroenterology | Admitting: Gastroenterology

## 2021-01-14 ENCOUNTER — Other Ambulatory Visit: Payer: Self-pay | Admitting: Physician Assistant

## 2021-01-14 DIAGNOSIS — E118 Type 2 diabetes mellitus with unspecified complications: Secondary | ICD-10-CM

## 2021-01-14 DIAGNOSIS — R112 Nausea with vomiting, unspecified: Secondary | ICD-10-CM | POA: Insufficient documentation

## 2021-01-14 DIAGNOSIS — R1010 Upper abdominal pain, unspecified: Secondary | ICD-10-CM | POA: Insufficient documentation

## 2021-01-14 DIAGNOSIS — K8020 Calculus of gallbladder without cholecystitis without obstruction: Secondary | ICD-10-CM | POA: Insufficient documentation

## 2021-01-14 DIAGNOSIS — E785 Hyperlipidemia, unspecified: Secondary | ICD-10-CM

## 2021-01-14 NOTE — Progress Notes (Signed)
Cc'ed to pcp °

## 2021-01-17 ENCOUNTER — Other Ambulatory Visit: Payer: Self-pay | Admitting: Physician Assistant

## 2021-01-22 ENCOUNTER — Telehealth: Payer: Self-pay

## 2021-01-22 NOTE — Telephone Encounter (Signed)
Dr. Gala Romney had cancellation for procedure 01/30/21. Tried to call pt, LMOVM for return call if she would be able to do procedure 01/30/21.

## 2021-01-23 NOTE — Telephone Encounter (Signed)
Spoke to pt, EGD/-/+DIL scheduled for 01/30/21 at 12:30pm. COVID test 01/29/21 at 11:25am. She has access to [facility]. Appt letter and procedure instructions sent to pt via Mychart and mailed. Orders entered.

## 2021-01-29 ENCOUNTER — Other Ambulatory Visit: Payer: Self-pay

## 2021-01-29 ENCOUNTER — Other Ambulatory Visit (HOSPITAL_COMMUNITY)
Admission: RE | Admit: 2021-01-29 | Discharge: 2021-01-29 | Disposition: A | Discharge status: Home / Self Care | Payer: Self-pay | Source: Ambulatory Visit | Attending: Internal Medicine | Admitting: Internal Medicine

## 2021-01-29 ENCOUNTER — Ambulatory Visit: Payer: Self-pay | Admitting: Physician Assistant

## 2021-01-29 DIAGNOSIS — Z01812 Encounter for preprocedural laboratory examination: Secondary | ICD-10-CM | POA: Insufficient documentation

## 2021-01-29 DIAGNOSIS — Z20822 Contact with and (suspected) exposure to covid-19: Secondary | ICD-10-CM | POA: Insufficient documentation

## 2021-01-29 LAB — PREGNANCY, URINE: Preg Test, Ur: NEGATIVE

## 2021-01-30 ENCOUNTER — Encounter (HOSPITAL_COMMUNITY): Payer: Self-pay | Admitting: Internal Medicine

## 2021-01-30 ENCOUNTER — Ambulatory Visit (HOSPITAL_COMMUNITY): Payer: Self-pay | Admitting: Anesthesiology

## 2021-01-30 ENCOUNTER — Telehealth: Payer: Self-pay

## 2021-01-30 ENCOUNTER — Other Ambulatory Visit: Payer: Self-pay

## 2021-01-30 ENCOUNTER — Ambulatory Visit (HOSPITAL_COMMUNITY)
Admission: RE | Admit: 2021-01-30 | Discharge: 2021-01-30 | Disposition: A | Discharge status: Home / Self Care | Payer: Self-pay | Attending: Internal Medicine | Admitting: Internal Medicine

## 2021-01-30 ENCOUNTER — Encounter (HOSPITAL_COMMUNITY)
Admission: RE | Disposition: A | Discharge status: Home / Self Care | Payer: Self-pay | Source: Home / Self Care | Attending: Internal Medicine

## 2021-01-30 DIAGNOSIS — Z79899 Other long term (current) drug therapy: Secondary | ICD-10-CM | POA: Insufficient documentation

## 2021-01-30 DIAGNOSIS — Z91040 Latex allergy status: Secondary | ICD-10-CM | POA: Insufficient documentation

## 2021-01-30 DIAGNOSIS — I471 Supraventricular tachycardia: Secondary | ICD-10-CM | POA: Insufficient documentation

## 2021-01-30 DIAGNOSIS — Z91030 Bee allergy status: Secondary | ICD-10-CM | POA: Insufficient documentation

## 2021-01-30 DIAGNOSIS — F17210 Nicotine dependence, cigarettes, uncomplicated: Secondary | ICD-10-CM | POA: Insufficient documentation

## 2021-01-30 DIAGNOSIS — Z7984 Long term (current) use of oral hypoglycemic drugs: Secondary | ICD-10-CM | POA: Insufficient documentation

## 2021-01-30 DIAGNOSIS — R1013 Epigastric pain: Secondary | ICD-10-CM | POA: Insufficient documentation

## 2021-01-30 DIAGNOSIS — Z882 Allergy status to sulfonamides status: Secondary | ICD-10-CM | POA: Insufficient documentation

## 2021-01-30 DIAGNOSIS — R1310 Dysphagia, unspecified: Secondary | ICD-10-CM | POA: Insufficient documentation

## 2021-01-30 DIAGNOSIS — K219 Gastro-esophageal reflux disease without esophagitis: Secondary | ICD-10-CM | POA: Insufficient documentation

## 2021-01-30 DIAGNOSIS — Z794 Long term (current) use of insulin: Secondary | ICD-10-CM | POA: Insufficient documentation

## 2021-01-30 DIAGNOSIS — Z7951 Long term (current) use of inhaled steroids: Secondary | ICD-10-CM | POA: Insufficient documentation

## 2021-01-30 DIAGNOSIS — Z888 Allergy status to other drugs, medicaments and biological substances status: Secondary | ICD-10-CM | POA: Insufficient documentation

## 2021-01-30 DIAGNOSIS — K5909 Other constipation: Secondary | ICD-10-CM | POA: Insufficient documentation

## 2021-01-30 HISTORY — PX: ESOPHAGOGASTRODUODENOSCOPY (EGD) WITH PROPOFOL: SHX5813

## 2021-01-30 HISTORY — PX: MALONEY DILATION: SHX5535

## 2021-01-30 LAB — SARS CORONAVIRUS 2 (TAT 6-24 HRS): SARS Coronavirus 2: NEGATIVE

## 2021-01-30 LAB — GLUCOSE, CAPILLARY: Glucose-Capillary: 162 mg/dL — ABNORMAL HIGH (ref 70–99)

## 2021-01-30 SURGERY — ESOPHAGOGASTRODUODENOSCOPY (EGD) WITH PROPOFOL
Anesthesia: General

## 2021-01-30 MED ORDER — PROPOFOL 10 MG/ML IV BOLUS
INTRAVENOUS | Status: DC | PRN
  Administered 2021-01-30: 100 mg via INTRAVENOUS
  Administered 2021-01-30 (×2): 200 mg via INTRAVENOUS

## 2021-01-30 MED ORDER — STERILE WATER FOR IRRIGATION IR SOLN
Status: DC | PRN
  Administered 2021-01-30: 100 mL

## 2021-01-30 MED ORDER — LIDOCAINE HCL (CARDIAC) PF 50 MG/5ML IV SOSY
PREFILLED_SYRINGE | INTRAVENOUS | Status: DC | PRN
  Administered 2021-01-30: 50 mg via INTRAVENOUS

## 2021-01-30 MED ORDER — LACTATED RINGERS IV SOLN: INTRAVENOUS | Status: DC

## 2021-01-30 NOTE — Anesthesia Postprocedure Evaluation (Signed)
Anesthesia Post Note  Patient: Shari Collins  Procedure(s) Performed: ESOPHAGOGASTRODUODENOSCOPY (EGD) WITH PROPOFOL (N/A ) MALONEY DILATION (N/A )  Patient location during evaluation: Endoscopy Anesthesia Type: General Level of consciousness: awake and alert and oriented Pain management: pain level controlled Vital Signs Assessment: post-procedure vital signs reviewed and stable Respiratory status: spontaneous breathing and respiratory function stable Cardiovascular status: blood pressure returned to baseline and stable Postop Assessment: no apparent nausea or vomiting Anesthetic complications: no   No complications documented.   Last Vitals:  Vitals:   01/30/21 0648 01/30/21 0753  BP: (!) 145/71 (!) 107/54  Pulse:  61  Resp:  (!) 21  Temp:  36.7 C  SpO2:  98%    Last Pain:  Vitals:   01/30/21 0753  TempSrc: Oral  PainSc: 0-No pain                 Arafat Cocuzza C Jahan Friedlander

## 2021-01-30 NOTE — Telephone Encounter (Signed)
FYI:  Dr. Sydell Axon changed this pt's Linzess to 72 mcg this morning after procedure and she came to the office to get samples. You had given samples of 290 mcg and pt had advised me they were too strong. Pt was to report how they were doing.

## 2021-01-30 NOTE — Discharge Instructions (Signed)
EGD Discharge instructions Please read the instructions outlined below and refer to this sheet in the next few weeks. These discharge instructions provide you with general information on caring for yourself after you leave the hospital. Your doctor may also give you specific instructions. While your treatment has been planned according to the most current medical practices available, unavoidable complications occasionally occur. If you have any problems or questions after discharge, please call your doctor. ACTIVITY  You may resume your regular activity but move at a slower pace for the next 24 hours.   Take frequent rest periods for the next 24 hours.   Walking will help expel (get rid of) the air and reduce the bloated feeling in your abdomen.   No driving for 24 hours (because of the anesthesia (medicine) used during the test).   You may shower.   Do not sign any important legal documents or operate any machinery for 24 hours (because of the anesthesia used during the test).  NUTRITION  Drink plenty of fluids.   You may resume your normal diet.   Begin with a light meal and progress to your normal diet.   Avoid alcoholic beverages for 24 hours or as instructed by your caregiver.  MEDICATIONS  You may resume your normal medications unless your caregiver tells you otherwise.  WHAT YOU CAN EXPECT TODAY  You may experience abdominal discomfort such as a feeling of fullness or "gas" pains.  FOLLOW-UP  Your doctor will discuss the results of your test with you.  SEEK IMMEDIATE MEDICAL ATTENTION IF ANY OF THE FOLLOWING OCCUR:  Excessive nausea (feeling sick to your stomach) and/or vomiting.   Severe abdominal pain and distention (swelling).   Trouble swallowing.   Temperature over 101 F (37.8 C).   Rectal bleeding or vomiting of blood.   Your EGD was normal today.  I did dilate your esophagus.  Continue omeprazole 40 mg twice daily  For bowel function, try Linzess 72 1  capsule daily.  Go by my office for free samples.  Try free samples samples before you get your prescription filled  Office visit with Aliene Altes in 6 weeks  I recommend losing 15 pounds between now and the end of this calendar year   At patient request, I called Ralph Leyden at [phone] -reviewed results and recommendations

## 2021-01-30 NOTE — Transfer of Care (Signed)
Immediate Anesthesia Transfer of Care Note  Patient: Shari Collins  Procedure(s) Performed: ESOPHAGOGASTRODUODENOSCOPY (EGD) WITH PROPOFOL (N/A ) MALONEY DILATION (N/A )  Patient Location: Endoscopy Unit  Anesthesia Type:General  Level of Consciousness: awake and patient cooperative  Airway & Oxygen Therapy: Patient Spontanous Breathing  Post-op Assessment: Report given to RN and Post -op Vital signs reviewed and stable  Post vital signs: Reviewed and stable  Last Vitals:  Vitals Value Taken Time  BP    Temp 98.1   Pulse    Resp    SpO2      Last Pain:  Vitals:   01/30/21 0732  TempSrc:   PainSc: 7       Patients Stated Pain Goal: 7 (14/10/30 1314)  Complications: No complications documented.

## 2021-01-30 NOTE — Op Note (Signed)
[facility] Patient Name: Shari Collins Procedure Date: 01/30/2021 7:12 AM MRN: [id number] Date of Birth: 03-25-1976 Attending MD: Norvel Richards , MD CSN: [id number] Age: 45 Admit Type: Outpatient Procedure:                Upper GI endoscopy Indications:              Epigastric abdominal pain, Dysphagia Providers:                Norvel Richards, MD, Crystal Page, [place]                            Risa Grill, Technician Referring MD:              Medicines:                Propofol per Anesthesia Complications:            No immediate complications. Estimated Blood Loss:     Estimated blood loss: none. Procedure:                Pre-Anesthesia Assessment:                           - Prior to the procedure, a History and Physical                            was performed, and patient medications and                            allergies were reviewed. The patient's tolerance of                            previous anesthesia was also reviewed. The risks                            and benefits of the procedure and the sedation                            options and risks were discussed with the patient.                            All questions were answered, and informed consent                            was obtained. Prior Anticoagulants: The patient has                            taken no previous anticoagulant or antiplatelet                            agents. ASA Grade Assessment: III - A patient with                            severe systemic disease. After reviewing the risks  and benefits, the patient was deemed in                            satisfactory condition to undergo the procedure.                           After obtaining informed consent, the endoscope was                            passed under direct vision. Throughout the                            procedure, the patient's blood pressure, pulse, and                             oxygen saturations were monitored continuously. The                            GIF-H190 ([id number]) scope was introduced through the                            mouth, and advanced to the second part of duodenum.                            The upper GI endoscopy was accomplished without                            difficulty. The patient tolerated the procedure                            well. Scope In: 7:39:26 AM Scope Out: 7:45:30 AM Total Procedure Duration: 0 hours 6 minutes 4 seconds  Findings:      The examined esophagus was normal.      The entire examined stomach was normal.      The duodenal bulb and second portion of the duodenum were normal. The       scope was withdrawn. Dilation was performed with a Maloney dilator with       no resistance at 56 Fr. The dilation site was examined following       endoscope reinsertion and showed no change. Estimated blood loss: none. Impression:               - Normal esophagus. Dilated.                           - Normal stomach.                           - Normal duodenal bulb and second portion of the                            duodenum.                           - No specimens collected. Patient's upper abdominal  pain improved on twice daily omeprazole. Linzess to                            90 too strong for her produce diarrhea. Moderate Sedation:      Moderate (conscious) sedation was personally administered by an       anesthesia professional. The following parameters were monitored: oxygen       saturation, heart rate, blood pressure, respiratory rate, EKG, adequacy       of pulmonary ventilation, and response to care. Recommendation:           - Patient has a contact number available for                            emergencies. The signs and symptoms of potential                            delayed complications were discussed with the                            patient. Return to normal activities tomorrow.                             Written discharge instructions were provided to the                            patient.                           - Advance diet as tolerated.                           - Continue present medications.                           - Return to my office in 6 weeks. Continue                            omeprazole 40 mg twice daily. Linzess 72 1 cap                            daily. Procedure Code(s):        --- Professional ---                           [phone], Esophagogastroduodenoscopy, flexible,                            transoral; diagnostic, including collection of                            specimen(s) by brushing or washing, when performed                            (separate procedure)  43450, Dilation of esophagus, by unguided sound or                            bougie, single or multiple passes Diagnosis Code(s):        --- Professional ---                           R10.13, Epigastric pain                           R13.10, Dysphagia, unspecified CPT copyright 2019 [employer] Medical Association. All rights reserved. The codes documented in this report are preliminary and upon coder review may  be revised to meet current compliance requirements. Cristopher Estimable. Geneveive Furness, MD Norvel Richards, MD 01/30/2021 7:59:03 AM This report has been signed electronically. Number of Addenda: 0

## 2021-01-30 NOTE — Anesthesia Preprocedure Evaluation (Addendum)
Anesthesia Evaluation  Patient identified by MRN, date of birth, ID band Patient awake    Reviewed: Allergy & Precautions, NPO status , Patient's Chart, lab work & pertinent test results, reviewed documented beta blocker date and time   Airway Mallampati: II  TM Distance: >3 FB Neck ROM: Full    Dental  (+) Dental Advisory Given, Chipped   Pulmonary asthma , Current Smoker and Patient abstained from smoking.,  Snoring    Pulmonary exam normal breath sounds clear to auscultation       Cardiovascular Exercise Tolerance: Good hypertension, Pt. on medications and Pt. on home beta blockers Normal cardiovascular exam+ dysrhythmias Atrial Fibrillation  Rhythm:Regular Rate:Normal     Neuro/Psych PSYCHIATRIC DISORDERS Depression Bipolar Disorder Schizophrenia    GI/Hepatic Neg liver ROS, GERD (vomitings)  Medicated and Controlled,  Endo/Other  diabetes, Well Controlled, Type 2, Oral Hypoglycemic Agents, Insulin Dependent  Renal/GU negative Renal ROS     Musculoskeletal negative musculoskeletal ROS (+)   Abdominal   Peds  Hematology negative hematology ROS (+)   Anesthesia Other Findings Snoring   Reproductive/Obstetrics negative OB ROS                            Anesthesia Physical Anesthesia Plan  ASA: II  Anesthesia Plan: General   Post-op Pain Management:    Induction:   PONV Risk Score and Plan:   Airway Management Planned: Nasal Cannula and Natural Airway  Additional Equipment:   Intra-op Plan:   Post-operative Plan:   Informed Consent: I have reviewed the patients History and Physical, chart, labs and discussed the procedure including the risks, benefits and alternatives for the proposed anesthesia with the patient or authorized representative who has indicated his/her understanding and acceptance.     Dental advisory given  Plan Discussed with: CRNA and  Surgeon  Anesthesia Plan Comments:         Anesthesia Quick Evaluation

## 2021-01-30 NOTE — Interval H&P Note (Signed)
History and Physical Interval Note:  01/30/2021 7:24 AM  Shari Collins  has presented today for surgery, with the diagnosis of GERD, dysphagia, upper abdominal pain, left upper quadrant pain.  The various methods of treatment have been discussed with the patient and family. After consideration of risks, benefits and other options for treatment, the patient has consented to  Procedure(s) with comments: ESOPHAGOGASTRODUODENOSCOPY (EGD) WITH PROPOFOL (N/A) - 12:30pm MALONEY DILATION (N/A) as a surgical intervention.  The patient's history has been reviewed, patient examined, no change in status, stable for surgery.  I have reviewed the patient's chart and labs.  Questions were answered to the patient's satisfaction.     Jeremy Ditullio  Linzess to 34 worked too well produce diarrhea.  Upper abdominal pain improved with twice daily omeprazole.  Cholelithiasis on ultrasound LFTs okay.  Patient is here for EGD with esophageal dilation as feasible/appropriate per plan. The risks, benefits, limitations, alternatives and imponderables have been reviewed with the patient. Potential for esophageal dilation, biopsy, etc. have also been reviewed.  Questions have been answered. All parties agreeable.

## 2021-01-30 NOTE — Anesthesia Procedure Notes (Signed)
Date/Time: 01/30/2021 7:33 AM Performed by: Vista Deck, CRNA Pre-anesthesia Checklist: Patient identified, Emergency Drugs available, Suction available, Timeout performed and Patient being monitored Patient Re-evaluated:Patient Re-evaluated prior to induction Oxygen Delivery Method: Nasal Cannula

## 2021-01-30 NOTE — Telephone Encounter (Signed)
Noted  

## 2021-01-31 ENCOUNTER — Other Ambulatory Visit (HOSPITAL_COMMUNITY)
Admission: RE | Admit: 2021-01-31 | Discharge: 2021-01-31 | Disposition: A | Discharge status: Home / Self Care | Payer: Self-pay | Source: Ambulatory Visit | Attending: Physician Assistant | Admitting: Physician Assistant

## 2021-01-31 ENCOUNTER — Other Ambulatory Visit: Payer: Self-pay

## 2021-01-31 DIAGNOSIS — E118 Type 2 diabetes mellitus with unspecified complications: Secondary | ICD-10-CM | POA: Insufficient documentation

## 2021-01-31 DIAGNOSIS — E785 Hyperlipidemia, unspecified: Secondary | ICD-10-CM | POA: Insufficient documentation

## 2021-01-31 LAB — LIPID PANEL
Cholesterol: 151 mg/dL (ref 0–200)
HDL: 42 mg/dL (ref >40)
LDL Cholesterol: 84 mg/dL (ref 0–99)
Total CHOL/HDL Ratio: 3.6 RATIO
Triglycerides: 125 mg/dL (ref <150)
VLDL: 25 mg/dL (ref 0–40)

## 2021-02-01 LAB — HEMOGLOBIN A1C
Hgb A1c MFr Bld: 7.5 % — ABNORMAL HIGH (ref 4.8–5.6)
Mean Plasma Glucose: 169 mg/dL

## 2021-02-04 ENCOUNTER — Other Ambulatory Visit: Payer: Self-pay

## 2021-02-04 ENCOUNTER — Ambulatory Visit: Payer: Self-pay | Admitting: Physician Assistant

## 2021-02-04 ENCOUNTER — Encounter: Payer: Self-pay | Admitting: Physician Assistant

## 2021-02-04 VITALS — BP 108/68 | HR 63 | Temp 98.4°F | Wt 198.0 lb

## 2021-02-04 DIAGNOSIS — J449 Chronic obstructive pulmonary disease, unspecified: Secondary | ICD-10-CM

## 2021-02-04 DIAGNOSIS — E669 Obesity, unspecified: Secondary | ICD-10-CM

## 2021-02-04 DIAGNOSIS — E785 Hyperlipidemia, unspecified: Secondary | ICD-10-CM

## 2021-02-04 DIAGNOSIS — E1165 Type 2 diabetes mellitus with hyperglycemia: Secondary | ICD-10-CM

## 2021-02-04 DIAGNOSIS — I10 Essential (primary) hypertension: Secondary | ICD-10-CM

## 2021-02-04 DIAGNOSIS — F17200 Nicotine dependence, unspecified, uncomplicated: Secondary | ICD-10-CM

## 2021-02-04 DIAGNOSIS — IMO0002 Reserved for concepts with insufficient information to code with codable children: Secondary | ICD-10-CM

## 2021-02-04 MED ORDER — LANTUS SOLOSTAR 100 UNIT/ML ~~LOC~~ SOPN: 35.0000 [IU] | PEN_INJECTOR | Freq: Every day | SUBCUTANEOUS | Status: DC

## 2021-02-04 NOTE — Progress Notes (Signed)
BP 108/68   Pulse 63   Temp 98.4 F (36.9 C)   Wt 198 lb (89.8 kg)   SpO2 98%   BMI 31.96 kg/m    Subjective:    Patient ID: Shari Collins, female    DOB: 1975/09/16, 45 y.o.   MRN: [id number]  HPI: Shari Collins is a 45 y.o. female presenting on 02/04/2021 for Diabetes, Hyperlipidemia, and Palpitations   HPI     Pt had a negative covid 19 screening questionnaire.   Chief Complaint  Patient presents with  . Diabetes  . Hyperlipidemia  . Palpitations    Pt has no complaints today. She is still smoking. She has not gotten the covid vaccination. She says she can't make it to the DM eye appointment that I called her and told her about last week (scheduled for 02/10/21 at 10:40am)    Relevant past medical, surgical, family and social history reviewed and updated as indicated. Interim medical history since our last visit reviewed. Allergies and medications reviewed and updated.    Current Outpatient Medications:  .  atorvastatin (LIPITOR) 20 MG tablet, Take 1 tablet (20 mg total) by mouth daily., Disp: 90 tablet, Rfl: 1 .  diphenhydrAMINE (BENADRYL) 25 MG tablet, Take 25 mg by mouth daily as needed for allergies., Disp: , Rfl:  .  fenofibrate (TRICOR) 145 MG tablet, Take 1 tablet (145 mg total) by mouth daily., Disp: 30 tablet, Rfl: 4 .  Insulin Glargine (LANTUS SOLOSTAR) 100 UNIT/ML Solostar Pen, Inject 32 Units into the skin at bedtime., Disp: , Rfl:  .  lisinopril (ZESTRIL) 10 MG tablet, Take 1 tablet (10 mg total) by mouth daily., Disp: 90 tablet, Rfl: 1 .  metFORMIN (GLUCOPHAGE) 1000 MG tablet, TAKE 1 Tablet  BY MOUTH TWICE DAILY WITH A MEAL (Patient taking differently: Take 1,000 mg by mouth 2 (two) times daily.), Disp: 180 tablet, Rfl: 0 .  methocarbamol (ROBAXIN) 500 MG tablet, Take 1 tablet (500 mg total) by mouth 3 (three) times daily., Disp: 21 tablet, Rfl: 0 .  metoprolol tartrate (LOPRESSOR) 50 MG tablet, TAKE 1 Tablet  BY MOUTH TWICE DAILY (Patient  taking differently: Take 25 mg by mouth 2 (two) times daily.), Disp: 180 tablet, Rfl: 0 .  omeprazole (PRILOSEC) 40 MG capsule, Take 1 capsule (40 mg total) by mouth 2 (two) times daily before a meal., Disp: 60 capsule, Rfl: 3 .  albuterol (VENTOLIN HFA) 108 (90 Base) MCG/ACT inhaler, Inhale 2 puffs into the lungs every 6 (six) hours as needed for wheezing or shortness of breath. (Patient not taking: Reported on 02/04/2021), Disp: 3 each, Rfl: 1 .  triamcinolone cream (KENALOG) 0.1 %, APPLY TO THE AFFECTED AREA TWICE DAILY AS NEEDED (Patient not taking: Reported on 02/04/2021), Disp: 60 g, Rfl: 0   Review of Systems  Per HPI unless specifically indicated above     Objective:    BP 108/68   Pulse 63   Temp 98.4 F (36.9 C)   Wt 198 lb (89.8 kg)   SpO2 98%   BMI 31.96 kg/m   Wt Readings from Last 3 Encounters:  02/04/21 198 lb (89.8 kg)  01/30/21 195 lb 12.8 oz (88.8 kg)  01/13/21 195 lb 12.8 oz (88.8 kg)    Physical Exam Vitals reviewed.  Constitutional:      Appearance: She is well-developed.  HENT:     Head: Normocephalic and atraumatic.  Cardiovascular:     Rate and Rhythm: Normal rate and regular rhythm.  Pulmonary:     Effort: Pulmonary effort is normal.     Breath sounds: Normal breath sounds.  Abdominal:     General: Bowel sounds are normal.     Palpations: Abdomen is soft. There is no mass.     Tenderness: There is no abdominal tenderness.  Musculoskeletal:     Cervical back: Neck supple.  Lymphadenopathy:     Cervical: No cervical adenopathy.  Skin:    General: Skin is warm and dry.  Neurological:     Mental Status: She is alert and oriented to person, place, and time.  Psychiatric:        Behavior: Behavior normal.     Results for orders placed or performed during the hospital encounter of 01/31/21  Lipid panel  Result Value Ref Range   Cholesterol 151 0 - 200 mg/dL   Triglycerides 125 <150 mg/dL   HDL 42 >40 mg/dL   Total CHOL/HDL Ratio 3.6 RATIO    VLDL 25 0 - 40 mg/dL   LDL Cholesterol 84 0 - 99 mg/dL  Hemoglobin A1c  Result Value Ref Range   Hgb A1c MFr Bld 7.5 (H) 4.8 - 5.6 %   Mean Plasma Glucose 169 mg/dL      Assessment & Plan:    Encounter Diagnoses  Name Primary?  Marland Kitchen Uncontrolled type 2 diabetes mellitus with complication ([place]) Yes  . Hyperlipidemia, unspecified hyperlipidemia type   . Essential hypertension   . Tobacco use disorder   . Chronic obstructive pulmonary disease, unspecified COPD type ([place])   . Obesity, unspecified classification, unspecified obesity type, unspecified whether serious comorbidity present       -pt Can't make it to her DM eye appt.  Eye office will be contacted to determine arrangements for rescheduling -pt was Encouraged to get covid vaccination -pt to increase lantus to 35 units daily and watch diabetic diet -pt to follow up 3 months.  She is to contact office sooner prn

## 2021-02-07 ENCOUNTER — Encounter (HOSPITAL_COMMUNITY): Payer: Self-pay | Admitting: Internal Medicine

## 2021-03-20 ENCOUNTER — Other Ambulatory Visit: Payer: Self-pay | Admitting: Physician Assistant

## 2021-03-24 ENCOUNTER — Other Ambulatory Visit: Payer: Self-pay | Admitting: Physician Assistant

## 2021-03-24 DIAGNOSIS — K219 Gastro-esophageal reflux disease without esophagitis: Secondary | ICD-10-CM

## 2021-03-25 ENCOUNTER — Other Ambulatory Visit: Payer: Self-pay

## 2021-03-25 ENCOUNTER — Ambulatory Visit: Payer: Self-pay | Admitting: Physician Assistant

## 2021-03-25 ENCOUNTER — Encounter: Payer: Self-pay | Admitting: Physician Assistant

## 2021-03-25 VITALS — BP 122/75 | HR 84 | Temp 98.8°F

## 2021-03-25 DIAGNOSIS — R300 Dysuria: Secondary | ICD-10-CM

## 2021-03-25 DIAGNOSIS — N3090 Cystitis, unspecified without hematuria: Secondary | ICD-10-CM

## 2021-03-25 LAB — POCT URINALYSIS DIPSTICK
Bilirubin, UA: NEGATIVE
Glucose, UA: NEGATIVE
Ketones, UA: NEGATIVE
Nitrite, UA: NEGATIVE
Protein, UA: NEGATIVE
Spec Grav, UA: 1.01 (ref 1.010–1.025)
Urobilinogen, UA: 0.2 E.U./dL
pH, UA: 6.5 (ref 5.0–8.0)

## 2021-03-25 MED ORDER — CEPHALEXIN 500 MG PO CAPS: 500.0000 mg | ORAL_CAPSULE | Freq: Four times a day (QID) | ORAL | 0 refills | Status: DC

## 2021-03-25 NOTE — Progress Notes (Signed)
   BP 122/75   Pulse 84   Temp 98.8 F (37.1 C)   SpO2 96%    Subjective:    Patient ID: Shari Collins, female    DOB: 1976-07-14, 45 y.o.   MRN: [id number]  HPI: Shari Collins is a 45 y.o. female presenting on 03/25/2021 for infection (Urinary complaints)   HPI  Pt had a negative covid 19 screening questionnaire.  Chief Complaint  Patient presents with   infection    Urinary complaints   Pt says that urinary symptoms began on Friday.  She also says she has a sore place on her left lower leg.   Relevant past medical, surgical, family and social history reviewed and updated as indicated. Interim medical history since our last visit reviewed. Allergies and medications reviewed and updated.  Review of Systems  Per HPI unless specifically indicated above     Objective:    BP 122/75   Pulse 84   Temp 98.8 F (37.1 C)   SpO2 96%   Wt Readings from Last 3 Encounters:  02/04/21 198 lb (89.8 kg)  01/30/21 195 lb 12.8 oz (88.8 kg)  01/13/21 195 lb 12.8 oz (88.8 kg)    Physical Exam Constitutional:      General: She is not in acute distress.    Appearance: She is not toxic-appearing.  HENT:     Head: Normocephalic and atraumatic.  Pulmonary:     Effort: No respiratory distress.  Musculoskeletal:     Left lower leg: No edema.     Comments: LLE with varicosities.  No redness or infection seen  Skin:    General: Skin is warm and dry.  Neurological:     Mental Status: She is alert and oriented to person, place, and time.  Psychiatric:        Attention and Perception: Attention normal.        Speech: Speech normal.        Behavior: Behavior is cooperative.            Assessment & Plan:   Encounter Diagnoses  Name Primary?   Cystitis Yes   Dysuria      -rx keflex for urine -pt to follow up as scheduled.  She is to contact office sooner prn

## 2021-03-26 ENCOUNTER — Other Ambulatory Visit: Payer: Self-pay | Admitting: Physician Assistant

## 2021-03-29 ENCOUNTER — Emergency Department (HOSPITAL_COMMUNITY)
Admission: EM | Admit: 2021-03-29 | Discharge: 2021-03-29 | Disposition: A | Discharge status: Home / Self Care | Payer: Self-pay | Attending: Emergency Medicine | Admitting: Emergency Medicine

## 2021-03-29 ENCOUNTER — Encounter (HOSPITAL_COMMUNITY): Payer: Self-pay | Admitting: *Deleted

## 2021-03-29 ENCOUNTER — Other Ambulatory Visit: Payer: Self-pay

## 2021-03-29 DIAGNOSIS — F17210 Nicotine dependence, cigarettes, uncomplicated: Secondary | ICD-10-CM | POA: Insufficient documentation

## 2021-03-29 DIAGNOSIS — Z794 Long term (current) use of insulin: Secondary | ICD-10-CM | POA: Insufficient documentation

## 2021-03-29 DIAGNOSIS — Z79899 Other long term (current) drug therapy: Secondary | ICD-10-CM | POA: Insufficient documentation

## 2021-03-29 DIAGNOSIS — I809 Phlebitis and thrombophlebitis of unspecified site: Secondary | ICD-10-CM

## 2021-03-29 DIAGNOSIS — I10 Essential (primary) hypertension: Secondary | ICD-10-CM | POA: Insufficient documentation

## 2021-03-29 DIAGNOSIS — Z91040 Latex allergy status: Secondary | ICD-10-CM | POA: Insufficient documentation

## 2021-03-29 DIAGNOSIS — I8002 Phlebitis and thrombophlebitis of superficial vessels of left lower extremity: Secondary | ICD-10-CM | POA: Insufficient documentation

## 2021-03-29 DIAGNOSIS — E11319 Type 2 diabetes mellitus with unspecified diabetic retinopathy without macular edema: Secondary | ICD-10-CM | POA: Insufficient documentation

## 2021-03-29 DIAGNOSIS — Z7984 Long term (current) use of oral hypoglycemic drugs: Secondary | ICD-10-CM | POA: Insufficient documentation

## 2021-03-29 DIAGNOSIS — J45909 Unspecified asthma, uncomplicated: Secondary | ICD-10-CM | POA: Insufficient documentation

## 2021-03-29 MED ORDER — NAPROXEN 500 MG PO TABS: 500.0000 mg | ORAL_TABLET | Freq: Two times a day (BID) | ORAL | 0 refills | Status: DC

## 2021-03-29 NOTE — Discharge Instructions (Addendum)
The radiology department will get in touch with you about when you should return to get your ultrasound of your leg.  We also have referred you to a vascular specialist that he can follow-up within the next couple weeks

## 2021-03-29 NOTE — ED Triage Notes (Signed)
Pt with left lower leg pain for a week.  Pt with varicose veins noted.

## 2021-03-29 NOTE — ED Provider Notes (Signed)
[facility] EMERGENCY DEPARTMENT Provider Note   CSN: [id number] Arrival date & time: 03/29/21  1442     History Chief Complaint  Patient presents with   Leg Pain    Shari Collins is a 45 y.o. female.  Patient states that she has been having pain in her left lower leg.  The history is provided by the patient and medical records. No language interpreter was used.  Leg Pain Lower extremity pain location: Left tib-fib. Time since incident:  4 days Injury: no   Pain details:    Quality:  Aching   Radiates to:  Does not radiate   Severity:  Mild   Onset quality:  Sudden   Timing:  Constant   Progression:  Worsening Chronicity:  New Dislocation: no   Associated symptoms: no back pain and no fatigue       Past Medical History:  Diagnosis Date   Asthma    Atrial fibrillation ([place])    per patient; however cardiac monitor December 2021 without atrial fibrillation. She had isolated PACs and couplets, rare episodes of SVT consistent with atrial tachycardia.  Rare ventricular ectopy with isolated PVCs and couplets.     Depression    Diabetes mellitus    Hypercholesteremia    Hypertension    Noncompliance     Patient Active Problem List   Diagnosis Date Noted   Constipation 01/13/2021   Pain of upper abdomen 01/13/2021   Dysphagia 01/13/2021   Gastroesophageal reflux disease 01/13/2021   Non-intractable vomiting 01/13/2021   Diabetic retinopathy ([place]) 09/14/2018   Hyperlipidemia 10/07/2015   Breast abscess 10/02/2015   Schizophrenia ([place]) 10/02/2015   Bipolar disorder, unspecified ([place]) 10/02/2015   Uncontrolled type 2 diabetes mellitus with complication ([place]) [id number]   Personal history of noncompliance with medical treatment, presenting hazards to health 09/24/2015    Past Surgical History:  Procedure Laterality Date   CESAREAN SECTION     ESOPHAGOGASTRODUODENOSCOPY (EGD) WITH PROPOFOL N/A 01/30/2021   Procedure: ESOPHAGOGASTRODUODENOSCOPY (EGD) WITH  PROPOFOL;  Surgeon: Daneil Dolin, MD;  Location: [facility] ENDO SUITE;  Service: Endoscopy;  Laterality: N/A;  12:30pm   MALONEY DILATION N/A 01/30/2021   Procedure: Venia Minks DILATION;  Surgeon: Daneil Dolin, MD;  Location: [facility] ENDO SUITE;  Service: Endoscopy;  Laterality: N/A;   TONSILLECTOMY       OB History     Gravida  4   Para  1   Term  1   Preterm      AB  3   Living  1      SAB  3   IAB      Ectopic      Multiple      Live Births  1           Family History  Problem Relation Age of Onset   Hypertension Paternal Grandmother    Hypertension Maternal Grandmother    Diabetes Father    Hypertension Father    Cancer Mother        liver   Heart attack Paternal Grandfather    Diabetes Brother    Colon cancer Neg Hx     Social History   Tobacco Use   Smoking status: Every Day    Packs/day: 0.50    Years: 20.00    Pack years: 10.00    Types: Cigarettes   Smokeless tobacco: Never  Vaping Use   Vaping Use: Never used  Substance Use Topics   Alcohol use: Not Currently  Comment: Weekend -- almost a 5th; last drank over 1 year ago (01/13/21)   Drug use: No    Home Medications Prior to Admission medications   Medication Sig Start Date End Date Taking? Authorizing Provider  naproxen (NAPROSYN) 500 MG tablet Take 1 tablet (500 mg total) by mouth 2 (two) times daily. 03/29/21  Yes Milton Ferguson, MD  atorvastatin (LIPITOR) 20 MG tablet TAKE 1 Tablet BY MOUTH ONCE EVERY DAY 03/24/21   Soyla Dryer, PA-C  cephALEXin (KEFLEX) 500 MG capsule Take 1 capsule (500 mg total) by mouth 4 (four) times daily. 03/25/21   Soyla Dryer, PA-C  diphenhydrAMINE (BENADRYL) 25 MG tablet Take 25 mg by mouth daily as needed for allergies.    [provider]  fenofibrate (TRICOR) 145 MG tablet Take 1 tablet (145 mg total) by mouth daily. 07/23/20   Soyla Dryer, PA-C  insulin glargine (LANTUS SOLOSTAR) 100 UNIT/ML Solostar Pen Inject 35 Units into the skin at  bedtime. 02/04/21   Soyla Dryer, PA-C  lisinopril (ZESTRIL) 10 MG tablet TAKE 1 Tablet BY MOUTH ONCE EVERY DAY 03/24/21   Soyla Dryer, PA-C  metFORMIN (GLUCOPHAGE) 1000 MG tablet TAKE 1 Tablet  BY MOUTH TWICE DAILY WITH A MEAL 03/26/21   Soyla Dryer, PA-C  methocarbamol (ROBAXIN) 500 MG tablet Take 1 tablet (500 mg total) by mouth 3 (three) times daily. Patient not taking: Reported on 03/25/2021 01/06/21   Soyla Dryer, PA-C  metoprolol tartrate (LOPRESSOR) 50 MG tablet TAKE 1 Tablet  BY MOUTH TWICE DAILY Patient taking differently: Take 25 mg by mouth 2 (two) times daily. 01/20/21   Soyla Dryer, PA-C  omeprazole (PRILOSEC) 40 MG capsule TAKE 1 Capsule BY MOUTH ONCE EVERY DAY 03/24/21   Soyla Dryer, PA-C  PROVENTIL HFA 108 (90 Base) MCG/ACT inhaler INHALE 2 PUFFS BY MOUTH EVERY 6 HOURS AS NEEDED FOR COUGHING, WHEEZING, OR SHORTNESS OF BREATH 03/26/21   Soyla Dryer, PA-C  triamcinolone cream (KENALOG) 0.1 % APPLY TO THE AFFECTED AREA TWICE DAILY AS NEEDED 03/26/21   Soyla Dryer, PA-C    Allergies    Bee venom, Buspar [buspirone], Latex, Prednisone, Reglan [metoclopramide], and Sulfa antibiotics  Review of Systems   Review of Systems  Constitutional:  Negative for appetite change and fatigue.  HENT:  Negative for congestion, ear discharge and sinus pressure.   Eyes:  Negative for discharge.  Respiratory:  Negative for cough.   Cardiovascular:  Negative for chest pain.  Gastrointestinal:  Negative for abdominal pain and diarrhea.  Genitourinary:  Negative for frequency and hematuria.  Musculoskeletal:  Negative for back pain.       Pain left lower leg  Skin:  Negative for rash.  Neurological:  Negative for seizures and headaches.  Psychiatric/Behavioral:  Negative for hallucinations.    Physical Exam Updated Vital Signs BP 116/80 (BP Location: Right Arm)   Pulse 78   Temp 98.2 F (36.8 C) (Oral)   Resp 18   Ht '5\' 6"'$  (1.676 m)   Wt 83 kg   LMP  03/23/2021   SpO2 96%   BMI 29.54 kg/m   Physical Exam Vitals and nursing note reviewed.  Constitutional:      Appearance: She is well-developed.  HENT:     Head: Normocephalic.     Nose: Nose normal.  Eyes:     General: No scleral icterus.    Conjunctiva/sclera: Conjunctivae normal.  Neck:     Thyroid: No thyromegaly.  Cardiovascular:     Rate and Rhythm: Normal rate  and regular rhythm.     Heart sounds: No murmur heard.   No friction rub. No gallop.  Pulmonary:     Breath sounds: No stridor. No wheezing or rales.  Chest:     Chest wall: No tenderness.  Abdominal:     General: There is no distension.     Tenderness: There is no abdominal tenderness. There is no rebound.  Musculoskeletal:        General: Normal range of motion.     Cervical back: Neck supple.     Comments: Patient has tenderness to left lower leg with significant varicose veins.  Suspect superficial phlebitis  Lymphadenopathy:     Cervical: No cervical adenopathy.  Skin:    Findings: No erythema or rash.  Neurological:     Mental Status: She is alert and oriented to person, place, and time.     Motor: No abnormal muscle tone.     Coordination: Coordination normal.  Psychiatric:        Behavior: Behavior normal.    ED Results / Procedures / Treatments   Labs (all labs ordered are listed, but only abnormal results are displayed) Labs Reviewed - No data to display  EKG None  Radiology No results found.  Procedures Procedures   Medications Ordered in ED Medications - No data to display  ED Course  I have reviewed the triage vital signs and the nursing notes.  Pertinent labs & imaging results that were available during my care of the patient were reviewed by me and considered in my medical decision making (see chart for details).    MDM Rules/Calculators/A&P                           Patient with most likely superficial phlebitis she is put on Naprosyn.  She will get an ultrasound to  look for a DVT and is referred to vascular surgery Final Clinical Impression(s) / ED Diagnoses Final diagnoses:  Superficial phlebitis    Rx / DC Orders ED Discharge Orders          Ordered    naproxen (NAPROSYN) 500 MG tablet  2 times daily        03/29/21 1620    US Venous Img Lower Unilateral Left        03/29/21 1620             Milton Ferguson, MD 03/29/21 1623

## 2021-04-22 ENCOUNTER — Other Ambulatory Visit: Payer: Self-pay | Admitting: Neurological Surgery

## 2021-04-23 NOTE — Progress Notes (Signed)
Surgical Instructions    Your procedure is scheduled on 05/06/21.  Report to [facility] Main Entrance "A" at 09:20 A.M., then check in with the Admitting office.  Call this number if you have problems the morning of surgery:  [phone]   If you have any questions prior to your surgery date call [phone]: Open Monday-Friday 8am-4pm    Remember:  Do not eat or drink after midnight the night before your surgery      Take these medicines the morning of surgery with A SIP OF WATER  atorvastatin (LIPITOR)  diphenhydrAMINE (BENADRYL) if needed methocarbamol (ROBAXIN)  metoprolol tartrate (LOPRESSOR)  omeprazole (PRILOSEC) PROVENTIL HFA 108 (90 Base) MCG/ACT inhaler (bring with you the day of surgery)   As of today, STOP taking any Aspirin (unless otherwise instructed by your surgeon) Aleve, Naproxen, Ibuprofen, Motrin, Advil, Goody's, BC's, all herbal medications, fish oil, and all vitamins.  WHAT DO I DO ABOUT MY DIABETES MEDICATION?   Do not take oral diabetes medicines (pills) the morning of surgery.  THE NIGHT BEFORE SURGERY, take 50% (17 units) of your insulin glargine (LANTUS SOLOSTAR).      THE MORNING OF SURGERY, do not take metFORMIN (GLUCOPHAGE).  The day of surgery, do not take other diabetes injectables, including Byetta (exenatide), Bydureon (exenatide ER), Victoza (liraglutide), or Trulicity (dulaglutide).  If your CBG is greater than 220 mg/dL, you may take  of your sliding scale (correction) dose of insulin.   HOW TO MANAGE YOUR DIABETES BEFORE AND AFTER SURGERY  Why is it important to control my blood sugar before and after surgery? Improving blood sugar levels before and after surgery helps healing and can limit problems. A way of improving blood sugar control is eating a healthy diet by:  Eating less sugar and carbohydrates  Increasing activity/exercise  Talking with your doctor about reaching your blood sugar goals High blood sugars (greater than  180 mg/dL) can raise your risk of infections and slow your recovery, so you will need to focus on controlling your diabetes during the weeks before surgery. Make sure that the doctor who takes care of your diabetes knows about your planned surgery including the date and location.  How do I manage my blood sugar before surgery? Check your blood sugar at least 4 times a day, starting 2 days before surgery, to make sure that the level is not too high or low.  Check your blood sugar the morning of your surgery when you wake up and every 2 hours until you get to the Short Stay unit.  If your blood sugar is less than 70 mg/dL, you will need to treat for low blood sugar: Do not take insulin. Treat a low blood sugar (less than 70 mg/dL) with  cup of clear juice (cranberry or apple), 4 glucose tablets, OR glucose gel. Recheck blood sugar in 15 minutes after treatment (to make sure it is greater than 70 mg/dL). If your blood sugar is not greater than 70 mg/dL on recheck, call [phone] for further instructions. Report your blood sugar to the short stay nurse when you get to Short Stay.  If you are admitted to the hospital after surgery: Your blood sugar will be checked by the staff and you will probably be given insulin after surgery (instead of oral diabetes medicines) to make sure you have good blood sugar levels. The goal for blood sugar control after surgery is 80-180 mg/dL.           Do not wear  jewelry or makeup Do not wear lotions, powders, perfumes/colognes, or deodorant. Do not shave 48 hours prior to surgery.   Do not bring valuables to the hospital.  DO Not wear nail polish, gel polish, artificial nails, or any other type of covering on natural nails  including finger and toenails. If patients have artificial nails, gel coating, etc. that need to be removed by a nail salon please have this removed prior to surgery or surgery may need to be canceled/delayed if the surgeon/ anesthesia  feels like the patient is unable to be adequately monitored.             [place] [facility] is not responsible for any belongings or valuables.  Do NOT Smoke (Tobacco/Vaping) or drink Alcohol 24 hours prior to your procedure If you use a CPAP at night, you may bring all equipment for your overnight stay.   Contacts, glasses, dentures or bridgework may not be worn into surgery, please bring cases for these belongings   For patients admitted to the hospital, discharge time will be determined by your treatment team.   Patients discharged the day of surgery will not be allowed to drive home, and someone needs to stay with them for 24 hours.  ONLY 1 SUPPORT PERSON MAY BE PRESENT WHILE YOU ARE IN SURGERY. IF YOU ARE TO BE ADMITTED ONCE YOU ARE IN YOUR ROOM YOU WILL BE ALLOWED TWO (2) VISITORS.  Minor children may have two parents present. Special consideration for safety and communication needs will be reviewed on a case by case basis.  Special instructions:    Oral Hygiene is also important to reduce your risk of infection.  Remember - BRUSH YOUR TEETH THE MORNING OF SURGERY WITH YOUR REGULAR TOOTHPASTE   [place]- Preparing For Surgery  Before surgery, you can play an important role. Because skin is not sterile, your skin needs to be as free of germs as possible. You can reduce the number of germs on your skin by washing with CHG (chlorahexidine gluconate) Soap before surgery.  CHG is an antiseptic cleaner which kills germs and bonds with the skin to continue killing germs even after washing.     Please do not use if you have an allergy to CHG or antibacterial soaps. If your skin becomes reddened/irritated stop using the CHG.  Do not shave (including legs and underarms) for at least 48 hours prior to first CHG shower. It is OK to shave your face.  Please follow these instructions carefully.     Shower the NIGHT BEFORE SURGERY and the MORNING OF SURGERY with CHG Soap.   If you chose to wash  your hair, wash your hair first as usual with your normal shampoo. After you shampoo, rinse your hair and body thoroughly to remove the shampoo.  Then [employer] and genitals (private parts) with your normal soap and rinse thoroughly to remove soap.  After that Use CHG Soap as you would any other liquid soap. You can apply CHG directly to the skin and wash gently with a scrungie or a clean washcloth.   Apply the CHG Soap to your body ONLY FROM THE NECK DOWN.  Do not use on open wounds or open sores. Avoid contact with your eyes, ears, mouth and genitals (private parts). Wash Face and genitals (private parts)  with your normal soap.   Wash thoroughly, paying special attention to the area where your surgery will be performed.  Thoroughly rinse your body with warm water from the neck  down.  DO NOT shower/wash with your normal soap after using and rinsing off the CHG Soap.  Pat yourself dry with a CLEAN TOWEL.  Wear CLEAN PAJAMAS to bed the night before surgery  Place CLEAN SHEETS on your bed the night before your surgery  DO NOT SLEEP WITH PETS.   Day of Surgery: Take a shower with CHG soap. Wear Clean/Comfortable clothing the morning of surgery Do not apply any deodorants/lotions.   Remember to brush your teeth WITH YOUR REGULAR TOOTHPASTE.   Please read over the following fact sheets that you were given.

## 2021-04-24 ENCOUNTER — Encounter (HOSPITAL_COMMUNITY): Payer: Self-pay

## 2021-04-24 ENCOUNTER — Encounter (HOSPITAL_COMMUNITY)
Admission: RE | Admit: 2021-04-24 | Discharge: 2021-04-24 | Disposition: A | Discharge status: Home / Self Care | Payer: No Typology Code available for payment source | Source: Ambulatory Visit | Attending: Neurological Surgery | Admitting: Neurological Surgery

## 2021-04-24 ENCOUNTER — Other Ambulatory Visit: Payer: Self-pay

## 2021-04-24 DIAGNOSIS — Z01818 Encounter for other preprocedural examination: Secondary | ICD-10-CM | POA: Insufficient documentation

## 2021-04-24 HISTORY — DX: Anxiety disorder, unspecified: F41.9

## 2021-04-24 HISTORY — DX: Bipolar disorder, unspecified: F31.9

## 2021-04-24 HISTORY — DX: Schizophrenia, unspecified: F20.9

## 2021-04-24 HISTORY — DX: Cardiac arrhythmia, unspecified: I49.9

## 2021-04-24 LAB — SURGICAL PCR SCREEN
MRSA, PCR: NEGATIVE
Staphylococcus aureus: NEGATIVE

## 2021-04-24 LAB — BASIC METABOLIC PANEL
Anion gap: 7 (ref 5–15)
BUN: 10 mg/dL (ref 6–20)
CO2: 25 mmol/L (ref 22–32)
Calcium: 8.9 mg/dL (ref 8.9–10.3)
Chloride: 104 mmol/L (ref 98–111)
Creatinine, Ser: 0.62 mg/dL (ref 0.44–1.00)
GFR, Estimated: >60 mL/min (ref >60)
Glucose, Bld: 101 mg/dL — ABNORMAL HIGH (ref 70–99)
Potassium: 4.1 mmol/L (ref 3.5–5.1)
Sodium: 136 mmol/L (ref 135–145)

## 2021-04-24 LAB — CBC
HCT: 39.7 % (ref 36.0–46.0)
Hemoglobin: 12.9 g/dL (ref 12.0–15.0)
MCH: 29.1 pg (ref 26.0–34.0)
MCHC: 32.5 g/dL (ref 30.0–36.0)
MCV: 89.6 fL (ref 80.0–100.0)
Platelets: 354 10*3/uL (ref 150–400)
RBC: 4.43 MIL/uL (ref 3.87–5.11)
RDW: 15.4 % (ref 11.5–15.5)
WBC: 11.2 10*3/uL — ABNORMAL HIGH (ref 4.0–10.5)
nRBC: 0 % (ref 0.0–0.2)

## 2021-04-24 LAB — TYPE AND SCREEN
ABO/RH(D): A POS
Antibody Screen: NEGATIVE

## 2021-04-24 LAB — GLUCOSE, CAPILLARY: Glucose-Capillary: 106 mg/dL — ABNORMAL HIGH (ref 70–99)

## 2021-04-24 LAB — HEMOGLOBIN A1C
Hgb A1c MFr Bld: 7.1 % — ABNORMAL HIGH (ref 4.8–5.6)
Mean Plasma Glucose: 157.07 mg/dL

## 2021-04-24 NOTE — Progress Notes (Signed)
PCP - pt states she was seeing Soyla Dryer, PA-C until today (8/18) because she is moving Cardiologist - denies  PPM/ICD - denies   Chest x-ray - 08/20/16 EKG - 04/24/21 Stress Test - denies ECHO - 07/17/14 Cardiac Cath - denies  Sleep study- denies  DM: Type 2 Fasting Blood Sugar - 150 Checks Blood Sugar 4-5 times a day   ERAS Protcol - No  COVID TEST- pt to be tested on day of surgery (8/30) due to transportation issues   Anesthesia review: yes, abnormal EKG  Patient denies shortness of breath, fever, cough and chest pain at PAT appointment   All instructions explained to the patient, with a verbal understanding of the material. Patient agrees to go over the instructions while at home for a better understanding.  The opportunity to ask questions was provided.

## 2021-04-25 ENCOUNTER — Encounter (HOSPITAL_COMMUNITY): Payer: Self-pay

## 2021-04-25 NOTE — Progress Notes (Addendum)
Anesthesia Chart Review:  Case: [id number] Date/Time: 05/06/21 1104   Procedure: ACDF C45, C56 - 3C   Anesthesia type: General   Pre-op diagnosis: CERVICAL SPONDYLOSIS WITH RADICULOPATHY   Location: [facility] OR ROOM 66 / [facility]   Surgeons: Dawley, Theodoro Doing, DO       DISCUSSION: Patient is a 45 year old female scheduled for the above procedure.  History includes smoking, HTN, dysrhythmia, hypercholesterolemia, DM2, asthma, bipolar disorder. EGD with [place] dilatation 01/30/21.  - ED visit 03/29/21 for tenderness to LLE with significant varicose veins, superficial phlebitis suspected. She was put on Naprosyn and referred to vascular surgery. LLE venous US was ordered but has not been done yet.   She had cardiology evaluation on 08/09/2020 by Dr. Harl Bowie for palpitations.  Long-term event monitor showed rare episodes of SVT lasting up to 12 seconds with episodes consistent with atrial tachycardia, rare PVCs.  Overall he felt findings were benign, but could try medication for symptom management if patient desired.  Metoprolol prescribed in February.  He also referred her to vascular surgery for left lower extremity large varicose veins.  Reviewed with anesthesiologist Annye Asa, MD. Patient with [facility] ED visit < 1 month ago, likely for LLE superficial phlebitis, but LLE venous US ordered to rule out DVT. With upcoming surgery recommend that she follow though with test to rule out DVT. Patient lives in [place], so will need to see if it can be completed at [facility] or close facility.    She is for day of surgery COVID-19 testing due to transportation issues.  Will leave chart for follow-up regarding arranging LLE venous US and making sure patient can get transportation to have test completed.   ADDENDUM 04/28/21 10:48 AM: I called and spoke with Ms. Decesare. She reported that she recently moved to [place], [place] which is several hours from [place]. She has not gotten established with a primary care there  yet.  Advised per my discussion with anesthesiologist  that she should follow through with LLE venous US prior to surgery to ensure no DVT. She did say her LLE swelling had improved. Unfortunately, since she is not planning to come back to [place] prior to surgery date, she would have to get the test done near [place]. I advised she could visit an urgent care to see if this could be ordered and arranged this week--[facility] has locations near [facility] and are in [place]. I asked her to call me once she has had the test. Of note, she reported that her workman's comp lawyer is also investigating if she can be admitted prior to surgery given her distance from [facility], but she has not heard if this will be worked out or not. I left a message for [facility] at Dr. Rubbie Battiest office indicated that Ms. Ridling said she would work on getting LLE venous US completed prior to surgery.   ADDENDUM 05/02/21 6:30 PM: Patient was not able to get the LLE venous Duplex via urgent care or ED without being seen. With her permission, I communicated with her workman's comp representatives who indicated that they could arrange the [place] if the order was faxed to them. I communicated with Dr. Roderic Palau who originally ordered th LE venous US, and we were able to get the appropriate order sent. She had the test on 05/02/21 at [facility] in [place], [place], and there was no evidence of DVT within the LLE. I called and notified patient and sent communication to Dr.  Rachel Moulds at Dr. Rubbie Battiest office. Anesthesia team to evaluate on the day of surgery.  She is for urine pregnancy test and COVID-19 testing on arrival as well.    VS: BP 128/76   Pulse 63   Temp 36.7 C (Oral)   Resp 20   Ht '5\' 6"'$  (1.676 m)   Wt 87.7 kg   LMP 04/13/2021 (Exact Date)   SpO2 100%   BMI 31.21 kg/m   PROVIDERS: PCP is with [facility]. Last visit with Soyla Dryer, PA-C, last visit 03/25/21; however, patient  stated that [place] is relocating.  - Carlyle Dolly, MD is cardiologist   LABS: Labs reviewed: Acceptable for surgery. (all labs ordered are listed, but only abnormal results are displayed)  Labs Reviewed  GLUCOSE, CAPILLARY - Abnormal; Notable for the following components:      Result Value   Glucose-Capillary 106 (*)    All other components within normal limits  HEMOGLOBIN A1C - Abnormal; Notable for the following components:   Hgb A1c MFr Bld 7.1 (*)    All other components within normal limits  CBC - Abnormal; Notable for the following components:   WBC 11.2 (*)    All other components within normal limits  BASIC METABOLIC PANEL - Abnormal; Notable for the following components:   Glucose, Bld 101 (*)    All other components within normal limits  SURGICAL PCR SCREEN  TYPE AND SCREEN     IMAGES: CT C-spine 12/27/20: IMPRESSION: 1. 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 Posterior disc osteophyte complex causes mild right neural foraminal stenosis at C4-C5. No high-grade canal stenosis. 2. Straightening of normal lordosis may be due to positioning or muscle spasm, and is similar to prior cervical radiographs.   EKG: 04/24/21: Sinus bradycardia at 54 bpm Nonspecific ST and T wave abnormality Abnormal ECG Confirmed by Ida Rogue [phone]) on 04/24/2021 6:10:37 PM - Overall EKG appears similar to 01/16/20 tracing.   CV: LLE Venous US [facility]): Impression: No evidence of deep venous thrombosis within the left lower extremity.  Long-term cardiac event monitor 08/12/20-08/18/20: Study Highlights 5 days 19 hours monitor Min HR 49, Max HR 150, Avg HR 68 There were patient triggered events but no symptoms reported Rare supraventricular ectopy in the form of isolated PACs and couplets.Rare episodes of SVT lasting up to 12 seconds. SVT episodes consistent with atrial tachycardia Rare ventricular ectopy in the form of isolated PVCs and  couplets   Past Medical History:  Diagnosis Date   Anxiety    pt states she was diagnosed years ago   Asthma    Atrial fibrillation [facility])    per patient; however cardiac monitor December 2021 without atrial fibrillation. She had isolated PACs and couplets, rare episodes of SVT consistent with atrial tachycardia.  Rare ventricular ectopy with isolated PVCs and couplets.     Bipolar disorder ([place])    pt states she was diagnosed years ago   Depression    Diabetes mellitus    Dysrhythmia    Hypercholesteremia    Hypertension    Noncompliance    Schizophrenia ([place])    pt states she was diagnosed years ago    Past Surgical History:  Procedure Laterality Date   [place] and 2007   ESOPHAGOGASTRODUODENOSCOPY (EGD) WITH PROPOFOL N/A 01/30/2021   Procedure: ESOPHAGOGASTRODUODENOSCOPY (EGD) WITH PROPOFOL;  Surgeon: Daneil Dolin, MD;  Location: [facility];  Service: Endoscopy;  Laterality: N/A;  12:30pm   MALONEY DILATION N/A 01/30/2021   Procedure: Venia Minks DILATION;  Surgeon: Daneil Dolin, MD;  Location: [facility];  Service: Endoscopy;  Laterality: N/A;   TONSILLECTOMY      MEDICATIONS:  atorvastatin (LIPITOR) 20 MG tablet   cephALEXin (KEFLEX) 500 MG capsule   diphenhydrAMINE (BENADRYL) 25 MG tablet   fenofibrate (TRICOR) 145 MG tablet   insulin glargine (LANTUS SOLOSTAR) 100 UNIT/ML Solostar Pen   lisinopril (ZESTRIL) 10 MG tablet   metFORMIN (GLUCOPHAGE) 1000 MG tablet   methocarbamol (ROBAXIN) 500 MG tablet   metoprolol tartrate (LOPRESSOR) 25 MG tablet   metoprolol tartrate (LOPRESSOR) 50 MG tablet   naproxen (NAPROSYN) 500 MG tablet   omeprazole (PRILOSEC) 40 MG capsule   PROVENTIL HFA 108 (90 Base) MCG/ACT inhaler   triamcinolone cream (KENALOG) 0.1 %   No current facility-administered medications for this encounter.    Myra Gianotti, PA-C Surgical Short Stay/Anesthesiology [facility] Phone [phone] [facility] Phone [phone] 04/25/2021 6:25 PM

## 2021-05-02 NOTE — Anesthesia Preprocedure Evaluation (Addendum)
Anesthesia Evaluation  Patient identified by MRN, date of birth, ID band Patient awake    Reviewed: Allergy & Precautions, NPO status , Patient's Chart, lab work & pertinent test results  Airway Mallampati: II  TM Distance: >3 FB Neck ROM: Full    Dental no notable dental hx. (+) Teeth Intact, Dental Advisory Given   Pulmonary asthma , Current SmokerPatient did not abstain from smoking.,    Pulmonary exam normal breath sounds clear to auscultation       Cardiovascular Exercise Tolerance: Good hypertension, Normal cardiovascular exam+ dysrhythmias Supra Ventricular Tachycardia  Rhythm:Regular Rate:Normal     Neuro/Psych PSYCHIATRIC DISORDERS Bipolar Disorder    GI/Hepatic GERD  ,  Endo/Other  diabetes, Type 2  Renal/GU      Musculoskeletal negative musculoskeletal ROS (+)   Abdominal (+) + obese (BMI 31.15),   Peds  Hematology negative hematology ROS (+) Lab Results      Component                Value               Date                      WBC                      11.2 (H)            04/24/2021                HGB                      12.9                04/24/2021                HCT                      39.7                04/24/2021                MCV                      89.6                04/24/2021                PLT                      354                 04/24/2021              Anesthesia Other Findings   Reproductive/Obstetrics negative OB ROS                           Anesthesia Physical Anesthesia Plan  ASA: 3  Anesthesia Plan: General   Post-op Pain Management:    Induction: Intravenous  PONV Risk Score and Plan: 3 and Treatment may vary due to age or medical condition, Midazolam, Ondansetron and Dexamethasone  Airway Management Planned: Video Laryngoscope Planned and Oral ETT  Additional Equipment: None  Intra-op Plan:   Post-operative Plan: Extubation in  OR  Informed Consent: I have reviewed the patients History and Physical, chart, labs and discussed the procedure including the  risks, benefits and alternatives for the proposed anesthesia with the patient or authorized representative who has indicated his/her understanding and acceptance.     Dental advisory given  Plan Discussed with: CRNA and Anesthesiologist  Anesthesia Plan Comments: (PAT note written by Myra Gianotti, PA-C. 05/02/21 LLE venous US was negative for DVT. )      Anesthesia Quick Evaluation

## 2021-05-05 NOTE — Progress Notes (Signed)
Spoke with patient at [place] She voiced understanding of new arrival time of 1000 tomorrow.

## 2021-05-06 ENCOUNTER — Ambulatory Visit (HOSPITAL_COMMUNITY): Payer: No Typology Code available for payment source | Admitting: Vascular Surgery

## 2021-05-06 ENCOUNTER — Other Ambulatory Visit: Payer: Self-pay

## 2021-05-06 ENCOUNTER — Ambulatory Visit (HOSPITAL_COMMUNITY): Payer: No Typology Code available for payment source

## 2021-05-06 ENCOUNTER — Encounter (HOSPITAL_COMMUNITY): Payer: Self-pay

## 2021-05-06 ENCOUNTER — Encounter (HOSPITAL_COMMUNITY)
Admission: RE | Disposition: A | Discharge status: Home / Self Care | Payer: Self-pay | Source: Home / Self Care | Attending: Neurological Surgery

## 2021-05-06 ENCOUNTER — Encounter (HOSPITAL_COMMUNITY): Payer: Self-pay | Admitting: Neurological Surgery

## 2021-05-06 ENCOUNTER — Observation Stay (HOSPITAL_COMMUNITY)
Admission: RE | Admit: 2021-05-06 | Discharge: 2021-05-07 | Disposition: A | Discharge status: Home / Self Care | Payer: No Typology Code available for payment source | Attending: Neurological Surgery | Admitting: Neurological Surgery

## 2021-05-06 DIAGNOSIS — Z91040 Latex allergy status: Secondary | ICD-10-CM | POA: Insufficient documentation

## 2021-05-06 DIAGNOSIS — I10 Essential (primary) hypertension: Secondary | ICD-10-CM | POA: Diagnosis not present

## 2021-05-06 DIAGNOSIS — Z794 Long term (current) use of insulin: Secondary | ICD-10-CM | POA: Diagnosis not present

## 2021-05-06 DIAGNOSIS — Z79899 Other long term (current) drug therapy: Secondary | ICD-10-CM | POA: Diagnosis not present

## 2021-05-06 DIAGNOSIS — Z7984 Long term (current) use of oral hypoglycemic drugs: Secondary | ICD-10-CM | POA: Insufficient documentation

## 2021-05-06 DIAGNOSIS — F17210 Nicotine dependence, cigarettes, uncomplicated: Secondary | ICD-10-CM | POA: Diagnosis not present

## 2021-05-06 DIAGNOSIS — E119 Type 2 diabetes mellitus without complications: Secondary | ICD-10-CM | POA: Diagnosis not present

## 2021-05-06 DIAGNOSIS — Z419 Encounter for procedure for purposes other than remedying health state, unspecified: Secondary | ICD-10-CM

## 2021-05-06 DIAGNOSIS — M5412 Radiculopathy, cervical region: Secondary | ICD-10-CM | POA: Diagnosis present

## 2021-05-06 DIAGNOSIS — M4722 Other spondylosis with radiculopathy, cervical region: Secondary | ICD-10-CM | POA: Diagnosis not present

## 2021-05-06 DIAGNOSIS — M4802 Spinal stenosis, cervical region: Secondary | ICD-10-CM | POA: Diagnosis not present

## 2021-05-06 DIAGNOSIS — J45909 Unspecified asthma, uncomplicated: Secondary | ICD-10-CM | POA: Insufficient documentation

## 2021-05-06 DIAGNOSIS — Z20822 Contact with and (suspected) exposure to covid-19: Secondary | ICD-10-CM | POA: Insufficient documentation

## 2021-05-06 HISTORY — PX: ANTERIOR CERVICAL DECOMP/DISCECTOMY FUSION: SHX1161

## 2021-05-06 LAB — ABO/RH: ABO/RH(D): A POS

## 2021-05-06 LAB — SARS CORONAVIRUS 2 BY RT PCR (HOSPITAL ORDER, PERFORMED IN ~~LOC~~ HOSPITAL LAB): SARS Coronavirus 2: NEGATIVE

## 2021-05-06 LAB — GLUCOSE, CAPILLARY
Glucose-Capillary: 196 mg/dL — ABNORMAL HIGH (ref 70–99)
Glucose-Capillary: 149 mg/dL — ABNORMAL HIGH (ref 70–99)
Glucose-Capillary: 179 mg/dL — ABNORMAL HIGH (ref 70–99)
Glucose-Capillary: 293 mg/dL — ABNORMAL HIGH (ref 70–99)

## 2021-05-06 LAB — POCT PREGNANCY, URINE: Preg Test, Ur: NEGATIVE

## 2021-05-06 LAB — TROPONIN I (HIGH SENSITIVITY): Troponin I (High Sensitivity): 13 ng/L (ref <18)

## 2021-05-06 SURGERY — ANTERIOR CERVICAL DECOMPRESSION/DISCECTOMY FUSION 2 LEVELS
Anesthesia: General | Site: Spine Cervical

## 2021-05-06 MED ORDER — ROCURONIUM BROMIDE 10 MG/ML (PF) SYRINGE
PREFILLED_SYRINGE | INTRAVENOUS | Status: DC | PRN
  Administered 2021-05-06: 50 mg via INTRAVENOUS

## 2021-05-06 MED ORDER — HYDROMORPHONE HCL 1 MG/ML IJ SOLN: 0.5000 mg | INTRAMUSCULAR | Status: DC | PRN

## 2021-05-06 MED ORDER — FENTANYL CITRATE (PF) 250 MCG/5ML IJ SOLN
INTRAMUSCULAR | Status: AC
  Filled 2021-05-06: qty 5

## 2021-05-06 MED ORDER — PHENYLEPHRINE 40 MCG/ML (10ML) SYRINGE FOR IV PUSH (FOR BLOOD PRESSURE SUPPORT)
PREFILLED_SYRINGE | INTRAVENOUS | Status: AC
  Filled 2021-05-06: qty 10

## 2021-05-06 MED ORDER — ACETAMINOPHEN 325 MG PO TABS: 650.0000 mg | ORAL_TABLET | ORAL | Status: DC | PRN

## 2021-05-06 MED ORDER — CHLORHEXIDINE GLUCONATE CLOTH 2 % EX PADS: 6.0000 | MEDICATED_PAD | Freq: Once | CUTANEOUS | Status: DC

## 2021-05-06 MED ORDER — CHLORHEXIDINE GLUCONATE CLOTH 2 % EX PADS
6.0000 | MEDICATED_PAD | Freq: Once | CUTANEOUS | Status: DC
Start: 2021-05-06 — End: 2021-05-06

## 2021-05-06 MED ORDER — HEMOSTATIC AGENTS (NO CHARGE) OPTIME
TOPICAL | Status: DC | PRN
  Administered 2021-05-06: 1

## 2021-05-06 MED ORDER — ALUM & MAG HYDROXIDE-SIMETH 200-200-20 MG/5ML PO SUSP: 30.0000 mL | Freq: Four times a day (QID) | ORAL | Status: DC | PRN

## 2021-05-06 MED ORDER — TRIAMCINOLONE ACETONIDE 40 MG/ML IJ SUSP
INTRAMUSCULAR | Status: DC | PRN
  Administered 2021-05-06: 40 mg

## 2021-05-06 MED ORDER — DEXMEDETOMIDINE (PRECEDEX) IN NS 20 MCG/5ML (4 MCG/ML) IV SYRINGE
PREFILLED_SYRINGE | INTRAVENOUS | Status: AC
  Filled 2021-05-06: qty 5

## 2021-05-06 MED ORDER — LACTATED RINGERS IV SOLN: INTRAVENOUS | Status: DC | PRN

## 2021-05-06 MED ORDER — DEXMEDETOMIDINE (PRECEDEX) IN NS 20 MCG/5ML (4 MCG/ML) IV SYRINGE
PREFILLED_SYRINGE | INTRAVENOUS | Status: DC | PRN
  Administered 2021-05-06: 12 ug via INTRAVENOUS
  Administered 2021-05-06: 8 ug via INTRAVENOUS

## 2021-05-06 MED ORDER — MIDAZOLAM HCL 2 MG/2ML IJ SOLN
INTRAMUSCULAR | Status: DC | PRN
  Administered 2021-05-06: 2 mg via INTRAVENOUS

## 2021-05-06 MED ORDER — SENNOSIDES-DOCUSATE SODIUM 8.6-50 MG PO TABS: 1.0000 | ORAL_TABLET | Freq: Every evening | ORAL | Status: DC | PRN

## 2021-05-06 MED ORDER — PROPOFOL 10 MG/ML IV BOLUS
INTRAVENOUS | Status: DC | PRN
  Administered 2021-05-06: 100 mg via INTRAVENOUS

## 2021-05-06 MED ORDER — OXYCODONE HCL 5 MG PO TABS: 5.0000 mg | ORAL_TABLET | Freq: Once | ORAL | Status: DC | PRN

## 2021-05-06 MED ORDER — 0.9 % SODIUM CHLORIDE (POUR BTL) OPTIME
TOPICAL | Status: DC | PRN
  Administered 2021-05-06: 1000 mL

## 2021-05-06 MED ORDER — ONDANSETRON HCL 4 MG/2ML IJ SOLN
INTRAMUSCULAR | Status: AC
  Filled 2021-05-06: qty 2

## 2021-05-06 MED ORDER — SUGAMMADEX SODIUM 200 MG/2ML IV SOLN
INTRAVENOUS | Status: DC | PRN
  Administered 2021-05-06: 200 mg via INTRAVENOUS

## 2021-05-06 MED ORDER — THROMBIN 5000 UNITS EX SOLR
CUTANEOUS | Status: AC
  Filled 2021-05-06: qty 15000

## 2021-05-06 MED ORDER — LACTATED RINGERS IV SOLN: INTRAVENOUS | Status: DC

## 2021-05-06 MED ORDER — KETOROLAC TROMETHAMINE 15 MG/ML IJ SOLN
15.0000 mg | Freq: Four times a day (QID) | INTRAMUSCULAR | Status: DC
  Administered 2021-05-06 – 2021-05-07 (×3): 15 mg via INTRAVENOUS
  Filled 2021-05-06 (×3): qty 1

## 2021-05-06 MED ORDER — ACETAMINOPHEN 650 MG RE SUPP: 650.0000 mg | RECTAL | Status: DC | PRN

## 2021-05-06 MED ORDER — FAMOTIDINE IN NACL 20-0.9 MG/50ML-% IV SOLN: 20.0000 mg | Freq: Once | INTRAVENOUS | Status: DC

## 2021-05-06 MED ORDER — HYDROCODONE-ACETAMINOPHEN 7.5-325 MG PO TABS: 1.0000 | ORAL_TABLET | ORAL | Status: DC | PRN

## 2021-05-06 MED ORDER — PHENYLEPHRINE HCL-NACL 20-0.9 MG/250ML-% IV SOLN
INTRAVENOUS | Status: DC | PRN
  Administered 2021-05-06: 25 ug/min via INTRAVENOUS

## 2021-05-06 MED ORDER — DEXAMETHASONE SODIUM PHOSPHATE 10 MG/ML IJ SOLN
INTRAMUSCULAR | Status: DC | PRN
  Administered 2021-05-06: 10 mg via INTRAVENOUS

## 2021-05-06 MED ORDER — TRIAMCINOLONE ACETONIDE 40 MG/ML IJ SUSP
INTRAMUSCULAR | Status: AC
  Filled 2021-05-06: qty 5

## 2021-05-06 MED ORDER — CEFAZOLIN SODIUM-DEXTROSE 2-4 GM/100ML-% IV SOLN
2.0000 g | INTRAVENOUS | Status: AC
  Administered 2021-05-06: 2 g via INTRAVENOUS
  Filled 2021-05-06: qty 100

## 2021-05-06 MED ORDER — PROPOFOL 10 MG/ML IV BOLUS
INTRAVENOUS | Status: AC
  Filled 2021-05-06: qty 20

## 2021-05-06 MED ORDER — FENTANYL CITRATE (PF) 250 MCG/5ML IJ SOLN
INTRAMUSCULAR | Status: DC | PRN
  Administered 2021-05-06 (×5): 50 ug via INTRAVENOUS

## 2021-05-06 MED ORDER — OXYCODONE HCL 5 MG PO TABS
10.0000 mg | ORAL_TABLET | ORAL | Status: DC | PRN
  Administered 2021-05-06 – 2021-05-07 (×3): 10 mg via ORAL
  Filled 2021-05-06 (×3): qty 2

## 2021-05-06 MED ORDER — ACETAMINOPHEN 10 MG/ML IV SOLN: 1000.0000 mg | Freq: Once | INTRAVENOUS | Status: DC | PRN

## 2021-05-06 MED ORDER — MIDAZOLAM HCL 2 MG/2ML IJ SOLN
INTRAMUSCULAR | Status: AC
  Filled 2021-05-06: qty 2

## 2021-05-06 MED ORDER — HYDROMORPHONE HCL 1 MG/ML IJ SOLN
0.2500 mg | INTRAMUSCULAR | Status: DC | PRN
  Administered 2021-05-06 (×2): 0.5 mg via INTRAVENOUS

## 2021-05-06 MED ORDER — OXYCODONE HCL 5 MG/5ML PO SOLN: 5.0000 mg | Freq: Once | ORAL | Status: DC | PRN

## 2021-05-06 MED ORDER — ONDANSETRON HCL 4 MG/2ML IJ SOLN: 4.0000 mg | Freq: Four times a day (QID) | INTRAMUSCULAR | Status: DC | PRN

## 2021-05-06 MED ORDER — AMISULPRIDE (ANTIEMETIC) 5 MG/2ML IV SOLN: 10.0000 mg | Freq: Once | INTRAVENOUS | Status: DC | PRN

## 2021-05-06 MED ORDER — SODIUM CHLORIDE 0.9% FLUSH: 3.0000 mL | Freq: Two times a day (BID) | INTRAVENOUS | Status: DC

## 2021-05-06 MED ORDER — ONDANSETRON HCL 4 MG/2ML IJ SOLN: 4.0000 mg | Freq: Once | INTRAMUSCULAR | Status: DC | PRN

## 2021-05-06 MED ORDER — CEFAZOLIN SODIUM-DEXTROSE 1-4 GM/50ML-% IV SOLN
1.0000 g | Freq: Three times a day (TID) | INTRAVENOUS | Status: AC
  Administered 2021-05-06 – 2021-05-07 (×2): 1 g via INTRAVENOUS
  Filled 2021-05-06 (×2): qty 50

## 2021-05-06 MED ORDER — THROMBIN 5000 UNITS EX SOLR
OROMUCOSAL | Status: DC | PRN
  Administered 2021-05-06: 5 mL via TOPICAL

## 2021-05-06 MED ORDER — SODIUM CHLORIDE 0.9% FLUSH: 3.0000 mL | INTRAVENOUS | Status: DC | PRN

## 2021-05-06 MED ORDER — HYDROMORPHONE HCL 1 MG/ML IJ SOLN
INTRAMUSCULAR | Status: AC
  Filled 2021-05-06: qty 1

## 2021-05-06 MED ORDER — LIDOCAINE 2% (20 MG/ML) 5 ML SYRINGE
INTRAMUSCULAR | Status: DC | PRN
  Administered 2021-05-06: 100 mg via INTRAVENOUS

## 2021-05-06 MED ORDER — ORAL CARE MOUTH RINSE: 15.0000 mL | Freq: Once | OROMUCOSAL | Status: AC

## 2021-05-06 MED ORDER — FAMOTIDINE 20 MG IN NS 100 ML IVPB
20.0000 mg | Freq: Once | INTRAVENOUS | Status: AC
  Administered 2021-05-06: 20 mg via INTRAVENOUS
  Filled 2021-05-06: qty 100

## 2021-05-06 MED ORDER — PHENOL 1.4 % MT LIQD: 1.0000 | OROMUCOSAL | Status: DC | PRN

## 2021-05-06 MED ORDER — SODIUM CHLORIDE 0.9 % IV SOLN: 250.0000 mL | INTRAVENOUS | Status: DC

## 2021-05-06 MED ORDER — ONDANSETRON HCL 4 MG/2ML IJ SOLN
INTRAMUSCULAR | Status: DC | PRN
  Administered 2021-05-06: 4 mg via INTRAVENOUS

## 2021-05-06 MED ORDER — MENTHOL 3 MG MT LOZG: 1.0000 | LOZENGE | OROMUCOSAL | Status: DC | PRN

## 2021-05-06 MED ORDER — ONDANSETRON HCL 4 MG PO TABS: 4.0000 mg | ORAL_TABLET | Freq: Four times a day (QID) | ORAL | Status: DC | PRN

## 2021-05-06 MED ORDER — CHLORHEXIDINE GLUCONATE 0.12 % MT SOLN
15.0000 mL | Freq: Once | OROMUCOSAL | Status: AC
  Administered 2021-05-06: 15 mL via OROMUCOSAL
  Filled 2021-05-06: qty 15

## 2021-05-06 SURGICAL SUPPLY — 72 items
ADH SKN CLS APL DERMABOND .7 (GAUZE/BANDAGES/DRESSINGS) ×1
APL SKNCLS STERI-STRIP NONHPOA (GAUZE/BANDAGES/DRESSINGS)
BAG COUNTER SPONGE SURGICOUNT (BAG) ×2 IMPLANT
BAG SPNG CNTER NS LX DISP (BAG) ×1
BAND INSRT 18 STRL LF DISP RB (MISCELLANEOUS) ×2
BAND RUBBER #18 3X1/16 STRL (MISCELLANEOUS) ×4 IMPLANT
BENZOIN TINCTURE PRP APPL 2/3 (GAUZE/BANDAGES/DRESSINGS) IMPLANT
BIT DRILL NEURO 2X3.1 SFT TUCH (MISCELLANEOUS) ×1 IMPLANT
BLADE CLIPPER SURG (BLADE) IMPLANT
BUR CARBIDE MATCH 3.0 (BURR) ×2 IMPLANT
BUR MATCHSTICK NEURO 3.0 LAGG (BURR) ×1 IMPLANT
CANISTER SUCT 3000ML PPV (MISCELLANEOUS) ×2 IMPLANT
CARTRIDGE OIL MAESTRO DRILL (MISCELLANEOUS) ×1 IMPLANT
COVER MAYO STAND STRL (DRAPES) ×4 IMPLANT
DERMABOND ADVANCED (GAUZE/BANDAGES/DRESSINGS) ×1
DERMABOND ADVANCED .7 DNX12 (GAUZE/BANDAGES/DRESSINGS) IMPLANT
DEVICE ENDSKLTN IMPL 16X14X7X6 (Cage) IMPLANT
DEVICE ENDSKLTN TC NANOLCK 6MM (Cage) ×1 IMPLANT
DIFFUSER DRILL AIR PNEUMATIC (MISCELLANEOUS) ×1 IMPLANT
DRAPE C-ARM 42X72 X-RAY (DRAPES) ×2 IMPLANT
DRAPE HALF SHEET 40X57 (DRAPES) ×1 IMPLANT
DRAPE LAPAROTOMY 100X72X124 (DRAPES) ×2 IMPLANT
DRAPE MICROSCOPE LEICA (MISCELLANEOUS) ×2 IMPLANT
DRILL NEURO 2X3.1 SOFT TOUCH (MISCELLANEOUS) ×2
DRSG OPSITE POSTOP 4X6 (GAUZE/BANDAGES/DRESSINGS) ×1 IMPLANT
DURAPREP 6ML APPLICATOR 50/CS (WOUND CARE) ×2 IMPLANT
ELECT COATED BLADE 2.86 ST (ELECTRODE) ×2 IMPLANT
ELECT REM PT RETURN 9FT ADLT (ELECTROSURGICAL) ×2
ELECTRODE REM PT RTRN 9FT ADLT (ELECTROSURGICAL) ×1 IMPLANT
ENDOSKELETON IMPLANT 16X14X7X6 (Cage) ×2 IMPLANT
ENDOSKELETON TC NANOLOCK 6MM (Cage) ×2 IMPLANT
EVACUATOR 1/8 PVC DRAIN (DRAIN) IMPLANT
GAUZE 4X4 16PLY ~~LOC~~+RFID DBL (SPONGE) ×2 IMPLANT
GLOVE EXAM NITRILE LRG STRL (GLOVE) IMPLANT
GLOVE EXAM NITRILE XL STR (GLOVE) IMPLANT
GLOVE EXAM NITRILE XS STR PU (GLOVE) IMPLANT
GLOVE SRG 8 PF TXTR STRL LF DI (GLOVE) ×5 IMPLANT
GLOVE SURG LTX SZ8 (GLOVE) ×2 IMPLANT
GLOVE SURG UNDER POLY LF SZ6.5 (GLOVE) ×3 IMPLANT
GLOVE SURG UNDER POLY LF SZ7 (GLOVE) ×2 IMPLANT
GLOVE SURG UNDER POLY LF SZ8 (GLOVE) ×10
GOWN STRL REUS W/ TWL LRG LVL3 (GOWN DISPOSABLE) IMPLANT
GOWN STRL REUS W/ TWL XL LVL3 (GOWN DISPOSABLE) ×1 IMPLANT
GOWN STRL REUS W/TWL 2XL LVL3 (GOWN DISPOSABLE) ×2 IMPLANT
GOWN STRL REUS W/TWL LRG LVL3 (GOWN DISPOSABLE) ×2
GOWN STRL REUS W/TWL XL LVL3 (GOWN DISPOSABLE) ×2
HEMOSTAT POWDER KIT SURGIFOAM (HEMOSTASIS) ×2 IMPLANT
KIT BASIN OR (CUSTOM PROCEDURE TRAY) ×2 IMPLANT
KIT TURNOVER KIT B (KITS) ×2 IMPLANT
NDL SPNL 18GX3.5 QUINCKE PK (NEEDLE) ×1 IMPLANT
NEEDLE HYPO 22GX1.5 SAFETY (NEEDLE) ×2 IMPLANT
NEEDLE SPNL 18GX3.5 QUINCKE PK (NEEDLE) ×2 IMPLANT
NS IRRIG 1000ML POUR BTL (IV SOLUTION) ×2 IMPLANT
OIL CARTRIDGE MAESTRO DRILL (MISCELLANEOUS)
PACK LAMINECTOMY NEURO (CUSTOM PROCEDURE TRAY) ×2 IMPLANT
PAD ARMBOARD 7.5X6 YLW CONV (MISCELLANEOUS) ×6 IMPLANT
PIN DISTRACTION 14MM (PIN) ×4 IMPLANT
PLATE ZEVO 2LVL 37MM (Plate) ×2 IMPLANT
PUTTY DBF 3CC CORTICAL FIBERS (Putty) ×1 IMPLANT
SCREW VA SD 3.5X16 (Screw) ×6 IMPLANT
SPONGE INTESTINAL PEANUT (DISPOSABLE) ×3 IMPLANT
SPONGE SURGIFOAM ABS GEL SZ50 (HEMOSTASIS) ×2 IMPLANT
SPONGE T-LAP 4X18 ~~LOC~~+RFID (SPONGE) ×2 IMPLANT
STAPLER VISISTAT 35W (STAPLE) IMPLANT
STRIP CLOSURE SKIN 1/2X4 (GAUZE/BANDAGES/DRESSINGS) ×2 IMPLANT
SUT VIC AB 2-0 CP2 18 (SUTURE) ×1 IMPLANT
SUT VIC AB 3-0 SH 8-18 (SUTURE) ×1 IMPLANT
TAPE SURG TRANSPORE 1 IN (GAUZE/BANDAGES/DRESSINGS) ×1 IMPLANT
TAPE SURGICAL TRANSPORE 1 IN (GAUZE/BANDAGES/DRESSINGS) ×2
TOWEL GREEN STERILE (TOWEL DISPOSABLE) ×2 IMPLANT
TOWEL GREEN STERILE FF (TOWEL DISPOSABLE) ×2 IMPLANT
WATER STERILE IRR 1000ML POUR (IV SOLUTION) ×2 IMPLANT

## 2021-05-06 NOTE — Progress Notes (Signed)
   Providing Compassionate, Quality Care - Together  NEUROSURGERY PROGRESS NOTE   S: pt s/e in pacu, doing well, complains of neck pain, arm radic improved  O: EXAM:  BP (!) 156/82 (BP Location: Right Arm)   Pulse 68   Temp 98.9 F (37.2 C)   Resp 11   Ht '5\' 6"'$  (1.676 m)   Wt 87.5 kg   LMP 04/13/2021 (Exact Date)   SpO2 97%   BMI 31.15 kg/m   Awake, alert, oriented  Speech fluent, appropriate  CNs grossly intact  5/5 BUE/BLE  Trachea midline Nml phonation Neck soft  ASSESSMENT:  45 y.o. 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  Please do not hesitate to call with questions or concerns.   Elwin Sleight, [facility] Cell: [phone]

## 2021-05-06 NOTE — Anesthesia Procedure Notes (Signed)
Procedure Name: Intubation Date/Time: 05/06/2021 3:36 PM Performed by: Kathryne Hitch, CRNA Pre-anesthesia Checklist: Patient identified, Emergency Drugs available, Suction available and Patient being monitored Patient Re-evaluated:Patient Re-evaluated prior to induction Oxygen Delivery Method: Circle system utilized Preoxygenation: Pre-oxygenation with 100% oxygen Induction Type: IV induction Ventilation: Mask ventilation without difficulty Laryngoscope Size: Glidescope and 4 Grade View: Grade I Tube type: Oral Tube size: 7.0 mm Number of attempts: 1 Airway Equipment and Method: Stylet and Oral airway Placement Confirmation: ETT inserted through vocal cords under direct vision, positive ETCO2 and breath sounds checked- equal and bilateral Secured at: 22 cm Tube secured with: Tape Dental Injury: Teeth and Oropharynx as per pre-operative assessment

## 2021-05-06 NOTE — Op Note (Signed)
Date of service: 05/06/2021  PREOP DIAGNOSIS: Cervical spondylosis with right radiculopathy, C4-5, C5-6  POSTOP DIAGNOSIS: Same  PROCEDURE: 1. Arthrodesis C4-5, C5-6, anterior interbody technique  2. Placement of intervertebral biomechanical device C4-5, C5-6; Medtronic Titan interbodies (78m C4-5, 728mC5-6) 3. Placement of anterior instrumentation consisting of interbody plate and screws -C4, C5, C6; Medtronic zevo plate 4. Discectomy at C4-5, C5-6 for decompression of spinal cord and exiting nerve roots  5. Use of morselized bone allograft  6.  Use of autograft, same incision 7. Use of intraoperative microscope  SURGEON: Dr. TrElwin SleightDO  ASSISTANT: JoWeston BrassNP  ANESTHESIA: General Endotracheal  EBL: 25 cc  SPECIMENS: None  DRAINS: None  COMPLICATIONS: None immediate  CONDITION: Hemodynamically stable to PACU  HISTORY: Shari Collins a [age].o. y.o. female who initially presented to the outpatient clinic with signs and symptoms consistent with C5, C6 radiculopathy on the right arm. MRI demonstrated C4-5 stenosis with severe foraminal stenosis on the right, C5-6 DDD with moderate foraminal stenosis. She failed conservative measures including ESI and medication. Treatment options were discussed including further conservative measures or ACDF C4-5, C5-6. After all questions were answered, informed consent was obtained.  PROCEDURE IN DETAIL: The patient was brought to the operating room and transferred to the operative table. After induction of general anesthesia, the patient was positioned on the operative table in the supine position with all pressure points meticulously padded. The skin of the neck was then prepped and draped in the usual sterile fashion.  After timeout was conducted, the skin was infiltrated with local anesthetic. Skin incision was then made sharply With a 10 blade and Bovie electrocautery was used to dissect the subcutaneous tissue until the  platysma was identified. The platysma was then divided and undermined. The sternocleidomastoid muscle was then identified and, utilizing natural fascial planes in the neck, the prevertebral fascia was identified and the carotid sheath was retracted laterally and the trachea and esophagus retracted medially. Again using fluoroscopy, the correct disc space , C4-5 and C5-6 was identified. Bovie electrocautery was used to dissect in the subperiosteal plane and elevate the bilateral longus coli muscles at C4, C5 and C6. Self-retaining retractors were then placed under the bilateral longus coli muscles at C5-6. At this point, the microscope was draped and brought into the field, and the remainder of the case was done under the microscope using microdissecting technique. Distraction pins were placed in the C5 and C6 vertebral bodies in midline.    There was a large disc osteophyte complex eccentric to the right at C5-6 which was removed with osteophyte tool/ Roger and high-speed drill. This bone was saved for autograft for later use.The disc space was incised sharply and rongeurs were use to initially complete a discectomy.  The disc space was placed under distraction.The high-speed drill was then used to complete discectomy until the posterior annulus was identified and removed and the posterior longitudinal ligament was identified. Using a nerve hook, the PLL was elevated, and Kerrison rongeurs were used to remove the posterior longitudinal ligament and the ventral thecal sac was identified. Using a combination of curettes and ronguers, complete decompression of the thecal sac and exiting nerve roots at this level was completed, and verified using micro-nerve hook.   There was broad-based disc bulging at this level with severe foraminal stenosis and osteophyte formation in the  right lateral recess and foramina.  The disc space was taken off distraction.  Again confirmation of bilateral nerve root  decompression was  confirmed with blunt nerve hook.  Epidural hemostasis was achieved with Surgiflo and cottonoid.  Having completed our decompression, attention was turned to placement of the intervertebral device. Trial spacers were used to select a   7 mm graft. This graft was then filled with morcellized allograft and autograft, and inserted under live fluoroscopy.  Attention was then turned to C4-5 diskectomy.  Distraction pin was removed from C6 and midline, hemostasis was achieved with Surgiflo.  Distraction pin was placed in midline at C4.  The retractors were placed under the longus colli bilaterally at C4-5. The disc space was incised sharply and rongeurs were use to initially complete a discectomy.  The disc space was placed under distraction.The high-speed drill was then used to complete discectomy until the posterior annulus was identified and removed and the posterior longitudinal ligament was identified. Using a nerve hook, the PLL was elevated, and Kerrison rongeurs were used to remove the posterior longitudinal ligament and the ventral thecal sac was identified. Using a combination of curettes and ronguers, complete decompression of the thecal sac and exiting nerve roots at this level was completed, and verified using micro-nerve hook.   There was broad-based disc bulging at this level with severe foraminal stenosis and osteophyte formation in the right lateral recess and foramina with multiple small disc fragments in the right lateral recess. The disc space was taken off distraction.  Again confirmation of bilateral nerve root decompression was confirmed with blunt nerve hook.  Epidural hemostasis was achieved with Surgiflo and cottonoid.  Having completed our decompression, attention was turned to placement of the intervertebral device. Trial spacers were used to select a  6 mm graft. This graft was then filled with morcellized allograft and autograft, and inserted under live fluoroscopy.  Distraction pins  were removed from C5 and C4, hemostasis was achieved with Surgiflo.  The anterior portion of the C4, C5 and C6 vertebral bodies were decorticated with a high-speed drill.  After placement of the intervertebral device, the above anterior cervical plate was selected, and placed across the interspaces. Using a high-speed drill, the cortex of the cervical vertebral bodies was punctured, and screws inserted in the C4, C5 and C6 vertebral bodies bilaterally. Final fluoroscopic images in AP and lateral projections were taken to confirm good hardware placement.  At this point, after all counts were verified to be correct, meticulous hemostasis was secured using a combination of bipolar electrocautery and passive hemostatics. The platysma muscle was then closed using interrupted 2-0 Vicryl sutures, and the skin was closed with an interrupted 3-0 Vicryl sutures. Dermabond and sterile dressings were then applied and the drapes removed.  The patient tolerated the procedure well and was extubated in the room and taken to the postanesthesia care unit in stable condition.

## 2021-05-06 NOTE — Progress Notes (Signed)
MDA notified that patient is complaining of discomfort in chest.  MDA wants bedside EKG, Troponin I and Pepcid '20mg'$  IV.  Will continue to monitor and notify for changes.

## 2021-05-06 NOTE — Progress Notes (Signed)
Pepcid hung.  MDA notified EKG complete.  Phlebotomy notified of the need for labs.  Will continue to monitor.

## 2021-05-06 NOTE — Progress Notes (Signed)
EKG performed at bedside.

## 2021-05-06 NOTE — Transfer of Care (Signed)
Immediate Anesthesia Transfer of Care Note  Patient: Shari Collins  Procedure(s) Performed: ANTERIOR CERVICAL DECOMPRESSION FUSION CERVICAL FOUR-FIVE, CERVICAL FIVE-SIX (Spine Cervical)  Patient Location: PACU  Anesthesia Type:General  Level of Consciousness: awake, alert  and oriented  Airway & Oxygen Therapy: Patient Spontanous Breathing and Patient connected to face mask oxygen  Post-op Assessment: Report given to RN and Post -op Vital signs reviewed and stable  Post vital signs: Reviewed and stable  Last Vitals:  Vitals Value Taken Time  BP 144/84 05/06/21 1753  Temp    Pulse 78 05/06/21 1753  Resp 12 05/06/21 1753  SpO2 99 % 05/06/21 1753  Vitals shown include unvalidated device data.  Last Pain:  Vitals:   05/06/21 1141  TempSrc:   PainSc: 6          Complications: No notable events documented.

## 2021-05-06 NOTE — Progress Notes (Signed)
Orthopedic Tech Progress Note Patient Details:  Shari Collins 1975/12/14 [id number] Aspen collar was delivered to patient's room  Ortho Devices Type of Ortho Device: Aspen cervical collar Ortho Device/Splint Location: Neck Ortho Device/Splint Interventions: [place] Hattie Aguinaldo 05/06/2021, 8:37 PM

## 2021-05-06 NOTE — H&P (Signed)
Providing Compassionate, Quality Care - Together  NEUROSURGERY HISTORY & PHYSICAL   Shari Collins is an 45 y.o. female.   Chief Complaint: Neck pain with right upper extremity radiculopathy HPI: This is a pleasant 45 year old female that noticed right upper extremity numbness, tingling and weakness while at work.  She began to have severe right upper extremity pain that led to a work-up and was found to have cervical stenosis with radiculopathy at C4-5 and C5-6 on the right.  Her right arm and severe pain radiating down into her first 3 digits.  She has difficulty lifting things overhead due to this and has some weakness in her arm due to this.  She also has chronic neck pain has been progressively worsening for quite some time.  She failed conservative measures including pain control and epidural steroid injections.  She presents today for an ACDF C4-6.  She has received medical clearance  Past Medical History:  Diagnosis Date   Anxiety    pt states she was diagnosed years ago   Asthma    Bipolar disorder ([place])    pt states she was diagnosed years ago   Depression    Diabetes mellitus    Dysrhythmia    per patient; however cardiac monitor December 2021 without atrial fibrillation. She had isolated PACs and couplets, rare episodes of SVT consistent with atrial tachycardia.  Rare ventricular ectopy with isolated PVCs and couplets.   Hypercholesteremia    Hypertension    Noncompliance    Schizophrenia ([place])    pt states she was diagnosed years ago    Past Surgical History:  Procedure Laterality Date   [place] and 2007   ESOPHAGOGASTRODUODENOSCOPY (EGD) WITH PROPOFOL N/A 01/30/2021   Procedure: ESOPHAGOGASTRODUODENOSCOPY (EGD) WITH PROPOFOL;  Surgeon: Daneil Dolin, MD;  [facility];  Service: Endoscopy;  Laterality: N/A;  12:30pm   MALONEY DILATION N/A 01/30/2021   Procedure: Venia Minks DILATION;   Surgeon: Daneil Dolin, MD;  Location: [facility];  Service: Endoscopy;  Laterality: N/A;   TONSILLECTOMY      Family History  Problem Relation Age of Onset   Hypertension Paternal Grandmother    Hypertension Maternal Grandmother    Diabetes Father    Hypertension Father    Cancer Mother        liver   Heart attack Paternal Grandfather    Diabetes Brother    Colon cancer Neg Hx    Social History:  reports that she has been smoking cigarettes. She has a 10.00 pack-year smoking history. She has never used smokeless tobacco. She reports that she does not currently use alcohol. She reports that she does not use drugs.  Allergies:  Allergies  Allergen Reactions   Bee Venom Anaphylaxis and Swelling   Buspar [Buspirone] Itching and Anxiety    Dizziness, burning sensation, pupils dilated   Latex Itching   Prednisone     Raises blood sugar   Reglan [Metoclopramide] Hives and Itching   Sulfa Antibiotics Itching and Rash    Medications Prior to Admission  Medication Sig Dispense Refill   atorvastatin (LIPITOR) 20 MG tablet TAKE 1 Tablet BY MOUTH ONCE EVERY DAY 90 tablet 1   insulin glargine (LANTUS SOLOSTAR) 100 UNIT/ML Solostar Pen Inject 35 Units into the skin at bedtime. 15 mL    lisinopril (ZESTRIL) 10 MG tablet TAKE 1 Tablet BY MOUTH ONCE EVERY DAY 90  tablet 1   metFORMIN (GLUCOPHAGE) 1000 MG tablet TAKE 1 Tablet  BY MOUTH TWICE DAILY WITH A MEAL 180 tablet 1   metoprolol tartrate (LOPRESSOR) 25 MG tablet Take 25 mg by mouth daily.     omeprazole (PRILOSEC) 40 MG capsule TAKE 1 Capsule BY MOUTH ONCE EVERY DAY (Patient taking differently: Take 40 mg by mouth in the morning and at bedtime.) 90 capsule 1   cephALEXin (KEFLEX) 500 MG capsule Take 1 capsule (500 mg total) by mouth 4 (four) times daily. (Patient not taking: No sig reported) 28 capsule 0   diphenhydrAMINE (BENADRYL) 25 MG tablet Take 25 mg by mouth daily as needed for allergies.     fenofibrate (TRICOR) 145 MG tablet  Take 1 tablet (145 mg total) by mouth daily. (Patient not taking: Reported on 04/23/2021) 30 tablet 4   methocarbamol (ROBAXIN) 500 MG tablet Take 1 tablet (500 mg total) by mouth 3 (three) times daily. 21 tablet 0   metoprolol tartrate (LOPRESSOR) 50 MG tablet TAKE 1 Tablet  BY MOUTH TWICE DAILY (Patient not taking: No sig reported) 180 tablet 0   naproxen (NAPROSYN) 500 MG tablet Take 1 tablet (500 mg total) by mouth 2 (two) times daily. (Patient not taking: Reported on 04/23/2021) 20 tablet 0   PROVENTIL HFA 108 (90 Base) MCG/ACT inhaler INHALE 2 PUFFS BY MOUTH EVERY 6 HOURS AS NEEDED FOR COUGHING, WHEEZING, OR SHORTNESS OF BREATH 20.1 g 1   triamcinolone cream (KENALOG) 0.1 % APPLY TO THE AFFECTED AREA TWICE DAILY AS NEEDED 60 g 1    Results for orders placed or performed during the hospital encounter of 05/06/21 (from the past 48 hour(s))  SARS Coronavirus 2 by RT PCR (hospital order, performed in [facility] hospital lab) Nasopharyngeal Nasopharyngeal Swab     Status: None   Collection Time: 05/06/21 10:30 AM   Specimen: Nasopharyngeal Swab  Result Value Ref Range   SARS Coronavirus 2 NEGATIVE NEGATIVE    Comment: (NOTE) SARS-CoV-2 target nucleic acids are NOT DETECTED.  The SARS-CoV-2 RNA is generally detectable in upper and lower respiratory specimens during the acute phase of infection. The lowest concentration of SARS-CoV-2 viral copies this assay can detect is 250 copies / mL. A negative result does not preclude SARS-CoV-2 infection and should not be used as the sole basis for treatment or other patient management decisions.  A negative result may occur with improper specimen collection / handling, submission of specimen other than nasopharyngeal swab, presence of viral mutation(s) within the areas targeted by this assay, and inadequate number of viral copies (<250 copies / mL). A negative result must be combined with clinical observations, patient history, and epidemiological  information.  Fact Sheet for Patients:   StrictlyIdeas.no  Fact Sheet for Healthcare Providers: BankingDealers.co.za  This test is not yet approved or  cleared by the [place] FDA and has been authorized for detection and/or diagnosis of SARS-CoV-2 by FDA under an Emergency Use Authorization (EUA).  This EUA will remain in effect (meaning this test can be used) for the duration of the COVID-19 declaration under Section 564(b)(1) of the Act, 21 U.S.C. section 360bbb-3(b)(1), unless the authorization is terminated or revoked sooner.  Performed at [facility], [place] [address] [place], [place] [address]   Glucose, capillary     Status: Abnormal   Collection Time: 05/06/21 10:52 AM  Result Value Ref Range   Glucose-Capillary 196 (H) 70 - 99 mg/dL    Comment: Glucose reference range applies  only to samples taken after fasting for at least 8 hours.  Pregnancy, urine POC     Status: None   Collection Time: 05/06/21 11:03 AM  Result Value Ref Range   Preg Test, Ur NEGATIVE NEGATIVE    Comment:        THE SENSITIVITY OF THIS METHODOLOGY IS >24 mIU/mL   ABO/Rh     Status: None   Collection Time: 05/06/21 11:40 AM  Result Value Ref Range   ABO/RH(D)      A POS Performed at [facility] [address] [place], [place] [address]   Glucose, capillary     Status: Abnormal   Collection Time: 05/06/21  1:12 PM  Result Value Ref Range   Glucose-Capillary 149 (H) 70 - 99 mg/dL    Comment: Glucose reference range applies only to samples taken after fasting for at least 8 hours.   No results found.  ROS All positives and negatives are listed in HPI above  Blood pressure (!) 156/76, pulse 69, temperature 98.4 F (36.9 C), temperature source Oral, resp. rate 18, height '5\' 6"'$  (1.676 m), weight 87.5 kg, last menstrual period 04/13/2021, SpO2 100 %. Physical Exam  A&O x3, tearful No acute distress PERRLA EOMI Right upper  extremity delt, bi, tri 4+/5, remainder 5/5 Left upper, bilateral lower extremity 5/5 C5, C6 decreased sensation light touch right upper extremity  Assessment/Plan 45 year old female with  C4-6 spondylosis with radiculopathy and severe foraminal stenosis  -Or today for ACDF C4-6.  All risks, benefits and expected outcomes discussed and agreed upon with the patient.   Thank you for allowing me to participate in this patient's care.  Please do not hesitate to call with questions or concerns.   Elwin Sleight, [facility] Cell: [phone]

## 2021-05-07 ENCOUNTER — Ambulatory Visit: Payer: Self-pay | Admitting: Physician Assistant

## 2021-05-07 DIAGNOSIS — M4722 Other spondylosis with radiculopathy, cervical region: Secondary | ICD-10-CM | POA: Diagnosis not present

## 2021-05-07 LAB — GLUCOSE, CAPILLARY: Glucose-Capillary: 218 mg/dL — ABNORMAL HIGH (ref 70–99)

## 2021-05-07 MED ORDER — HYDROCODONE-ACETAMINOPHEN 5-325 MG PO TABS: 1.0000 | ORAL_TABLET | ORAL | 0 refills | Status: DC | PRN

## 2021-05-07 MED ORDER — METHOCARBAMOL 500 MG PO TABS: 500.0000 mg | ORAL_TABLET | Freq: Four times a day (QID) | ORAL | 0 refills | Status: DC

## 2021-05-07 NOTE — Progress Notes (Signed)
Subjective: Patient reports that she is doing well. She continues to have some RUE radiculopathy and appropriate incisional and upper back discomfort. No acute events overnight.   Objective: Vital signs in last 24 hours: Temp:  [97.6 F (36.4 C)-98.9 F (37.2 C)] 97.7 F (36.5 C) (08/31 0746) Pulse Rate:  [58-77] 77 (08/31 0746) Resp:  [9-18] 17 (08/31 0746) BP: (123-158)/(52-106) 136/71 (08/31 0746) SpO2:  [91 %-100 %] 100 % (08/31 0746) Weight:  [87.5 kg] 87.5 kg (08/30 1048)  Intake/Output from previous day: 08/30 0701 - 08/31 0700 In: 1240 [P.O.:240; I.V.:800; IV Piggyback:200] Out: 50 [Blood:50] Intake/Output this shift: No intake/output data recorded.  Physical Exam: Patient is awake, A/O X 4, conversant, and in good spirits. They are in NAD and VSS. Doing well. Speech is fluent and appropriate. MAEW with good strength that is symmetric bilaterally. Sensation to light touch is intact. PERLA, EOMI. CNs grossly intact. Dressing is clean dry intact. Incision is well approximated with no drainage, erythema, or edema. Hard cervical collar in place   Lab Results: No results for input(s): WBC, HGB, HCT, PLT in the last 72 hours. BMET No results for input(s): NA, K, CL, CO2, GLUCOSE, BUN, CREATININE, CALCIUM in the last 72 hours.  Studies/Results: DG Cervical Spine 2 or 3 views  Result Date: 05/06/2021 CLINICAL DATA:  ACDF EXAM: CERVICAL SPINE - 2-3 VIEW COMPARISON:  05/22/2020 FINDINGS: Two low resolution intraoperative spot views of the cervical spine. Total fluoroscopy time was 9 seconds. The images demonstrate anterior plate and fixating screws C4 through C6 with interbody device at C4-C5 and C5-C6. IMPRESSION: Intraoperative fluoroscopic assistance provided during cervical spine surgery Electronically Signed   By: Donavan Foil M.D.   On: 05/06/2021 19:58   DG C-Arm 1-60 Min-No Report  Result Date: 05/06/2021 Fluoroscopy was utilized by the requesting physician.  No  radiographic interpretation.    Assessment/Plan: Patient is post-op day 1 s/p C4-6 ACDF. She is recovering well and reports a reduction of her preoperative symptoms. She is awaiting physical and occupational therapy evaluation.  Continue hard cervical collar. Continue working on pain control, mobility and ambulating patient. Will plan for discharge today.    LOS: 0 days     Marvis Moeller, DNP, NP-C 05/07/2021, 8:08 AM

## 2021-05-07 NOTE — Discharge Summary (Signed)
Physician Discharge Summary  Patient ID: Shari Collins MRN: [id number] DOB/AGE: March 27, 1976 45 y.o.  Admit date: 05/06/2021 Discharge date: 05/07/2021  Admission Diagnoses: Cervical spondylosis with right radiculopathy, C4-5, C5-6  Discharge Diagnoses: Cervical spondylosis with right radiculopathy, C4-5, C5-6 Active Problems:   Cervical radiculopathy at C6   Discharged Condition: good  Hospital Course: The patient was admitted on 05/06/2021 and taken to the operating room where the patient underwent C4-6 ACDF. The patient tolerated the procedure well and was taken to the recovery room and then to the floor in stable condition. The hospital course was routine. There were no complications. The wound remained clean dry and intact. Pt had appropriate neck and upper back soreness. No complaints of new arm pain or new N/T/W. The patient remained afebrile with stable vital signs, and tolerated a regular diet. The patient continued to increase activities, and pain was well controlled with oral pain medications.   Consults: None  Significant Diagnostic Studies: radiology: X-Ray: intraoperative   Treatments: surgery:  1. Arthrodesis C4-5, C5-6, anterior interbody technique  2. Placement of intervertebral biomechanical device C4-5, C5-6; Medtronic Titan interbodies (51m C4-5, 732mC5-6) 3. Placement of anterior instrumentation consisting of interbody plate and screws -C4, C5, C6; Medtronic zevo plate 4. Discectomy at C4-5, C5-6 for decompression of spinal cord and exiting nerve roots  5. Use of morselized bone allograft  6.  Use of autograft, same incision 7. Use of intraoperative microscope   Discharge Exam: Blood pressure 136/71, pulse 77, temperature 97.7 F (36.5 C), temperature source Oral, resp. rate 17, height '5\' 6"'$  (1.676 m), weight 87.5 kg, last menstrual period 04/13/2021, SpO2 100 %.  Physical Exam: Patient is awake, A/O X 4, conversant, and in good spirits. They are in NAD and  VSS. Doing well. Speech is fluent and appropriate. MAEW with good strength that is symmetric bilaterally. Sensation to light touch is intact. PERLA, EOMI. CNs grossly intact. Dressing is clean dry intact. Incision is well approximated with no drainage, erythema, or edema. Hard cervical collar in place  Disposition: Discharge disposition: 01-Home or Self Care       Discharge Instructions     Incentive spirometry RT   Complete by: As directed       Allergies as of 05/07/2021       Reactions   Bee Venom Anaphylaxis, Swelling   Buspar [buspirone] Itching, Anxiety   Dizziness, burning sensation, pupils dilated   Latex Itching   Prednisone    Raises blood sugar   Reglan [metoclopramide] Hives, Itching   Sulfa Antibiotics Itching, Rash        Medication List     TAKE these medications    atorvastatin 20 MG tablet Commonly known as: LIPITOR TAKE 1 Tablet BY MOUTH ONCE EVERY DAY   cephALEXin 500 MG capsule Commonly known as: KEFLEX Take 1 capsule (500 mg total) by mouth 4 (four) times daily.   diphenhydrAMINE 25 MG tablet Commonly known as: BENADRYL Take 25 mg by mouth daily as needed for allergies.   fenofibrate 145 MG tablet Commonly known as: TRICOR Take 1 tablet (145 mg total) by mouth daily.   HYDROcodone-acetaminophen 5-325 MG tablet Commonly known as: NORCO/VICODIN Take 1 tablet by mouth every 4 (four) hours as needed for moderate pain.   Lantus SoloStar 100 UNIT/ML Solostar Pen Generic drug: insulin glargine Inject 35 Units into the skin at bedtime.   lisinopril 10 MG tablet Commonly known as: ZESTRIL TAKE 1 Tablet BY MOUTH ONCE EVERY DAY  metFORMIN 1000 MG tablet Commonly known as: GLUCOPHAGE TAKE 1 Tablet  BY MOUTH TWICE DAILY WITH A MEAL   methocarbamol 500 MG tablet Commonly known as: Robaxin Take 1 tablet (500 mg total) by mouth 4 (four) times daily. What changed: when to take this   metoprolol tartrate 25 MG tablet Commonly known as:  LOPRESSOR Take 25 mg by mouth daily.   metoprolol tartrate 50 MG tablet Commonly known as: LOPRESSOR TAKE 1 Tablet  BY MOUTH TWICE DAILY   naproxen 500 MG tablet Commonly known as: NAPROSYN Take 1 tablet (500 mg total) by mouth 2 (two) times daily.   omeprazole 40 MG capsule Commonly known as: PRILOSEC TAKE 1 Capsule BY MOUTH ONCE EVERY DAY What changed: See the new instructions.   Proventil HFA 108 (90 Base) MCG/ACT inhaler Generic drug: albuterol INHALE 2 PUFFS BY MOUTH EVERY 6 HOURS AS NEEDED FOR COUGHING, WHEEZING, OR SHORTNESS OF BREATH   triamcinolone cream 0.1 % Commonly known as: KENALOG APPLY TO THE AFFECTED AREA TWICE DAILY AS NEEDED         Signed: Marvis Moeller, DNP, NP-C 05/07/2021, 8:28 AM

## 2021-05-07 NOTE — Anesthesia Postprocedure Evaluation (Signed)
Anesthesia Post Note  Patient: Shari Collins  Procedure(s) Performed: ANTERIOR CERVICAL DECOMPRESSION FUSION CERVICAL FOUR-FIVE, CERVICAL FIVE-SIX (Spine Cervical)     Patient location during evaluation: PACU Anesthesia Type: General Level of consciousness: awake and alert Pain management: pain level controlled Vital Signs Assessment: post-procedure vital signs reviewed and stable Respiratory status: spontaneous breathing, nonlabored ventilation, respiratory function stable and patient connected to nasal cannula oxygen Cardiovascular status: blood pressure returned to baseline and stable Postop Assessment: no apparent nausea or vomiting Anesthetic complications: no   No notable events documented.  Last Vitals:  Vitals:   05/06/21 2359 05/07/21 0320  BP: 123/74 (!) 139/94  Pulse: (!) 58 62  Resp: 18 18  Temp: 36.5 C 36.4 C  SpO2: 98% 98%    Last Pain:  Vitals:   05/07/21 0359  TempSrc:   PainSc: [place]

## 2021-05-07 NOTE — Evaluation (Signed)
Occupational Therapy Evaluation Patient Details Name: Shari Collins MRN: [id number] DOB: 1975/09/13 Today's Date: 05/07/2021    History of Present Illness Pt is a 45 y/o female who presents s/p 2 level ACDF on 05/06/2021. PMH significant for Bipolar disorder, DM, HTN, schizophrenia.   Clinical Impression   Pt admitted for procedure listed above. PTA pt reported independence with all ADL's and IADL's, including working and driving. Pt did not disclose much about living situation, reported living in a motel with significant other to OT. At this time, pt continued to demonstrate independence with all ADL's, OT provided education on precautions and compensatory techniques for ADL's, which pt was agreeable to. Pt needs no OT services at this time and acute OT will sign off.     Follow Up Recommendations  Follow surgeon's recommendation for DC plan and follow-up therapies;No OT follow up    Equipment Recommendations  None recommended by OT    Recommendations for Other Services       Precautions / Restrictions Precautions Precautions: Fall Required Braces or Orthoses: Cervical Brace Cervical Brace: Hard collar Restrictions Weight Bearing Restrictions: No      Mobility Bed Mobility               General bed mobility comments: Verbally reviewed log roll technique. Did not practice during session.    Transfers Overall transfer level: Modified independent Equipment used: None Transfers: Sit to/from Stand           General transfer comment: No assist required. VC's for improved posture.    Balance Overall balance assessment: Needs assistance Sitting-balance support: Feet supported;No upper extremity supported Sitting balance-Leahy Scale: Fair     Standing balance support: No upper extremity supported;During functional activity Standing balance-Leahy Scale: Fair                             ADL either performed or assessed with clinical judgement   ADL  Overall ADL's : Modified independent                                       General ADL Comments: Pt able to complete all ADL's, OT provided education on compensatory techniques for dressing, grooming, and bathing, in order to follow precautions.     Vision Baseline Vision/History: 1 Wears glasses Ability to See in Adequate Light: 0 Adequate Patient Visual Report: No change from baseline Vision Assessment?: No apparent visual deficits     Perception Perception Perception Tested?: No   Praxis Praxis Praxis tested?: Not tested    Pertinent Vitals/Pain Pain Assessment: Faces Faces Pain Scale: Hurts whole lot Pain Location: Incision site/neck/shoulders Pain Descriptors / Indicators: Operative site guarding;Sore;Crying Pain Intervention(s): Limited activity within patient's tolerance;Premedicated before session;Monitored during session     Hand Dominance Right   Extremity/Trunk Assessment Upper Extremity Assessment Upper Extremity Assessment: Overall WFL for tasks assessed   Lower Extremity Assessment Lower Extremity Assessment: Defer to PT evaluation   Cervical / Trunk Assessment Cervical / Trunk Assessment: Other exceptions Cervical / Trunk Exceptions: s/p surgery   Communication Communication Communication: No difficulties   Cognition Arousal/Alertness: Awake/alert Behavior During Therapy: WFL for tasks assessed/performed Overall Cognitive Status: Within Functional Limits for tasks assessed  General Comments  VSS on RA    Exercises     Shoulder Instructions      Home Living Family/patient expects to be discharged to:: Unsure Living Arrangements: Other (Comment) Available Help at Discharge: Friend(s);Available PRN/intermittently                             Additional Comments: Pt told OT that she and significant other would be going to a motel. Pt reports to PT that she will be  going to a friends house and sleeping on the couch. Significant other in the room during session and did not offer any information.      Prior Functioning/Environment Level of Independence: Independent                 OT Problem List: Decreased range of motion;Decreased activity tolerance;Pain      OT Treatment/Interventions:      OT Goals(Current goals can be found in the care plan section) Acute Rehab OT Goals Patient Stated Goal: To leave OT Goal Formulation: With patient Time For Goal Achievement: 05/07/21 Potential to Achieve Goals: Good  OT Frequency:     Barriers to D/C:            Co-evaluation              AM-PAC OT "6 Clicks" Daily Activity     Outcome Measure Help from another person eating meals?: None Help from another person taking care of personal grooming?: None Help from another person toileting, which includes using toliet, bedpan, or urinal?: None Help from another person bathing (including washing, rinsing, drying)?: None Help from another person to put on and taking off regular upper body clothing?: None Help from another person to put on and taking off regular lower body clothing?: None 6 Click Score: 24   End of Session Equipment Utilized During Treatment: Cervical collar Nurse Communication: Mobility status  Activity Tolerance: Patient tolerated treatment well Patient left: in chair;with family/visitor present  OT Visit Diagnosis: Muscle weakness (generalized) (M62.81);Pain Pain - Right/Left:  (Both) Pain - part of body:  (neck and shoulders)                Time: [id number] OT Time Calculation (min): 12 min Charges:  OT General Charges $OT Visit: 1 Visit OT Evaluation $OT Eval Low Complexity: [place]., OTR/L Acute Rehabilitation  Shari Collins Shari Collins 05/07/2021, 10:13 AM

## 2021-05-07 NOTE — Evaluation (Signed)
Physical Therapy Evaluation and Discharge Patient Details Name: Shari Collins MRN: [id number] DOB: 1976/02/12 Today's Date: 05/07/2021   History of Present Illness  Pt is a 45 y/o female who presents s/p 2 level ACDF on 05/06/2021. PMH significant for Bipolar disorder, DM, HTN, schizophrenia.   Clinical Impression  Patient evaluated by Physical Therapy with no further acute PT needs identified. All education has been completed and the patient has no further questions. Pt was able to demonstrate transfers and ambulation with gross modified independence and no AD. Pt was educated on precautions, brace application/wearing schedule, appropriate activity progression, and car transfer. See below for any follow-up Physical Therapy or equipment needs. PT is signing off. Thank you for this referral.     Follow Up Recommendations No PT follow up    Equipment Recommendations  None recommended by PT    Recommendations for Other Services       Precautions / Restrictions Precautions Precautions: Fall Required Braces or Orthoses: Cervical Brace Cervical Brace: Hard collar Restrictions Weight Bearing Restrictions: No      Mobility  Bed Mobility               General bed mobility comments: Verbally reviewed log roll technique. Did not practice during session.    Transfers Overall transfer level: Modified independent Equipment used: None Transfers: Sit to/from Stand           General transfer comment: No assist required. VC's for improved posture.  Ambulation/Gait Ambulation/Gait assistance: Modified independent (Device/Increase time) Gait Distance (Feet): 400 Feet Assistive device: None Gait Pattern/deviations: Step-through pattern;Decreased stride length Gait velocity: Decreased Gait velocity interpretation: 1.31 - 2.62 ft/sec, indicative of limited community ambulator General Gait Details: Slow but generally steady without overt LOB noted.  Stairs             Wheelchair Mobility    Modified Rankin (Stroke Patients Only)       Balance Overall balance assessment: Needs assistance Sitting-balance support: Feet supported;No upper extremity supported Sitting balance-Leahy Scale: Fair     Standing balance support: No upper extremity supported;During functional activity Standing balance-Leahy Scale: Fair                               Pertinent Vitals/Pain Pain Assessment: Faces Faces Pain Scale: Hurts whole lot Pain Location: Incision site/neck/shoulders Pain Descriptors / Indicators: Operative site guarding;Sore;Crying Pain Intervention(s): Limited activity within patient's tolerance;Premedicated before session;Monitored during session    Home Living Family/patient expects to be discharged to:: Unsure Living Arrangements: Other (Comment) Available Help at Discharge: Friend(s);Available PRN/intermittently             Additional Comments: Pt told OT that she and significant other would be going to a motel. Pt reports to PT that she will be going to a friends house and sleeping on the couch. Significant other in the room during session and did not offer any information.    Prior Function Level of Independence: Independent               Hand Dominance   Dominant Hand: Right    Extremity/Trunk Assessment   Upper Extremity Assessment Upper Extremity Assessment: Overall WFL for tasks assessed    Lower Extremity Assessment Lower Extremity Assessment: Defer to PT evaluation    Cervical / Trunk Assessment Cervical / Trunk Assessment: Other exceptions Cervical / Trunk Exceptions: s/p surgery  Communication   Communication: No difficulties  Cognition Arousal/Alertness: Awake/alert Behavior  During Therapy: WFL for tasks assessed/performed Overall Cognitive Status: Within Functional Limits for tasks assessed                                        General Comments General comments (skin  integrity, edema, etc.): VSS on RA    Exercises     Assessment/Plan    PT Assessment Patent does not need any further PT services  PT Problem List Decreased strength;Decreased activity tolerance;Decreased balance;Decreased mobility;Decreased knowledge of use of DME;Decreased safety awareness;Decreased knowledge of precautions;Pain       PT Treatment Interventions      PT Goals (Current goals can be found in the Care Plan section)  Acute Rehab PT Goals Patient Stated Goal: To leave PT Goal Formulation: All assessment and education complete, DC therapy    Frequency     Barriers to discharge Other (comment) Unsure where pt will be discharging to -    Co-evaluation               AM-PAC PT "6 Clicks" Mobility  Outcome Measure Help needed turning from your back to your side while in a flat bed without using bedrails?: None Help needed moving from lying on your back to sitting on the side of a flat bed without using bedrails?: None Help needed moving to and from a bed to a chair (including a wheelchair)?: None Help needed standing up from a chair using your arms (e.g., wheelchair or bedside chair)?: None Help needed to walk in hospital room?: None Help needed climbing 3-5 steps with a railing? : None 6 Click Score: 24    End of Session Equipment Utilized During Treatment: Cervical collar Activity Tolerance: Patient tolerated treatment well Patient left: with call bell/phone within reach;with family/visitor present (Sitting EOB) Nurse Communication: Mobility status PT Visit Diagnosis: Unsteadiness on feet (R26.81);Pain Pain - part of body:  (neck)    Time: [id number] PT Time Calculation (min) (ACUTE ONLY): 10 min   Charges:   PT Evaluation $PT Eval Low Complexity: 1 Low          Rolinda Roan, PT, DPT Acute Rehabilitation Services Pager: [phone] Office: [phone]   Thelma Comp 05/07/2021, 10:27 AM

## 2021-05-07 NOTE — Plan of Care (Signed)
Pt given D/C instructions with verbal understanding. Rx's were sent to the pharmacy by MD. Pt's incision is clean and dry with no sign of infection. Pt's IV was removed prior to D/C. Pt D/C'd home via wheelchair per MD order. Pt is stable @ D/C and has no other needs at this time. Dao Memmott, RN  

## 2021-05-08 ENCOUNTER — Encounter (HOSPITAL_COMMUNITY): Payer: Self-pay | Admitting: Neurological Surgery

## 2021-06-08 NOTE — Progress Notes (Deleted)
Referring Provider: Soyla Dryer, PA-C Primary Care Physician:  Pcp, No Primary GI Physician: Dr. Gala Romney  No chief complaint on file.   HPI:   Shari Collins is a 45 y.o. female presenting today for follow-up of upper abdominal pain, GERD, dysphagia, nausea/vomiting, and constipation s/p EGD in May 2022.  Also with history of cholelithiasis.  Last seen in our office 01/13/2021.  She reported 1 year history of epigastric/LUQ abdominal pain that was constant and described as burning/rubber band popping, worsened by eating and movement at times, no alleviating factors.  PCP started dicyclomine with no improvement.  On Prilosec 40 mg daily for GERD with breakthrough symptoms a couple times a week and solid food dysphagia.  Also reported daily nausea with rare vomiting that she felt was more associated with chronic constipation as she was skipping 2 to 3 weeks between bowel movements.  When bowels move, she would have diarrhea with 3-4 bowel movements per day.  Left-sided pain also worsened with constipation.  Plan included updating labs, RUQ ultrasound, increase PPI to twice daily, EGD, stop dicyclomine, and start Linzess 290 mcg daily.  Samples provided and requested progress report.  EGD 01/30/2021: Normal esophagus dilated, normal examined stomach and duodenum.  Noted patient's abdominal pain improved on twice daily omeprazole.  Patient also reported Linzess 290 mcg was too strong. Patient picked up samples of Linzess 72 mcg in May. No progress report received.  Today:  Upper abdominal pain:  GERD:  Dysphagia:  Nausea/vomiting:  Constipation:     Past Medical History:  Diagnosis Date   Anxiety    pt states she was diagnosed years ago   Asthma    Bipolar disorder ([place])    pt states she was diagnosed years ago   Depression    Diabetes mellitus    Dysrhythmia    per patient; however cardiac monitor December 2021 without atrial fibrillation. She had isolated PACs and  couplets, rare episodes of SVT consistent with atrial tachycardia.  Rare ventricular ectopy with isolated PVCs and couplets.   Hypercholesteremia    Hypertension    Noncompliance    Schizophrenia ([place])    pt states she was diagnosed years ago    Past Surgical History:  Procedure Laterality Date   ANTERIOR CERVICAL DECOMP/DISCECTOMY FUSION N/A 05/06/2021   Procedure: ANTERIOR CERVICAL DECOMPRESSION FUSION CERVICAL FOUR-FIVE, CERVICAL FIVE-SIX;  Surgeon: Dawley, Theodoro Doing, DO;  Location: [facility];  Service: Neurosurgery;  Laterality: N/A;   [place] and 2007   ESOPHAGOGASTRODUODENOSCOPY (EGD) WITH PROPOFOL N/A 01/30/2021   Procedure: ESOPHAGOGASTRODUODENOSCOPY (EGD) WITH PROPOFOL;  Surgeon: Daneil Dolin, MD;  [facility];  Service: Endoscopy;  Laterality: N/A;  12:30pm   MALONEY DILATION N/A 01/30/2021   Procedure: Venia Minks DILATION;  Surgeon: Daneil Dolin, MD;  [facility];  Service: Endoscopy;  Laterality: N/A;   TONSILLECTOMY      Current Outpatient Medications  Medication Sig Dispense Refill   atorvastatin (LIPITOR) 20 MG tablet TAKE 1 Tablet BY MOUTH ONCE EVERY DAY 90 tablet 1   cephALEXin (KEFLEX) 500 MG capsule Take 1 capsule (500 mg total) by mouth 4 (four) times daily. (Patient not taking: No sig reported) 28 capsule 0   diphenhydrAMINE (BENADRYL) 25 MG tablet Take 25 mg by mouth daily as needed for allergies.     fenofibrate (TRICOR) 145 MG tablet Take 1 tablet (145 mg total) by mouth  daily. (Patient not taking: Reported on 04/23/2021) 30 tablet 4   HYDROcodone-acetaminophen (NORCO/VICODIN) 5-325 MG tablet Take 1 tablet by mouth every 4 (four) hours as needed for moderate pain. 20 tablet 0   insulin glargine (LANTUS SOLOSTAR) 100 UNIT/ML Solostar Pen Inject 35 Units into the skin at bedtime. 15 mL    lisinopril (ZESTRIL) 10 MG tablet TAKE 1 Tablet BY MOUTH ONCE EVERY DAY 90 tablet 1   metFORMIN  (GLUCOPHAGE) 1000 MG tablet TAKE 1 Tablet  BY MOUTH TWICE DAILY WITH A MEAL 180 tablet 1   methocarbamol (ROBAXIN) 500 MG tablet Take 1 tablet (500 mg total) by mouth 4 (four) times daily. 90 tablet 0   metoprolol tartrate (LOPRESSOR) 25 MG tablet Take 25 mg by mouth daily.     metoprolol tartrate (LOPRESSOR) 50 MG tablet TAKE 1 Tablet  BY MOUTH TWICE DAILY (Patient not taking: No sig reported) 180 tablet 0   naproxen (NAPROSYN) 500 MG tablet Take 1 tablet (500 mg total) by mouth 2 (two) times daily. (Patient not taking: Reported on 04/23/2021) 20 tablet 0   omeprazole (PRILOSEC) 40 MG capsule TAKE 1 Capsule BY MOUTH ONCE EVERY DAY (Patient taking differently: Take 40 mg by mouth in the morning and at bedtime.) 90 capsule 1   PROVENTIL HFA 108 (90 Base) MCG/ACT inhaler INHALE 2 PUFFS BY MOUTH EVERY 6 HOURS AS NEEDED FOR COUGHING, WHEEZING, OR SHORTNESS OF BREATH 20.1 g 1   triamcinolone cream (KENALOG) 0.1 % APPLY TO THE AFFECTED AREA TWICE DAILY AS NEEDED 60 g 1   No current facility-administered medications for this visit.    Allergies as of 06/09/2021 - Review Complete 05/06/2021  Allergen Reaction Noted   Bee venom Anaphylaxis and Swelling 07/04/2013   Buspar [buspirone] Itching and Anxiety 08/10/2017   Latex Itching 09/21/2015   Prednisone  01/23/2021   Reglan [metoclopramide] Hives and Itching 04/12/2017   Sulfa antibiotics Itching and Rash 11/22/2011    Family History  Problem Relation Age of Onset   Hypertension Paternal Grandmother    Hypertension Maternal Grandmother    Diabetes Father    Hypertension Father    Cancer Mother        liver   Heart attack Paternal Grandfather    Diabetes Brother    Colon cancer Neg Hx     Social History   Socioeconomic History   Marital status: Single    Spouse name: Not on file   Number of children: Not on file   Years of education: Not on file   Highest education level: Not on file  Occupational History   Not on file  Tobacco Use    Smoking status: Every Day    Packs/day: 0.50    Years: 20.00    Pack years: 10.00    Types: Cigarettes   Smokeless tobacco: Never  Vaping Use   Vaping Use: Never used  Substance and Sexual Activity   Alcohol use: Not Currently   Drug use: No   Sexual activity: Yes    Birth control/protection: None  Other Topics Concern   Not on file  Social History Narrative   Not on file   Social Determinants of Health   Financial Resource Strain: Not on file  Food Insecurity: Not on file  Transportation Needs: Not on file  Physical Activity: Not on file  Stress: Not on file  Social Connections: Not on file    Review of Systems: Gen: Denies fever, chills, anorexia. Denies fatigue, weakness, weight loss.  CV: Denies chest pain, palpitations, syncope, peripheral edema, and claudication. Resp: Denies dyspnea at rest, cough, wheezing, coughing up blood, and pleurisy. GI: Denies vomiting blood, jaundice, and fecal incontinence.   Denies dysphagia or odynophagia. Derm: Denies rash, itching, dry skin Psych: Denies depression, anxiety, memory loss, confusion. No homicidal or suicidal ideation.  Heme: Denies bruising, bleeding, and enlarged lymph nodes.  Physical Exam: There were no vitals taken for this visit. General:   Alert and oriented. No distress noted. Pleasant and cooperative.  Head:  Normocephalic and atraumatic. Eyes:  Conjuctiva clear without scleral icterus. Mouth:  Oral mucosa pink and moist. Good dentition. No lesions. Heart:  S1, S2 present without murmurs appreciated. Lungs:  Clear to auscultation bilaterally. No wheezes, rales, or rhonchi. No distress.  Abdomen:  +BS, soft, non-tender and non-distended. No rebound or guarding. No HSM or masses noted. Msk:  Symmetrical without gross deformities. Normal posture. Extremities:  Without edema. Neurologic:  Alert and  oriented x4 Psych:  Alert and cooperative. Normal mood and affect.

## 2021-06-09 ENCOUNTER — Ambulatory Visit: Payer: Self-pay | Admitting: Gastroenterology

## 2021-08-22 ENCOUNTER — Telehealth: Payer: Self-pay

## 2021-08-22 NOTE — Telephone Encounter (Signed)
Client recently renewed with [facility]. She wishes to [facility].  Appointment made for 08/25/21 at [place] client to bring her medications that she may have to her appointment. Client with history of DM, Hypertension, Increased cholesterol per client history. She also reports having disc surgery in Aug 2022. She also reports history of Mental health needs. She was last seen per [facility] with [facility], but they are no longer operating. She inquired about [facility] , but they only see youth now. She states [facility] is not an option as bad experience in the past. Client denies SI, but does report increased anger problems.  PHQ9 on [facility] enrollment score of 25 Discussed that [facility] have a [place] now twice a week, but he is not able to prescribe medications.she reports being on medications in the past. Discussed with client of going to [facility] just for [place] as they have a psychiatrist.  She would like to be referred there for mental health only. We discussed that there is a sliding scale copayment and they determine that based on her financial documents. Will send client's [facility] documents to [facility] to request they contact client for Mental health services only. Client is agreeable.   Client is not currently working, receiving workers comp.per patient She lives with finace' Food: client is aware of [facility] and has shopped there on enrollment. Transportation: no current needs.  Will plan to follow up with client after her 08/25/21 appt.  Client is agreeable.  [place] [employer]

## 2021-08-22 NOTE — Telephone Encounter (Signed)
Attempted to call client of [facility] for follow up and determine if [facility] appointment is needed.  No answer and no voicemail set up. Sent text to number requesting return call to [facility].  Debria Garret RN [facility]

## 2021-08-25 ENCOUNTER — Other Ambulatory Visit: Payer: Self-pay

## 2021-08-25 ENCOUNTER — Other Ambulatory Visit: Payer: Self-pay | Admitting: Physician Assistant

## 2021-08-25 ENCOUNTER — Encounter: Payer: Self-pay | Admitting: Physician Assistant

## 2021-08-25 ENCOUNTER — Ambulatory Visit: Payer: Self-pay | Admitting: Physician Assistant

## 2021-08-25 VITALS — BP 139/89 | HR 70 | Temp 98.0°F | Ht 66.5 in | Wt 203.5 lb

## 2021-08-25 DIAGNOSIS — K219 Gastro-esophageal reflux disease without esophagitis: Secondary | ICD-10-CM

## 2021-08-25 DIAGNOSIS — Z1211 Encounter for screening for malignant neoplasm of colon: Secondary | ICD-10-CM

## 2021-08-25 DIAGNOSIS — F17200 Nicotine dependence, unspecified, uncomplicated: Secondary | ICD-10-CM

## 2021-08-25 DIAGNOSIS — I10 Essential (primary) hypertension: Secondary | ICD-10-CM

## 2021-08-25 DIAGNOSIS — E785 Hyperlipidemia, unspecified: Secondary | ICD-10-CM

## 2021-08-25 DIAGNOSIS — Z23 Encounter for immunization: Secondary | ICD-10-CM

## 2021-08-25 DIAGNOSIS — E118 Type 2 diabetes mellitus with unspecified complications: Secondary | ICD-10-CM

## 2021-08-25 MED ORDER — METFORMIN HCL 1000 MG PO TABS: 1000.0000 mg | ORAL_TABLET | Freq: Two times a day (BID) | ORAL | 1 refills | Status: DC

## 2021-08-25 MED ORDER — METOPROLOL TARTRATE 50 MG PO TABS: 50.0000 mg | ORAL_TABLET | Freq: Two times a day (BID) | ORAL | 0 refills | Status: DC

## 2021-08-25 MED ORDER — OMEPRAZOLE 40 MG PO CPDR: DELAYED_RELEASE_CAPSULE | ORAL | 1 refills | Status: DC

## 2021-08-25 MED ORDER — ATORVASTATIN CALCIUM 20 MG PO TABS: ORAL_TABLET | ORAL | 0 refills | Status: DC

## 2021-08-25 MED ORDER — LISINOPRIL 10 MG PO TABS: ORAL_TABLET | ORAL | 1 refills | Status: DC

## 2021-08-25 NOTE — Progress Notes (Signed)
BP 139/89    Pulse 70    Temp 98 F (36.7 C)    Ht 5' 6.5" (1.689 m)    Wt 203 lb 8 oz (92.3 kg)    SpO2 96%    BMI 32.35 kg/m    Subjective:    Patient ID: Shari Collins, female    DOB: Jan 15, 1976, 45 y.o.   MRN: [id number]  HPI: Shari Collins is a 45 y.o. female presenting on 08/25/2021 for New Patient (Initial Visit)   HPI   Chief Complaint  Patient presents with   New Patient (Initial Visit)    Pt is 59yoF with DM, HTN and dyslipidemia who presents to re-establish care.  She was last sseen here 03/25/21 (sick visit) and on 5/31 for her DM.  She moved away but came back.   She had cervical fusion surgery end of august.  She had follow up with Neurosurgery on  12/5.  Pt is not working now.  She says neurosurgeon told her she needed physical therapy.  She has no new complaints.  She does admit to having a lot of anger.  She denies SI, HI.   She has not gotten the covid vaccination.     Relevant past medical, surgical, family and social history reviewed and updated as indicated. Interim medical history since our last visit reviewed. Allergies and medications reviewed and updated.   Current Outpatient Medications:    HYDROcodone-acetaminophen (NORCO/VICODIN) 5-325 MG tablet, Take 1 tablet by mouth every 4 (four) hours as needed for moderate pain., Disp: 20 tablet, Rfl: 0   insulin glargine (LANTUS SOLOSTAR) 100 UNIT/ML Solostar Pen, Inject 35 Units into the skin at bedtime., Disp: 15 mL, Rfl:    metFORMIN (GLUCOPHAGE) 1000 MG tablet, TAKE 1 Tablet  BY MOUTH TWICE DAILY WITH A MEAL, Disp: 180 tablet, Rfl: 1   methocarbamol (ROBAXIN) 500 MG tablet, Take 1 tablet (500 mg total) by mouth 4 (four) times daily., Disp: 90 tablet, Rfl: 0   metoprolol tartrate (LOPRESSOR) 25 MG tablet, Take 25 mg by mouth daily., Disp: , Rfl:    omeprazole (PRILOSEC) 40 MG capsule, TAKE 1 Capsule BY MOUTH ONCE EVERY DAY (Patient taking differently: Take 40 mg by mouth in the morning and at bedtime.),  Disp: 90 capsule, Rfl: 1   triamcinolone cream (KENALOG) 0.1 %, APPLY TO THE AFFECTED AREA TWICE DAILY AS NEEDED, Disp: 60 g, Rfl: 1   atorvastatin (LIPITOR) 20 MG tablet, TAKE 1 Tablet BY MOUTH ONCE EVERY DAY (Patient not taking: Reported on 08/25/2021), Disp: 90 tablet, Rfl: 1   cephALEXin (KEFLEX) 500 MG capsule, Take 1 capsule (500 mg total) by mouth 4 (four) times daily. (Patient not taking: Reported on 04/23/2021), Disp: 28 capsule, Rfl: 0   diphenhydrAMINE (BENADRYL) 25 MG tablet, Take 25 mg by mouth daily as needed for allergies. (Patient not taking: Reported on 08/25/2021), Disp: , Rfl:    fenofibrate (TRICOR) 145 MG tablet, Take 1 tablet (145 mg total) by mouth daily. (Patient not taking: Reported on 04/23/2021), Disp: 30 tablet, Rfl: 4   lisinopril (ZESTRIL) 10 MG tablet, TAKE 1 Tablet BY MOUTH ONCE EVERY DAY (Patient not taking: Reported on 08/25/2021), Disp: 90 tablet, Rfl: 1   metoprolol tartrate (LOPRESSOR) 50 MG tablet, TAKE 1 Tablet  BY MOUTH TWICE DAILY (Patient not taking: Reported on 08/25/2021), Disp: 180 tablet, Rfl: 0   naproxen (NAPROSYN) 500 MG tablet, Take 1 tablet (500 mg total) by mouth 2 (two) times daily. (Patient not taking:  Reported on 04/23/2021), Disp: 20 tablet, Rfl: 0   PROVENTIL HFA 108 (90 Base) MCG/ACT inhaler, INHALE 2 PUFFS BY MOUTH EVERY 6 HOURS AS NEEDED FOR COUGHING, WHEEZING, OR SHORTNESS OF BREATH (Patient not taking: Reported on 08/25/2021), Disp: 20.1 g, Rfl: 1     Review of Systems  Per HPI unless specifically indicated above     Objective:    BP 139/89    Pulse 70    Temp 98 F (36.7 C)    Ht 5' 6.5" (1.689 m)    Wt 203 lb 8 oz (92.3 kg)    SpO2 96%    BMI 32.35 kg/m   Wt Readings from Last 3 Encounters:  08/25/21 203 lb 8 oz (92.3 kg)  05/06/21 193 lb (87.5 kg)  04/24/21 193 lb 6 oz (87.7 kg)    Physical Exam Vitals reviewed.  Constitutional:      General: She is not in acute distress.    Appearance: She is well-developed. She is not  toxic-appearing.  HENT:     Head: Normocephalic and atraumatic.     Right Ear: Tympanic membrane, ear canal and external ear normal.     Left Ear: Tympanic membrane, ear canal and external ear normal.  Eyes:     Extraocular Movements: Extraocular movements intact.     Conjunctiva/sclera: Conjunctivae normal.     Pupils: Pupils are equal, round, and reactive to light.  Neck:     Thyroid: No thyromegaly.  Cardiovascular:     Rate and Rhythm: Normal rate and regular rhythm.  Pulmonary:     Effort: Pulmonary effort is normal.     Breath sounds: Normal breath sounds.  Abdominal:     General: Bowel sounds are normal.     Palpations: Abdomen is soft. There is no mass.     Tenderness: There is no abdominal tenderness.  Musculoskeletal:     Cervical back: Neck supple.     Right lower leg: No edema.     Left lower leg: No edema.  Lymphadenopathy:     Cervical: No cervical adenopathy.  Skin:    General: Skin is warm and dry.  Neurological:     Mental Status: She is alert and oriented to person, place, and time.     Motor: No weakness or tremor.     Gait: Gait normal.  Psychiatric:        Attention and Perception: Attention normal.        Speech: Speech normal.        Behavior: Behavior normal. Behavior is cooperative.        Assessment & Plan:   Encounter Diagnoses  Name Primary?   Controlled diabetes mellitus type 2 with complications, unspecified whether long term insulin use (HCC) Yes   Gastroesophageal reflux disease, unspecified whether esophagitis present    Influenza vaccination administered at current visit    Essential hypertension    Hyperlipidemia, unspecified hyperlipidemia type    Tobacco use disorder    Screening for colon cancer      -[facility] appt is scheduled for tomorrow -will Update labs -Meds rx sent to [place].  Pt to call [facility] to update address.  Pt says she doesn't need refill albuterool mdi -pt is given FIT test for colon cancer  screening -Will look at blood labs before ordering fenofirbrate -pt got flu shot today in office -pt was educated and encouraged to get covid vaccination -pt will follow up in office in 3 months.  She is to contact  office sooner prn

## 2021-08-25 NOTE — Patient Instructions (Addendum)
[facility]  [phone]  -------------------------------------------  Influenza (Flu) Vaccine (Inactivated or Recombinant): What You Need to Know 1. Why get vaccinated? Influenza vaccine can prevent influenza (flu). Flu is a contagious disease that spreads around the [place] every year, usually between October and May. Anyone can get the flu, but it is more dangerous for some people. Infants and young children, people 4 years and older, pregnant people, and people with certain health conditions or a weakened immune system are at greatest risk of flu complications. Pneumonia, bronchitis, sinus infections, and ear infections are examples of flu-related complications. If you have a medical condition, such as heart disease, cancer, or diabetes, flu can make it worse. Flu can cause fever and chills, sore throat, muscle aches, fatigue, cough, headache, and runny or stuffy nose. Some people may have vomiting and diarrhea, though this is more common in children than adults. In an average year, thousands of people in the [place] die from flu, and many more are hospitalized. Flu vaccine prevents millions of illnesses and flu-related visits to the doctor each year. 2. Influenza vaccines CDC recommends everyone 6 months and older get vaccinated every flu season. Children 6 months through 1 years of age may need 2 doses during a single flu season. Everyone else needs only 1 dose each flu season. It takes about 2 weeks for protection to develop after vaccination. There are many flu viruses, and they are always changing. Each year a new flu vaccine is made to protect against the influenza viruses believed to be likely to cause disease in the upcoming flu season. Even when the vaccine doesn't exactly match these viruses, it may still provide some protection. Influenza vaccine does not cause flu. Influenza vaccine may be given at the same time as other vaccines. 3. Talk with your health care  provider Tell your vaccination provider if the person getting the vaccine: Has had an allergic reaction after a previous dose of influenza vaccine, or has any severe, life-threatening allergies Has ever had Guillain-Barr Syndrome (also called "GBS") In some cases, your health care provider may decide to postpone influenza vaccination until a future visit. Influenza vaccine can be administered at any time during pregnancy. People who are or will be pregnant during influenza season should receive inactivated influenza vaccine. People with minor illnesses, such as a cold, may be vaccinated. People who are moderately or severely ill should usually wait until they recover before getting influenza vaccine. Your health care provider can give you more information. 4. Risks of a vaccine reaction Soreness, redness, and swelling where the shot is given, fever, muscle aches, and headache can happen after influenza vaccination. There may be a very small increased risk of Guillain-Barr Syndrome (GBS) after inactivated influenza vaccine (the flu shot). Young children who get the flu shot along with pneumococcal vaccine (PCV13) and/or DTaP vaccine at the same time might be slightly more likely to have a seizure caused by fever. Tell your health care provider if a child who is getting flu vaccine has ever had a seizure. People sometimes faint after medical procedures, including vaccination. Tell your provider if you feel dizzy or have vision changes or ringing in the ears. As with any medicine, there is a very remote chance of a vaccine causing a severe allergic reaction, other serious injury, or death. 5. What if there is a serious problem? An allergic reaction could occur after the vaccinated person leaves the clinic. If you see signs of a severe allergic reaction (hives, swelling of  the face and throat, difficulty breathing, a fast heartbeat, dizziness, or weakness), call 9-1-1 and get the person to the nearest  hospital. For other signs that concern you, call your health care provider. Adverse reactions should be reported to the Vaccine Adverse Event Reporting System (VAERS). Your health care provider will usually file this report, or you can do it yourself. Visit the VAERS website at www.vaers.SamedayNews.es or call 684-063-2239. VAERS is only for reporting reactions, and VAERS staff members do not give medical advice. 6. The National Vaccine Injury Compensation Program The [employer] Vaccine Injury Compensation Program (VICP) is a federal program that was created to compensate people who may have been injured by certain vaccines. Claims regarding alleged injury or death due to vaccination have a time limit for filing, which may be as short as two years. Visit the VICP website at GoldCloset.com.ee or call 778-289-2863 to learn about the program and about filing a claim. 7. How can I learn more? Ask your health care provider. Call your local or state health department. Visit the website of the Food and Drug Administration (FDA) for vaccine package inserts and additional information at TraderRating.uy. Contact the Centers for Disease Control and Prevention (CDC): Call 907-550-1060 (1-800-CDC-INFO) or Visit CDC's website at https://gibson.com/. Vaccine Information Statement Inactivated Influenza Vaccine (04/12/2020) This information is not intended to replace advice given to you by your health care provider. Make sure you discuss any questions you have with your health care provider. Document Revised: 05/15/2021 Document Reviewed: 05/15/2021 Elsevier Patient Education  2022 [employer].

## 2021-08-26 ENCOUNTER — Ambulatory Visit: Payer: Self-pay | Admitting: Licensed Clinical Social Worker

## 2021-08-26 ENCOUNTER — Other Ambulatory Visit (HOSPITAL_COMMUNITY)
Admission: RE | Admit: 2021-08-26 | Discharge: 2021-08-26 | Disposition: A | Discharge status: Home / Self Care | Payer: Self-pay | Source: Ambulatory Visit | Attending: Physician Assistant | Admitting: Physician Assistant

## 2021-08-26 ENCOUNTER — Other Ambulatory Visit: Payer: Self-pay

## 2021-08-26 ENCOUNTER — Other Ambulatory Visit: Payer: Self-pay | Admitting: Physician Assistant

## 2021-08-26 DIAGNOSIS — Z1211 Encounter for screening for malignant neoplasm of colon: Secondary | ICD-10-CM

## 2021-08-26 DIAGNOSIS — E118 Type 2 diabetes mellitus with unspecified complications: Secondary | ICD-10-CM | POA: Insufficient documentation

## 2021-08-26 DIAGNOSIS — E785 Hyperlipidemia, unspecified: Secondary | ICD-10-CM | POA: Insufficient documentation

## 2021-08-26 DIAGNOSIS — F32A Depression, unspecified: Secondary | ICD-10-CM

## 2021-08-26 DIAGNOSIS — I10 Essential (primary) hypertension: Secondary | ICD-10-CM | POA: Insufficient documentation

## 2021-08-26 LAB — COMPREHENSIVE METABOLIC PANEL
ALT: 13 U/L (ref 0–44)
AST: 15 U/L (ref 15–41)
Albumin: 3.8 g/dL (ref 3.5–5.0)
Alkaline Phosphatase: 62 U/L (ref 38–126)
Anion gap: 7 (ref 5–15)
BUN: 12 mg/dL (ref 6–20)
CO2: 27 mmol/L (ref 22–32)
Calcium: 8.5 mg/dL — ABNORMAL LOW (ref 8.9–10.3)
Chloride: 102 mmol/L (ref 98–111)
Creatinine, Ser: 0.54 mg/dL (ref 0.44–1.00)
GFR, Estimated: >60 mL/min (ref >60)
Glucose, Bld: 135 mg/dL — ABNORMAL HIGH (ref 70–99)
Potassium: 4.1 mmol/L (ref 3.5–5.1)
Sodium: 136 mmol/L (ref 135–145)
Total Bilirubin: 0.5 mg/dL (ref 0.3–1.2)
Total Protein: 7.2 g/dL (ref 6.5–8.1)

## 2021-08-26 LAB — LIPID PANEL
Cholesterol: 198 mg/dL (ref 0–200)
HDL: 42 mg/dL (ref >40)
LDL Cholesterol: 123 mg/dL — ABNORMAL HIGH (ref 0–99)
Total CHOL/HDL Ratio: 4.7 RATIO
Triglycerides: 166 mg/dL — ABNORMAL HIGH (ref <150)
VLDL: 33 mg/dL (ref 0–40)

## 2021-08-26 LAB — HEMOGLOBIN A1C
Hgb A1c MFr Bld: 7.3 % — ABNORMAL HIGH (ref 4.8–5.6)
Mean Plasma Glucose: 162.81 mg/dL

## 2021-08-26 LAB — IFOBT (OCCULT BLOOD): IFOBT: NEGATIVE

## 2021-08-26 NOTE — Progress Notes (Signed)
[facility] engaged patient in initial [facility] session. [facility] provided reflective listening and validation as patient shared about current depression and anger symptoms and past traumas. [facility] led patient in mindfulness exercise, encouraged patient to use to stabilize mood.

## 2021-08-27 LAB — MICROALBUMIN, URINE: Microalb, Ur: 10.8 ug/mL — ABNORMAL HIGH

## 2021-09-10 ENCOUNTER — Ambulatory Visit: Payer: Self-pay | Admitting: Licensed Clinical Social Worker

## 2021-09-17 ENCOUNTER — Ambulatory Visit: Payer: Self-pay | Admitting: Licensed Clinical Social Worker

## 2021-11-11 ENCOUNTER — Other Ambulatory Visit: Payer: Self-pay | Admitting: Physician Assistant

## 2021-11-11 DIAGNOSIS — E118 Type 2 diabetes mellitus with unspecified complications: Secondary | ICD-10-CM

## 2021-11-11 DIAGNOSIS — I10 Essential (primary) hypertension: Secondary | ICD-10-CM

## 2021-11-11 DIAGNOSIS — E785 Hyperlipidemia, unspecified: Secondary | ICD-10-CM

## 2021-11-13 ENCOUNTER — Other Ambulatory Visit: Payer: Self-pay | Admitting: Physician Assistant

## 2021-11-13 MED ORDER — METFORMIN HCL 1000 MG PO TABS: 1000.0000 mg | ORAL_TABLET | Freq: Two times a day (BID) | ORAL | 1 refills | Status: DC

## 2021-11-24 ENCOUNTER — Other Ambulatory Visit: Payer: Self-pay | Admitting: Physician Assistant

## 2021-11-24 ENCOUNTER — Telehealth: Payer: Self-pay

## 2021-11-24 DIAGNOSIS — K219 Gastro-esophageal reflux disease without esophagitis: Secondary | ICD-10-CM

## 2021-11-24 MED ORDER — METFORMIN HCL 1000 MG PO TABS: 1000.0000 mg | ORAL_TABLET | Freq: Two times a day (BID) | ORAL | 0 refills | Status: DC

## 2021-11-24 MED ORDER — LISINOPRIL 10 MG PO TABS: ORAL_TABLET | ORAL | 0 refills | Status: DC

## 2021-11-24 MED ORDER — OMEPRAZOLE 40 MG PO CPDR: DELAYED_RELEASE_CAPSULE | ORAL | 0 refills | Status: DC

## 2021-11-24 MED ORDER — ATORVASTATIN CALCIUM 20 MG PO TABS: ORAL_TABLET | ORAL | 0 refills | Status: DC

## 2021-11-24 MED ORDER — METOPROLOL TARTRATE 50 MG PO TABS: 50.0000 mg | ORAL_TABLET | Freq: Two times a day (BID) | ORAL | 0 refills | Status: DC

## 2021-11-24 NOTE — Telephone Encounter (Signed)
Spoke with Education officer, museum at [employer] to inform pt has insurance now. ?

## 2021-12-01 ENCOUNTER — Ambulatory Visit: Payer: Self-pay | Admitting: Physician Assistant

## 2021-12-25 ENCOUNTER — Other Ambulatory Visit: Payer: Self-pay | Admitting: Physician Assistant

## 2021-12-25 MED ORDER — METFORMIN HCL 1000 MG PO TABS: 1000.0000 mg | ORAL_TABLET | Freq: Two times a day (BID) | ORAL | 0 refills | Status: DC

## 2021-12-30 ENCOUNTER — Ambulatory Visit: Payer: Self-pay | Admitting: Family Medicine

## 2022-01-06 ENCOUNTER — Encounter: Payer: Self-pay | Admitting: Family Medicine

## 2022-01-06 ENCOUNTER — Ambulatory Visit (INDEPENDENT_AMBULATORY_CARE_PROVIDER_SITE_OTHER): Payer: Medicaid Other | Admitting: Family Medicine

## 2022-01-06 VITALS — BP 118/82 | HR 61 | Ht 66.0 in | Wt 195.2 lb

## 2022-01-06 DIAGNOSIS — K219 Gastro-esophageal reflux disease without esophagitis: Secondary | ICD-10-CM

## 2022-01-06 DIAGNOSIS — R7301 Impaired fasting glucose: Secondary | ICD-10-CM

## 2022-01-06 DIAGNOSIS — E1165 Type 2 diabetes mellitus with hyperglycemia: Secondary | ICD-10-CM | POA: Insufficient documentation

## 2022-01-06 DIAGNOSIS — N3090 Cystitis, unspecified without hematuria: Secondary | ICD-10-CM

## 2022-01-06 DIAGNOSIS — M5412 Radiculopathy, cervical region: Secondary | ICD-10-CM

## 2022-01-06 DIAGNOSIS — E559 Vitamin D deficiency, unspecified: Secondary | ICD-10-CM

## 2022-01-06 DIAGNOSIS — E785 Hyperlipidemia, unspecified: Secondary | ICD-10-CM

## 2022-01-06 DIAGNOSIS — I10 Essential (primary) hypertension: Secondary | ICD-10-CM

## 2022-01-06 DIAGNOSIS — R300 Dysuria: Secondary | ICD-10-CM

## 2022-01-06 DIAGNOSIS — Z1211 Encounter for screening for malignant neoplasm of colon: Secondary | ICD-10-CM

## 2022-01-06 LAB — POCT URINALYSIS DIP (CLINITEK)
Bilirubin, UA: NEGATIVE
Blood, UA: NEGATIVE
Glucose, UA: 100 mg/dL — AB
Nitrite, UA: POSITIVE — AB
Spec Grav, UA: 1.02 (ref 1.010–1.025)
Urobilinogen, UA: 1 E.U./dL
pH, UA: 6.5 (ref 5.0–8.0)

## 2022-01-06 LAB — GLUCOSE, POCT (MANUAL RESULT ENTRY): POC Glucose: 159 mg/dl — AB (ref 70–99)

## 2022-01-06 MED ORDER — METHOCARBAMOL 500 MG PO TABS: 500.0000 mg | ORAL_TABLET | Freq: Four times a day (QID) | ORAL | 0 refills | Status: DC

## 2022-01-06 MED ORDER — FENOFIBRATE 145 MG PO TABS: 145.0000 mg | ORAL_TABLET | Freq: Every day | ORAL | 4 refills | Status: DC

## 2022-01-06 MED ORDER — METFORMIN HCL 1000 MG PO TABS: 1000.0000 mg | ORAL_TABLET | Freq: Two times a day (BID) | ORAL | 0 refills | Status: DC

## 2022-01-06 MED ORDER — ATORVASTATIN CALCIUM 20 MG PO TABS: ORAL_TABLET | ORAL | 0 refills | Status: DC

## 2022-01-06 MED ORDER — OMEPRAZOLE 40 MG PO CPDR: DELAYED_RELEASE_CAPSULE | ORAL | 0 refills | Status: DC

## 2022-01-06 MED ORDER — NITROFURANTOIN MONOHYD MACRO 100 MG PO CAPS: 100.0000 mg | ORAL_CAPSULE | Freq: Two times a day (BID) | ORAL | 0 refills | Status: AC

## 2022-01-06 MED ORDER — LISINOPRIL 10 MG PO TABS: ORAL_TABLET | ORAL | 0 refills | Status: DC

## 2022-01-06 MED ORDER — BLOOD GLUCOSE METER KIT: PACK | 0 refills | Status: DC

## 2022-01-06 NOTE — Assessment & Plan Note (Signed)
Medications refilled

## 2022-01-06 NOTE — Assessment & Plan Note (Signed)
-  Blood glucose today- 159 ?-Patient admitted to taking her metformin this morning ?Last HgA1c on 08/26/21 was 7.3 ?-Pending HgA1c today ?-Will consider starting trulicity or mounjaro if HgA1c is <10 and stopping/weaning the patient off insulin. ?

## 2022-01-06 NOTE — Patient Instructions (Signed)
I appreciate the opportunity to provide care to you today! ? ?Please stop by the lab to have your blood drawn ?Please pick up your antibiotics and refills at your pharmacy. ? ?Follow-up: 3 months ? ?Please complete the full course of the antibiotics  ? ?You can help prevent UTIs by doing the following: ? ?-Avoid holding urine for prolonged periods; this stretches the bladder and causes bacteria to form because bacteria like warm and wet environments to grow ?-Empty the bladder as soon as the need arises.  ?-Empty your bladder soon after intercourse.  ?-Take showers instead of baths ?-Wipe front to back; doing so after urinating and after a bowel movement helps prevent bacteria in the anal region from spreading to the vagina and urethra. ?-Also, drink a full glass of water to help flush bacteria.  ? ? - Please implement a heart-healthy diet and exercise for 30 mins, 3 times daily.  ?  ?It was a pleasure to see you and I look forward to continuing to work together on your health and well-being. ?Please do not hesitate to call the office if you need care or have questions about your care. ?  ?Have a wonderful day and week. ?With Gratitude, ?Alvira Monday MSN, FNP-BC  ?

## 2022-01-06 NOTE — Assessment & Plan Note (Signed)
-  Blood glucose today- 159 ?-Patient admitted to taking her metformin this morning ?Last HgA1c on 08/26/21 was 7.3 ?-Pending HgA1c today ?-Will consider starting trulicity or mounjaro if HgA1c is <10 and stopping/weaning the patient off insulin. ?  ? ?

## 2022-01-06 NOTE — Assessment & Plan Note (Signed)
-  Urine dipstick positive for leukocytes and nitrates ?-Pending culture ?-Macrobid ordered ? ?-Educated to Please complete the full course of the antibiotics  ? ?You can help prevent UTIs by doing the following: ? ?-Avoid holding urine for prolonged periods; this stretches the bladder and causes bacteria to form because bacteria like warm and wet environments to grow ?-Empty the bladder as soon as the need arises.  ?-Empty your bladder soon after intercourse.  ?-Take showers instead of baths ?-Wipe front to back; doing so after urinating and after a bowel movement helps prevent bacteria in the anal region from spreading to the vagina and urethra. ?-Also, drink a full glass of water to help flush bacteria. ?

## 2022-01-06 NOTE — Progress Notes (Signed)
marc ? ?New Patient Office Visit ? ?Subjective:  ?Patient ID: Shari Collins, female    DOB: 1976-05-22  Age: 46 y.o. MRN: [id number] ? ?CC:  ?Chief Complaint  ?Patient presents with  ? New Patient (Initial Visit)  ?  Pt was treated at [facility] for Primary care previously. Pt complains of burning while urinating onset 10/13/21 on and off.   ? ? ?HPI ?Shari Collins is a 46 y.o. female with past medical history of uncontrolled T2DM, Hyperlipidemia, and GERD presents for establishing care.  ? ?Urinary Symptoms: she reports urinary symptoms of urgency, frequency, and pain with urination intermittently for two weeks. She noted fever, nausea, and abdominal pain. She denies hematuria and urinary odor. ? ? T2DM: she reports not taking Lantus for the past week, stating that the medication is not helping to lower her blood glucose with elevated glucose in the moring. She says that metformin helps her more than the lantus. The highest blood sugar was reported between 150-230, and the lowest reading was 87. She admits to minimal exercise. ? ? She reports that she was screened for hep c and HIV [facility]. ? ?Past Medical History:  ?Diagnosis Date  ? Anxiety   ? pt states she was diagnosed years ago  ? Asthma   ? Bipolar disorder ([place])   ? pt states she was diagnosed years ago  ? Depression   ? Diabetes mellitus   ? Dysrhythmia   ? per patient; however cardiac monitor December 2021 without atrial fibrillation. She had isolated PACs and couplets, rare episodes of SVT consistent with atrial tachycardia.  Rare ventricular ectopy with isolated PVCs and couplets.  ? Hypercholesteremia   ? Hypertension   ? Noncompliance   ? Schizophrenia ([place])   ? pt states she was diagnosed years ago  ? ? ?Past Surgical History:  ?Procedure Laterality Date  ? ANTERIOR CERVICAL DECOMP/DISCECTOMY FUSION N/A 05/06/2021  ? Procedure: ANTERIOR CERVICAL DECOMPRESSION FUSION CERVICAL FOUR-FIVE, CERVICAL FIVE-SIX;  Surgeon: Dawley, Theodoro Doing, DO;   Location: [place];  Service: Neurosurgery;  Laterality: N/A;  ? CESAREAN SECTION    ? DILATION AND CURETTAGE OF UTERUS  1994  ? 1994 and 2007  ? ESOPHAGOGASTRODUODENOSCOPY (EGD) WITH PROPOFOL N/A 01/30/2021  ? Procedure: ESOPHAGOGASTRODUODENOSCOPY (EGD) WITH PROPOFOL;  Surgeon: Daneil Dolin, MD;  [facility];  Service: Endoscopy;  Laterality: N/A;  12:30pm  ? MALONEY DILATION N/A 01/30/2021  ? Procedure: MALONEY DILATION;  Surgeon: Daneil Dolin, MD;  [facility];  Service: Endoscopy;  Laterality: N/A;  ? TONSILLECTOMY    ? ? ?Family History  ?Problem Relation Age of Onset  ? Hypertension Paternal Grandmother   ? Hypertension Maternal Grandmother   ? Diabetes Father   ? Hypertension Father   ? Cancer Mother   ?     liver  ? Heart attack Paternal Grandfather   ? Diabetes Brother   ? Colon cancer Neg Hx   ? ? ?Social History  ? ?Socioeconomic History  ? Marital status: Single  ?  Spouse name: Not on file  ? Number of children: Not on file  ? Years of education: Not on file  ? Highest education level: Not on file  ?Occupational History  ? Not on file  ?Tobacco Use  ? Smoking status: Every Day  ?  Packs/day: 0.50  ?  Years: 20.00  ?  Pack years: 10.00  ?  Types: Cigarettes  ? Smokeless tobacco: Never  ?  Vaping Use  ? Vaping Use: Never used  ?Substance and Sexual Activity  ? Alcohol use: Not Currently  ? Drug use: No  ? Sexual activity: Yes  ?  Birth control/protection: None  ?Other Topics Concern  ? Not on file  ?Social History Narrative  ? Not on file  ? ?Social Determinants of Health  ? ?Financial Resource Strain: Not on file  ?Food Insecurity: Not on file  ?Transportation Needs: Not on file  ?Physical Activity: Not on file  ?Stress: Not on file  ?Social Connections: Not on file  ?Intimate Partner Violence: Not on file  ? ? ?ROS ?Review of Systems  ?Constitutional:  Positive for chills, fatigue and fever.  ?HENT:  Negative for sinus pressure, sinus pain and sneezing.   ?Eyes:  Negative for  pain, redness and itching.  ?Respiratory:  Negative for chest tightness, shortness of breath and wheezing.   ?Cardiovascular:  Positive for palpitations (has A-fib). Negative for chest pain.  ?Gastrointestinal:  Negative for diarrhea, nausea and vomiting.  ?Endocrine: Negative for polydipsia, polyphagia and polyuria.  ?Genitourinary:  Positive for dysuria, flank pain, frequency, pelvic pain and urgency. Negative for hematuria.  ?Musculoskeletal:  Negative for back pain and neck pain.  ?Skin:  Negative for rash and wound.  ?Neurological:  Negative for weakness and headaches.  ?Psychiatric/Behavioral:  Negative for suicidal ideas.   ? ?Objective:  ? ?Today's Vitals: BP 118/82 (BP Location: Left Arm, Patient Position: Sitting)   Pulse 61   Ht '5\' 6"'  (1.676 m)   Wt 195 lb 3.2 oz (88.5 kg)   SpO2 98%   BMI 31.51 kg/m?  ? ?Physical Exam ?Constitutional:   ?   Appearance: Normal appearance.  ?HENT:  ?   Head: Normocephalic.  ?   Right Ear: External ear normal.  ?   Left Ear: External ear normal.  ?   Nose: Nose normal. No congestion or rhinorrhea.  ?   Mouth/Throat:  ?   Mouth: Mucous membranes are moist.  ?   Pharynx: No oropharyngeal exudate.  ?Eyes:  ?   Extraocular Movements: Extraocular movements intact.  ?   Pupils: Pupils are equal, round, and reactive to light.  ?Cardiovascular:  ?   Rate and Rhythm: Normal rate and regular rhythm.  ?Pulmonary:  ?   Effort: Pulmonary effort is normal.  ?   Breath sounds: Normal breath sounds.  ?Abdominal:  ?   Tenderness: There is abdominal tenderness in the suprapubic area. There is no right CVA tenderness or left CVA tenderness.  ?Musculoskeletal:  ?   Cervical back: No rigidity or tenderness.  ?   Right lower leg: No edema.  ?   Left lower leg: No edema.  ?Skin: ?   General: Skin is warm.  ?Neurological:  ?   Mental Status: She is alert and oriented to person, place, and time.  ?Psychiatric:  ?   Comments: Normal affect  ? ? ?Assessment & Plan:  ? ?Problem List Items  Addressed This Visit   ? ?  ? Digestive  ? Gastroesophageal reflux disease  ?  Medications refilled ? ?  ?  ? Relevant Medications  ? omeprazole (PRILOSEC) 40 MG capsule  ?  ? Endocrine  ? Type 2 diabetes mellitus with hyperglycemia (HCC)  ?   -Blood glucose today- 159 ?-Patient admitted to taking her metformin this morning ?Last HgA1c on 08/26/21 was 7.3 ?-Pending HgA1c today ?-Will consider starting trulicity or mounjaro if HgA1c is <10 and stopping/weaning the patient off  insulin. ? ?  ?  ? Relevant Medications  ? atorvastatin (LIPITOR) 20 MG tablet  ? lisinopril (ZESTRIL) 10 MG tablet  ? metFORMIN (GLUCOPHAGE) 1000 MG tablet  ?  ? Nervous and Auditory  ? Cervical radiculopathy at C6  ? Relevant Medications  ? methocarbamol (ROBAXIN) 500 MG tablet  ?  ? Genitourinary  ? Cystitis  ?  -Urine dipstick positive for leukocytes and nitrates ?-Pending culture ?-Macrobid ordered ? ?-Educated to Please complete the full course of the antibiotics  ? ?You can help prevent UTIs by doing the following: ? ?-Avoid holding urine for prolonged periods; this stretches the bladder and causes bacteria to form because bacteria like warm and wet environments to grow ?-Empty the bladder as soon as the need arises.  ?-Empty your bladder soon after intercourse.  ?-Take showers instead of baths ?-Wipe front to back; doing so after urinating and after a bowel movement helps prevent bacteria in the anal region from spreading to the vagina and urethra. ?-Also, drink a full glass of water to help flush bacteria. ?  ?  ?  ? Other  ? Hyperlipidemia  ?  Medications refilled ? ?  ?  ? Relevant Medications  ? atorvastatin (LIPITOR) 20 MG tablet  ? fenofibrate (TRICOR) 145 MG tablet  ? lisinopril (ZESTRIL) 10 MG tablet  ? Other Relevant Orders  ? CBC with Differential/Platelet  ? CMP14+EGFR  ? TSH + free T4  ? Lipid panel  ? ?Other Visit Diagnoses   ? ? Burning with urination    -  Primary  ? Relevant Medications  ? nitrofurantoin,  macrocrystal-monohydrate, (MACROBID) 100 MG capsule  ? Other Relevant Orders  ? POCT URINALYSIS DIP (CLINITEK) (Completed)  ? Urine Culture  ? Vitamin D deficiency      ? Relevant Orders  ? Vitamin D (25 hydroxy)  ? IFG (impaired fas

## 2022-01-07 LAB — CMP14+EGFR
ALT: 7 IU/L (ref 0–32)
AST: 8 IU/L (ref 0–40)
Albumin/Globulin Ratio: 1.7 (ref 1.2–2.2)
Albumin: 4 g/dL (ref 3.8–4.8)
Alkaline Phosphatase: 82 IU/L (ref 44–121)
BUN/Creatinine Ratio: 17 (ref 9–23)
BUN: 12 mg/dL (ref 6–24)
Bilirubin Total: 0.3 mg/dL (ref 0.0–1.2)
CO2: 22 mmol/L (ref 20–29)
Calcium: 8.7 mg/dL (ref 8.7–10.2)
Chloride: 100 mmol/L (ref 96–106)
Creatinine, Ser: 0.69 mg/dL (ref 0.57–1.00)
Globulin, Total: 2.4 g/dL (ref 1.5–4.5)
Glucose: 141 mg/dL — ABNORMAL HIGH (ref 70–99)
Potassium: 4.8 mmol/L (ref 3.5–5.2)
Sodium: 135 mmol/L (ref 134–144)
Total Protein: 6.4 g/dL (ref 6.0–8.5)
eGFR: 109 mL/min/{1.73_m2} (ref >59)

## 2022-01-07 LAB — CBC WITH DIFFERENTIAL/PLATELET
Basophils Absolute: 0.1 10*3/uL (ref 0.0–0.2)
Basos: 1 %
EOS (ABSOLUTE): 1.3 10*3/uL — ABNORMAL HIGH (ref 0.0–0.4)
Eos: 13 %
Hematocrit: 36.8 % (ref 34.0–46.6)
Hemoglobin: 12.3 g/dL (ref 11.1–15.9)
Immature Grans (Abs): 0 10*3/uL (ref 0.0–0.1)
Immature Granulocytes: 0 %
Lymphocytes Absolute: 1.9 10*3/uL (ref 0.7–3.1)
Lymphs: 19 %
MCH: 28.4 pg (ref 26.6–33.0)
MCHC: 33.4 g/dL (ref 31.5–35.7)
MCV: 85 fL (ref 79–97)
Monocytes Absolute: 0.5 10*3/uL (ref 0.1–0.9)
Monocytes: 5 %
Neutrophils Absolute: 6.2 10*3/uL (ref 1.4–7.0)
Neutrophils: 62 %
Platelets: 395 10*3/uL (ref 150–450)
RBC: 4.33 x10E6/uL (ref 3.77–5.28)
RDW: 17.8 % — ABNORMAL HIGH (ref 11.7–15.4)
WBC: 10 10*3/uL (ref 3.4–10.8)

## 2022-01-07 LAB — LIPID PANEL
Chol/HDL Ratio: 4.5 ratio — ABNORMAL HIGH (ref 0.0–4.4)
Cholesterol, Total: 154 mg/dL (ref 100–199)
HDL: 34 mg/dL — ABNORMAL LOW (ref >39)
LDL Chol Calc (NIH): 57 mg/dL (ref 0–99)
Triglycerides: 413 mg/dL — ABNORMAL HIGH (ref 0–149)
VLDL Cholesterol Cal: 63 mg/dL — ABNORMAL HIGH (ref 5–40)

## 2022-01-07 LAB — TSH+FREE T4
Free T4: 1.23 ng/dL (ref 0.82–1.77)
TSH: 2.24 u[IU]/mL (ref 0.450–4.500)

## 2022-01-07 LAB — HEMOGLOBIN A1C
Est. average glucose Bld gHb Est-mCnc: 169 mg/dL
Hgb A1c MFr Bld: 7.5 % — ABNORMAL HIGH (ref 4.8–5.6)

## 2022-01-07 LAB — VITAMIN D 25 HYDROXY (VIT D DEFICIENCY, FRACTURES): Vit D, 25-Hydroxy: 9.1 ng/mL — ABNORMAL LOW (ref 30.0–100.0)

## 2022-01-09 ENCOUNTER — Encounter (HOSPITAL_COMMUNITY): Payer: Self-pay

## 2022-01-09 ENCOUNTER — Other Ambulatory Visit: Payer: Self-pay

## 2022-01-09 ENCOUNTER — Emergency Department (HOSPITAL_COMMUNITY)
Admission: EM | Admit: 2022-01-09 | Discharge: 2022-01-10 | Disposition: A | Discharge status: Home / Self Care | Payer: Medicaid Other | Attending: Emergency Medicine | Admitting: Emergency Medicine

## 2022-01-09 ENCOUNTER — Other Ambulatory Visit: Payer: Self-pay | Admitting: Family Medicine

## 2022-01-09 DIAGNOSIS — E119 Type 2 diabetes mellitus without complications: Secondary | ICD-10-CM | POA: Insufficient documentation

## 2022-01-09 DIAGNOSIS — S0990XA Unspecified injury of head, initial encounter: Secondary | ICD-10-CM | POA: Diagnosis not present

## 2022-01-09 DIAGNOSIS — M542 Cervicalgia: Secondary | ICD-10-CM | POA: Diagnosis not present

## 2022-01-09 DIAGNOSIS — J45909 Unspecified asthma, uncomplicated: Secondary | ICD-10-CM | POA: Diagnosis not present

## 2022-01-09 DIAGNOSIS — I10 Essential (primary) hypertension: Secondary | ICD-10-CM | POA: Insufficient documentation

## 2022-01-09 DIAGNOSIS — E569 Vitamin deficiency, unspecified: Secondary | ICD-10-CM

## 2022-01-09 DIAGNOSIS — F17210 Nicotine dependence, cigarettes, uncomplicated: Secondary | ICD-10-CM | POA: Insufficient documentation

## 2022-01-09 DIAGNOSIS — E1165 Type 2 diabetes mellitus with hyperglycemia: Secondary | ICD-10-CM

## 2022-01-09 LAB — URINE CULTURE: Organism ID, Bacteria: NO GROWTH

## 2022-01-09 MED ORDER — TRULICITY 0.75 MG/0.5ML ~~LOC~~ SOAJ
0.7500 mg | SUBCUTANEOUS | 12 refills | Status: DC
Start: 2022-01-09 — End: 2022-04-22

## 2022-01-09 MED ORDER — VITAMIN D (ERGOCALCIFEROL) 1.25 MG (50000 UNIT) PO CAPS: 50000.0000 [IU] | ORAL_CAPSULE | ORAL | 1 refills | Status: DC
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
## 2022-01-10 ENCOUNTER — Other Ambulatory Visit: Payer: Self-pay | Admitting: Family Medicine

## 2022-01-10 ENCOUNTER — Emergency Department (HOSPITAL_COMMUNITY): Payer: Medicaid Other

## 2022-01-10 DIAGNOSIS — E785 Hyperlipidemia, unspecified: Secondary | ICD-10-CM

## 2022-01-10 MED ORDER — ATORVASTATIN CALCIUM 20 MG PO TABS: ORAL_TABLET | ORAL | 2 refills | Status: DC

## 2022-01-10 MED ORDER — HYDROCODONE-ACETAMINOPHEN 5-325 MG PO TABS
1.0000 | ORAL_TABLET | Freq: Once | ORAL | Status: AC
  Administered 2022-01-10: 1 via ORAL
  Filled 2022-01-10: qty 1
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
## 2022-01-10 NOTE — Discharge Instructions (Signed)
You were evaluated in the Emergency Department and after careful evaluation, we did not find any emergent condition requiring admission or further testing in the hospital. ? ?Your exam/testing today is overall reassuring.  CT scans did not show any significant injuries or emergencies.  Recommend Tylenol or Motrin at home for discomfort. ? ?Please return to the Emergency Department if you experience any worsening of your condition.   Thank you for allowing [place] to be a part of your care. ?
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
## 2022-01-20 ENCOUNTER — Other Ambulatory Visit: Payer: Self-pay | Admitting: Family Medicine

## 2022-01-20 DIAGNOSIS — R7301 Impaired fasting glucose: Secondary | ICD-10-CM

## 2022-02-03 ENCOUNTER — Other Ambulatory Visit: Payer: Self-pay | Admitting: Family Medicine

## 2022-02-03 DIAGNOSIS — K219 Gastro-esophageal reflux disease without esophagitis: Secondary | ICD-10-CM

## 2022-02-03 DIAGNOSIS — R7301 Impaired fasting glucose: Secondary | ICD-10-CM

## 2022-02-03 DIAGNOSIS — I10 Essential (primary) hypertension: Secondary | ICD-10-CM

## 2022-02-03 MED ORDER — METFORMIN HCL 1000 MG PO TABS: 1000.0000 mg | ORAL_TABLET | Freq: Two times a day (BID) | ORAL | 0 refills | Status: DC

## 2022-02-03 MED ORDER — LISINOPRIL 10 MG PO TABS: ORAL_TABLET | ORAL | 0 refills | Status: DC

## 2022-02-03 MED ORDER — OMEPRAZOLE 40 MG PO CPDR: DELAYED_RELEASE_CAPSULE | ORAL | 0 refills | Status: DC

## 2022-02-09 ENCOUNTER — Other Ambulatory Visit: Payer: Self-pay | Admitting: Family Medicine

## 2022-02-09 DIAGNOSIS — K219 Gastro-esophageal reflux disease without esophagitis: Secondary | ICD-10-CM

## 2022-03-16 ENCOUNTER — Other Ambulatory Visit: Payer: Self-pay | Admitting: Family Medicine

## 2022-03-16 DIAGNOSIS — R7301 Impaired fasting glucose: Secondary | ICD-10-CM

## 2022-03-16 DIAGNOSIS — K219 Gastro-esophageal reflux disease without esophagitis: Secondary | ICD-10-CM

## 2022-03-16 DIAGNOSIS — I10 Essential (primary) hypertension: Secondary | ICD-10-CM

## 2022-04-08 ENCOUNTER — Ambulatory Visit: Payer: Medicaid Other | Admitting: Family Medicine

## 2022-04-22 ENCOUNTER — Encounter: Payer: Self-pay | Admitting: Family Medicine

## 2022-04-22 ENCOUNTER — Ambulatory Visit (INDEPENDENT_AMBULATORY_CARE_PROVIDER_SITE_OTHER): Payer: Medicaid Other | Admitting: Family Medicine

## 2022-04-22 VITALS — BP 138/88 | HR 93 | Ht 66.0 in | Wt 190.0 lb

## 2022-04-22 DIAGNOSIS — R399 Unspecified symptoms and signs involving the genitourinary system: Secondary | ICD-10-CM | POA: Diagnosis not present

## 2022-04-22 DIAGNOSIS — E785 Hyperlipidemia, unspecified: Secondary | ICD-10-CM

## 2022-04-22 DIAGNOSIS — F419 Anxiety disorder, unspecified: Secondary | ICD-10-CM

## 2022-04-22 DIAGNOSIS — E1165 Type 2 diabetes mellitus with hyperglycemia: Secondary | ICD-10-CM | POA: Diagnosis not present

## 2022-04-22 DIAGNOSIS — F32A Depression, unspecified: Secondary | ICD-10-CM

## 2022-04-22 DIAGNOSIS — E559 Vitamin D deficiency, unspecified: Secondary | ICD-10-CM | POA: Diagnosis not present

## 2022-04-22 DIAGNOSIS — N3090 Cystitis, unspecified without hematuria: Secondary | ICD-10-CM

## 2022-04-22 DIAGNOSIS — Z1211 Encounter for screening for malignant neoplasm of colon: Secondary | ICD-10-CM | POA: Diagnosis not present

## 2022-04-22 DIAGNOSIS — I10 Essential (primary) hypertension: Secondary | ICD-10-CM

## 2022-04-22 DIAGNOSIS — E569 Vitamin deficiency, unspecified: Secondary | ICD-10-CM

## 2022-04-22 DIAGNOSIS — R7301 Impaired fasting glucose: Secondary | ICD-10-CM

## 2022-04-22 DIAGNOSIS — F3181 Bipolar II disorder: Secondary | ICD-10-CM

## 2022-04-22 DIAGNOSIS — F3289 Other specified depressive episodes: Secondary | ICD-10-CM

## 2022-04-22 LAB — POCT URINALYSIS DIP (CLINITEK)
Bilirubin, UA: NEGATIVE
Blood, UA: NEGATIVE
Glucose, UA: NEGATIVE mg/dL
Ketones, POC UA: NEGATIVE mg/dL
Nitrite, UA: POSITIVE — AB
POC PROTEIN,UA: NEGATIVE
Spec Grav, UA: 1.015 (ref 1.010–1.025)
Urobilinogen, UA: 0.2 E.U./dL
pH, UA: 6 (ref 5.0–8.0)

## 2022-04-22 MED ORDER — CITALOPRAM HYDROBROMIDE 40 MG PO TABS: 40.0000 mg | ORAL_TABLET | Freq: Every day | ORAL | 1 refills | Status: DC

## 2022-04-22 MED ORDER — TRULICITY 1.5 MG/0.5ML ~~LOC~~ SOAJ: SUBCUTANEOUS | 0 refills | Status: AC

## 2022-04-22 MED ORDER — VITAMIN D (ERGOCALCIFEROL) 1.25 MG (50000 UNIT) PO CAPS: 50000.0000 [IU] | ORAL_CAPSULE | ORAL | 1 refills | Status: DC

## 2022-04-22 MED ORDER — METFORMIN HCL 1000 MG PO TABS: 1000.0000 mg | ORAL_TABLET | Freq: Two times a day (BID) | ORAL | 2 refills | Status: DC

## 2022-04-22 MED ORDER — FENOFIBRATE 145 MG PO TABS: 145.0000 mg | ORAL_TABLET | Freq: Every day | ORAL | 2 refills | Status: DC

## 2022-04-22 MED ORDER — ATORVASTATIN CALCIUM 20 MG PO TABS: ORAL_TABLET | ORAL | 2 refills | Status: DC

## 2022-04-22 MED ORDER — LISINOPRIL 10 MG PO TABS: ORAL_TABLET | ORAL | 2 refills | Status: DC

## 2022-04-22 MED ORDER — NITROFURANTOIN MONOHYD MACRO 100 MG PO CAPS: 100.0000 mg | ORAL_CAPSULE | Freq: Two times a day (BID) | ORAL | 0 refills | Status: AC

## 2022-04-22 NOTE — Patient Instructions (Addendum)
I appreciate the opportunity to provide care to you today!    Follow up:  3 months  Labs: please stop by the lab during the week to get your blood drawn (CBC, CMP, TSH, Lipid profile, HgA1c, Vit D)  Please pick up your medications at the pharmacy   Referrals today- Psychiatry   Please continue to a heart-healthy diet and increase your physical activities. Try to exercise for 43mns at least three times a week.      It was a pleasure to see you and I look forward to continuing to work together on your health and well-being. Please do not hesitate to call the office if you need care or have questions about your care.   Have a wonderful day and week. With Gratitude, GAlvira MondayMSN, FNP-BC

## 2022-04-22 NOTE — Progress Notes (Signed)
Established Patient Office Visit  Subjective:  Patient ID: Shari Collins, female    DOB: 1976/04/12  Age: 46 y.o. MRN: [id number]  CC:  Chief Complaint  Patient presents with   Follow-up    Pt following up states she is doing better with new insulin medication, has been checking blood glucose at home, has had readings between 120-160. Would like to discuss behavioral health, used to be on celexa, klonopin, risperdal, and trazadone.    HPI Shari Collins is a 46 y.o. female with past medical history of T2DM presents for f/u of  chronic medical conditions. T2DM: reports doing well with trulicity and reports taking metformin.  The highest blood sugar is 280, and the lowest blood sugar is 80. She denies the 3ps of diabteses. She checks her blood sugar 3-4 times daily. Dyslipidemia: she reports being out of her cholesterol medication (Lipitor) for 3 weeks. She reports only getting the drugs she can afford since she is now being charged for her medicines.  UTI: c/o of urgency, frequency, and low back pain. She denies fever and chills. The onset of symptoms was one day ago.  Bipolar 2: reports being out of her medications (Risperdal) for >6 months due to medication cost and not following up with her psychiatrist like she used to.  Depression and anxiety: reports taking Celexa and Klonopine but has been out of her medications for > 6 months. She reports her psychiatrist prescribed her medications.    Past Medical History:  Diagnosis Date   Anxiety    pt states she was diagnosed years ago   Asthma    Bipolar disorder ([place])    pt states she was diagnosed years ago   Depression    Diabetes mellitus    Dysrhythmia    per patient; however cardiac monitor December 2021 without atrial fibrillation. She had isolated PACs and couplets, rare episodes of SVT consistent with atrial tachycardia.  Rare ventricular ectopy with isolated PVCs and couplets.   Hypercholesteremia    Hypertension     Noncompliance    Schizophrenia ([place])    pt states she was diagnosed years ago    Past Surgical History:  Procedure Laterality Date   ANTERIOR CERVICAL DECOMP/DISCECTOMY FUSION N/A 05/06/2021   Procedure: ANTERIOR CERVICAL DECOMPRESSION FUSION CERVICAL FOUR-FIVE, CERVICAL FIVE-SIX;  Surgeon: Dawley, Theodoro Doing, DO;  Location: [facility];  Service: Neurosurgery;  Laterality: N/A;   [place] and 2007   ESOPHAGOGASTRODUODENOSCOPY (EGD) WITH PROPOFOL N/A 01/30/2021   Procedure: ESOPHAGOGASTRODUODENOSCOPY (EGD) WITH PROPOFOL;  Surgeon: Daneil Dolin, MD;  [facility];  Service: Endoscopy;  Laterality: N/A;  12:30pm   MALONEY DILATION N/A 01/30/2021   Procedure: Venia Minks DILATION;  Surgeon: Daneil Dolin, MD;  [facility];  Service: Endoscopy;  Laterality: N/A;   TONSILLECTOMY      Family History  Problem Relation Age of Onset   Hypertension Paternal Grandmother    Hypertension Maternal Grandmother    Diabetes Father    Hypertension Father    Cancer Mother        liver   Heart attack Paternal Grandfather    Diabetes Brother    Colon cancer Neg Hx     Social History   Socioeconomic History   Marital status: Single    Spouse name: Not on file   Number of children: Not on file   Years of education: Not  on file   Highest education level: Not on file  Occupational History   Not on file  Tobacco Use   Smoking status: Every Day    Packs/day: 0.50    Years: 20.00    Total pack years: 10.00    Types: Cigarettes   Smokeless tobacco: Never  Vaping Use   Vaping Use: Never used  Substance and Sexual Activity   Alcohol use: Not Currently   Drug use: No   Sexual activity: Yes    Birth control/protection: None  Other Topics Concern   Not on file  Social History Narrative   Not on file   Social Determinants of Health   Financial Resource Strain: Not on file  Food Insecurity: Not on file  Transportation  Needs: Not on file  Physical Activity: Not on file  Stress: Not on file  Social Connections: Not on file  Intimate Partner Violence: Not on file    Outpatient Medications Prior to Visit  Medication Sig Dispense Refill   blood glucose meter kit and supplies Dispense based on patient and insurance preference. Use up to four times daily as directed. (FOR ICD-10 E10.9, E11.9). 1 each 0   diphenhydrAMINE (BENADRYL) 25 MG tablet Take 25 mg by mouth daily as needed for allergies.     methocarbamol (ROBAXIN) 500 MG tablet Take 1 tablet (500 mg total) by mouth 4 (four) times daily. 90 tablet 0   metoprolol tartrate (LOPRESSOR) 50 MG tablet Take 1 tablet (50 mg total) by mouth 2 (two) times daily. 60 tablet 0   omeprazole (PRILOSEC) 40 MG capsule Take 1 capsule by mouth once daily 30 capsule 0   oxyCODONE-acetaminophen (PERCOCET/ROXICET) 5-325 MG tablet Take 1 tablet by mouth every 8 (eight) hours as needed.     PROVENTIL HFA 108 (90 Base) MCG/ACT inhaler INHALE 2 PUFFS BY MOUTH EVERY 6 HOURS AS NEEDED FOR COUGHING, WHEEZING, OR SHORTNESS OF BREATH 20.1 g 1   triamcinolone cream (KENALOG) 0.1 % APPLY TO THE AFFECTED AREA TWICE DAILY AS NEEDED 60 g 1   atorvastatin (LIPITOR) 20 MG tablet TAKE 1 Tablet BY MOUTH ONCE EVERY DAY 60 tablet 2   Dulaglutide (TRULICITY) 6.06 TK/1.6WF SOPN Inject 0.75 mg into the skin once a week. 0.5 mL 12   fenofibrate (TRICOR) 145 MG tablet Take 1 tablet (145 mg total) by mouth daily. 30 tablet 4   lisinopril (ZESTRIL) 10 MG tablet Take 1 tablet by mouth once daily 30 tablet 0   metFORMIN (GLUCOPHAGE) 1000 MG tablet TAKE 1 TABLET BY MOUTH TWICE DAILY WITH MEALS 60 tablet 0   HYDROcodone-acetaminophen (NORCO/VICODIN) 5-325 MG tablet Take 1 tablet by mouth every 4 (four) hours as needed for moderate pain. (Patient not taking: Reported on 01/06/2022) 20 tablet 0   ibuprofen (ADVIL) 800 MG tablet Take 800 mg by mouth every 8 (eight) hours as needed.     Vitamin D, Ergocalciferol,  (DRISDOL) 1.25 MG (50000 UNIT) CAPS capsule Take 1 capsule (50,000 Units total) by mouth every 7 (seven) days. 5 capsule 1   No facility-administered medications prior to visit.    Allergies  Allergen Reactions   Bee Venom Anaphylaxis and Swelling   Buspar [Buspirone] Itching and Anxiety    Dizziness, burning sensation, pupils dilated   Latex Itching   Prednisone     Raises blood sugar   Reglan [Metoclopramide] Hives and Itching   Sulfa Antibiotics Itching and Rash    ROS Review of Systems  Constitutional:  Negative for chills, fatigue  and fever.  Eyes:  Negative for visual disturbance.  Respiratory:  Negative for chest tightness and shortness of breath.   Endocrine: Negative for polydipsia, polyphagia and polyuria.  Genitourinary:  Positive for frequency and urgency. Negative for difficulty urinating and dyspareunia.      Objective:    Physical Exam Cardiovascular:     Rate and Rhythm: Normal rate.     Pulses: Normal pulses.     Heart sounds: Normal heart sounds.  Pulmonary:     Effort: Pulmonary effort is normal.     Breath sounds: Normal breath sounds.  Abdominal:     Tenderness: There is no right CVA tenderness or left CVA tenderness.  Lymphadenopathy:     Cervical: No cervical adenopathy.  Neurological:     Mental Status: She is alert.     BP 138/88 (BP Location: Left Arm)   Pulse 93   Ht '5\' 6"'  (1.676 m)   Wt 190 lb (86.2 kg)   SpO2 94%   BMI 30.67 kg/m  Wt Readings from Last 3 Encounters:  04/22/22 190 lb (86.2 kg)  01/09/22 195 lb 3.2 oz (88.5 kg)  01/06/22 195 lb 3.2 oz (88.5 kg)    Lab Results  Component Value Date   TSH 1.390 04/23/2022   Lab Results  Component Value Date   WBC 12.5 (H) 04/23/2022   HGB 12.7 04/23/2022   HCT 38.4 04/23/2022   MCV 87 04/23/2022   PLT 412 04/23/2022   Lab Results  Component Value Date   NA 137 04/23/2022   K 4.6 04/23/2022   CO2 21 04/23/2022   GLUCOSE 136 (H) 04/23/2022   BUN 11 04/23/2022    CREATININE 0.66 04/23/2022   BILITOT 0.4 04/23/2022   ALKPHOS 78 04/23/2022   AST 17 04/23/2022   ALT 8 04/23/2022   PROT 6.9 04/23/2022   ALBUMIN 4.3 04/23/2022   CALCIUM 9.3 04/23/2022   ANIONGAP 7 08/26/2021   EGFR 110 04/23/2022   Lab Results  Component Value Date   CHOL 146 04/23/2022   Lab Results  Component Value Date   HDL 37 (L) 04/23/2022   Lab Results  Component Value Date   LDLCALC 67 04/23/2022   Lab Results  Component Value Date   TRIG 259 (H) 04/23/2022   Lab Results  Component Value Date   CHOLHDL 3.9 04/23/2022   Lab Results  Component Value Date   HGBA1C 8.5 (H) 04/23/2022      Assessment & Plan:   Problem List Items Addressed This Visit       Endocrine   Type 2 diabetes mellitus with hyperglycemia (HCC) - Primary   Relevant Medications   atorvastatin (LIPITOR) 20 MG tablet   lisinopril (ZESTRIL) 10 MG tablet   metFORMIN (GLUCOPHAGE) 1000 MG tablet   Dulaglutide (TRULICITY) 1.5 ZT/2.4PY SOPN   Other Relevant Orders   CBC with Differential/Platelet (Completed)   CMP14+EGFR (Completed)   Lipid Profile (Completed)   Hemoglobin A1C (Completed)   TSH + free T4 (Completed)     Genitourinary   Cystitis    UA is positive for leukocytes and nitrates Will treated with macorbid given pt's allergy to sulfa Please complete the full course of the antibiotics   You can help prevent UTIs by doing the following:  -Avoid holding urine for prolonged periods; this stretches the bladder and causes bacteria to form because bacteria like warm and wet environments to grow -Empty the bladder as soon as the need arises.  -Empty your bladder  soon after intercourse.  -Take showers instead of baths -Wipe front to back; doing so after urinating and after a bowel movement helps prevent bacteria in the anal region from spreading to the vagina and urethra. -Also, drink a full glass of water to help flush bacteria.        Other   Bipolar 2 disorder ([place])     reports being out of her medications (Risperdal) for >6 months due to medication cost and not following up with her psychiatrist like she used to Referral placed to psychiatry       Hyperlipidemia   Relevant Medications   atorvastatin (LIPITOR) 20 MG tablet   fenofibrate (TRICOR) 145 MG tablet   lisinopril (ZESTRIL) 10 MG tablet   Anxiety and depression    reports taking Celexa and Klonopine but has been out of her medications for > 6 months Denies SI&HI She reports her psychiatrist prescribed her medications Referral placed to psychiatry       Relevant Medications   citalopram (CELEXA) 40 MG tablet   Other Visit Diagnoses     Colon cancer screening       Relevant Orders   Ambulatory referral to Gastroenterology   Vitamin deficiency       Relevant Medications   Vitamin D, Ergocalciferol, (DRISDOL) 1.25 MG (50000 UNIT) CAPS capsule   Other Relevant Orders   Vitamin D (25 hydroxy) (Completed)   Vitamin D deficiency       Essential hypertension       Relevant Medications   atorvastatin (LIPITOR) 20 MG tablet   fenofibrate (TRICOR) 145 MG tablet   lisinopril (ZESTRIL) 10 MG tablet   IFG (impaired fasting glucose)       Relevant Medications   metFORMIN (GLUCOPHAGE) 1000 MG tablet   Other depression       Relevant Medications   citalopram (CELEXA) 40 MG tablet   Bipolar 2 disorder, major depressive episode ([place])       Relevant Orders   Ambulatory referral to Psychiatry   UTI symptoms       Relevant Orders   POCT URINALYSIS DIP (CLINITEK) (Completed)       Meds ordered this encounter  Medications   Vitamin D, Ergocalciferol, (DRISDOL) 1.25 MG (50000 UNIT) CAPS capsule    Sig: Take 1 capsule (50,000 Units total) by mouth every 7 (seven) days.    Dispense:  5 capsule    Refill:  1   atorvastatin (LIPITOR) 20 MG tablet    Sig: TAKE 1 Tablet BY MOUTH ONCE EVERY DAY    Dispense:  60 tablet    Refill:  2   fenofibrate (TRICOR) 145 MG tablet    Sig: Take 1 tablet  (145 mg total) by mouth daily.    Dispense:  90 tablet    Refill:  2   lisinopril (ZESTRIL) 10 MG tablet    Sig: Take 1 tablet by mouth once daily    Dispense:  90 tablet    Refill:  2   metFORMIN (GLUCOPHAGE) 1000 MG tablet    Sig: Take 1 tablet (1,000 mg total) by mouth 2 (two) times daily with a meal.    Dispense:  90 tablet    Refill:  2   Dulaglutide (TRULICITY) 1.5 XA/7.5SV SOPN    Sig: Inject 1.5 mg into the skin once a week for 28 days, THEN 3 mg once a week for 28 days.    Dispense:  6 mL    Refill:  0  citalopram (CELEXA) 40 MG tablet    Sig: Take 1 tablet (40 mg total) by mouth daily.    Dispense:  90 tablet    Refill:  1   nitrofurantoin, macrocrystal-monohydrate, (MACROBID) 100 MG capsule    Sig: Take 1 capsule (100 mg total) by mouth 2 (two) times daily for 5 days.    Dispense:  10 capsule    Refill:  0    Follow-up: Return in about 3 months (around 07/23/2022).    Alvira Monday, FNP

## 2022-04-23 ENCOUNTER — Other Ambulatory Visit: Payer: Self-pay | Admitting: Family Medicine

## 2022-04-23 ENCOUNTER — Encounter: Payer: Self-pay | Admitting: *Deleted

## 2022-04-24 LAB — CBC WITH DIFFERENTIAL/PLATELET
Basophils Absolute: 0.1 10*3/uL (ref 0.0–0.2)
Basos: 1 %
EOS (ABSOLUTE): 1.2 10*3/uL — ABNORMAL HIGH (ref 0.0–0.4)
Eos: 9 %
Hematocrit: 38.4 % (ref 34.0–46.6)
Hemoglobin: 12.7 g/dL (ref 11.1–15.9)
Immature Grans (Abs): 0.1 10*3/uL (ref 0.0–0.1)
Immature Granulocytes: 1 %
Lymphocytes Absolute: 2.7 10*3/uL (ref 0.7–3.1)
Lymphs: 22 %
MCH: 28.6 pg (ref 26.6–33.0)
MCHC: 33.1 g/dL (ref 31.5–35.7)
MCV: 87 fL (ref 79–97)
Monocytes Absolute: 0.4 10*3/uL (ref 0.1–0.9)
Monocytes: 3 %
Neutrophils Absolute: 8.1 10*3/uL — ABNORMAL HIGH (ref 1.4–7.0)
Neutrophils: 64 %
Platelets: 412 10*3/uL (ref 150–450)
RBC: 4.44 x10E6/uL (ref 3.77–5.28)
RDW: 16.8 % — ABNORMAL HIGH (ref 11.7–15.4)
WBC: 12.5 10*3/uL — ABNORMAL HIGH (ref 3.4–10.8)

## 2022-04-24 LAB — LIPID PANEL
Chol/HDL Ratio: 3.9 ratio (ref 0.0–4.4)
Cholesterol, Total: 146 mg/dL (ref 100–199)
HDL: 37 mg/dL — ABNORMAL LOW (ref >39)
LDL Chol Calc (NIH): 67 mg/dL (ref 0–99)
Triglycerides: 259 mg/dL — ABNORMAL HIGH (ref 0–149)
VLDL Cholesterol Cal: 42 mg/dL — ABNORMAL HIGH (ref 5–40)

## 2022-04-24 LAB — CMP14+EGFR
ALT: 8 IU/L (ref 0–32)
AST: 17 IU/L (ref 0–40)
Albumin/Globulin Ratio: 1.7 (ref 1.2–2.2)
Albumin: 4.3 g/dL (ref 3.9–4.9)
Alkaline Phosphatase: 78 IU/L (ref 44–121)
BUN/Creatinine Ratio: 17 (ref 9–23)
BUN: 11 mg/dL (ref 6–24)
Bilirubin Total: 0.4 mg/dL (ref 0.0–1.2)
CO2: 21 mmol/L (ref 20–29)
Calcium: 9.3 mg/dL (ref 8.7–10.2)
Chloride: 102 mmol/L (ref 96–106)
Creatinine, Ser: 0.66 mg/dL (ref 0.57–1.00)
Globulin, Total: 2.6 g/dL (ref 1.5–4.5)
Glucose: 136 mg/dL — ABNORMAL HIGH (ref 70–99)
Potassium: 4.6 mmol/L (ref 3.5–5.2)
Sodium: 137 mmol/L (ref 134–144)
Total Protein: 6.9 g/dL (ref 6.0–8.5)
eGFR: 110 mL/min/{1.73_m2} (ref >59)

## 2022-04-24 LAB — HEMOGLOBIN A1C
Est. average glucose Bld gHb Est-mCnc: 197 mg/dL
Hgb A1c MFr Bld: 8.5 % — ABNORMAL HIGH (ref 4.8–5.6)

## 2022-04-24 LAB — TSH+FREE T4
Free T4: 1.36 ng/dL (ref 0.82–1.77)
TSH: 1.39 u[IU]/mL (ref 0.450–4.500)

## 2022-04-24 LAB — VITAMIN D 25 HYDROXY (VIT D DEFICIENCY, FRACTURES): Vit D, 25-Hydroxy: 20.5 ng/mL — ABNORMAL LOW (ref 30.0–100.0)

## 2022-04-26 DIAGNOSIS — F419 Anxiety disorder, unspecified: Secondary | ICD-10-CM | POA: Insufficient documentation

## 2022-04-26 DIAGNOSIS — F32A Depression, unspecified: Secondary | ICD-10-CM | POA: Insufficient documentation

## 2022-04-26 NOTE — Assessment & Plan Note (Signed)
reports taking Celexa and Klonopine but has been out of her medications for > 6 months Denies SI&HI She reports her psychiatrist prescribed her medications Referral placed to psychiatry

## 2022-04-26 NOTE — Assessment & Plan Note (Addendum)
reports being out of her medications (Risperdal) for >6 months due to medication cost and not following up with her psychiatrist like she used to Referral placed to psychiatry

## 2022-04-26 NOTE — Assessment & Plan Note (Signed)
UA is positive for leukocytes and nitrates Will treated with macorbid given pt's allergy to sulfa Please complete the full course of the antibiotics   You can help prevent UTIs by doing the following:  -Avoid holding urine for prolonged periods; this stretches the bladder and causes bacteria to form because bacteria like warm and wet environments to grow -Empty the bladder as soon as the need arises.  -Empty your bladder soon after intercourse.  -Take showers instead of baths -Wipe front to back; doing so after urinating and after a bowel movement helps prevent bacteria in the anal region from spreading to the vagina and urethra. -Also, drink a full glass of water to help flush bacteria.

## 2022-04-26 NOTE — Assessment & Plan Note (Signed)
Denies the 3ps of diabetes Will increase trulicty 0.'75mg'$  to 1.'5mg'$  for 28 days and '3mg'$  for 28 days Encouraged low-calorie and sugar diet with increased physical activities

## 2022-04-28 ENCOUNTER — Other Ambulatory Visit: Payer: Self-pay | Admitting: Family Medicine

## 2022-04-28 NOTE — Progress Notes (Signed)
Please inform the patient that her hgA1C increased from 7.5 three months ago to 8.5. I will like those numbers to decrease. Please advise her to continue taking her trulicity and metformin. The dose of trulicty has been increased.  Continue taking your Lipitor; her cholesterol levels are trending down. Her vit D is still slightly low, and I encouraged her to continue taking vit d supplements.

## 2022-05-25 ENCOUNTER — Encounter (HOSPITAL_COMMUNITY): Payer: Self-pay

## 2022-05-25 ENCOUNTER — Telehealth (HOSPITAL_COMMUNITY): Payer: PRIVATE HEALTH INSURANCE | Admitting: Psychiatry

## 2022-06-04 ENCOUNTER — Other Ambulatory Visit: Payer: Self-pay | Admitting: Family Medicine

## 2022-06-04 DIAGNOSIS — K219 Gastro-esophageal reflux disease without esophagitis: Secondary | ICD-10-CM

## 2022-06-29 ENCOUNTER — Other Ambulatory Visit: Payer: Self-pay | Admitting: Family Medicine

## 2022-07-15 ENCOUNTER — Other Ambulatory Visit: Payer: Self-pay | Admitting: Family Medicine

## 2022-07-15 DIAGNOSIS — K219 Gastro-esophageal reflux disease without esophagitis: Secondary | ICD-10-CM

## 2022-07-24 ENCOUNTER — Ambulatory Visit: Payer: Medicaid Other | Admitting: Family Medicine

## 2022-08-12 ENCOUNTER — Ambulatory Visit (INDEPENDENT_AMBULATORY_CARE_PROVIDER_SITE_OTHER): Payer: Medicaid Other | Admitting: Family Medicine

## 2022-08-12 ENCOUNTER — Encounter: Payer: Self-pay | Admitting: Family Medicine

## 2022-08-12 VITALS — BP 119/76 | HR 73 | Resp 16 | Ht 66.0 in | Wt 186.0 lb

## 2022-08-12 DIAGNOSIS — E7849 Other hyperlipidemia: Secondary | ICD-10-CM | POA: Diagnosis not present

## 2022-08-12 DIAGNOSIS — Z114 Encounter for screening for human immunodeficiency virus [HIV]: Secondary | ICD-10-CM

## 2022-08-12 DIAGNOSIS — R7301 Impaired fasting glucose: Secondary | ICD-10-CM

## 2022-08-12 DIAGNOSIS — F419 Anxiety disorder, unspecified: Secondary | ICD-10-CM

## 2022-08-12 DIAGNOSIS — E559 Vitamin D deficiency, unspecified: Secondary | ICD-10-CM

## 2022-08-12 DIAGNOSIS — E038 Other specified hypothyroidism: Secondary | ICD-10-CM

## 2022-08-12 DIAGNOSIS — E1165 Type 2 diabetes mellitus with hyperglycemia: Secondary | ICD-10-CM

## 2022-08-12 DIAGNOSIS — F32A Depression, unspecified: Secondary | ICD-10-CM

## 2022-08-12 DIAGNOSIS — L0291 Cutaneous abscess, unspecified: Secondary | ICD-10-CM | POA: Diagnosis not present

## 2022-08-12 DIAGNOSIS — I10 Essential (primary) hypertension: Secondary | ICD-10-CM

## 2022-08-12 DIAGNOSIS — K219 Gastro-esophageal reflux disease without esophagitis: Secondary | ICD-10-CM

## 2022-08-12 DIAGNOSIS — Z1159 Encounter for screening for other viral diseases: Secondary | ICD-10-CM

## 2022-08-12 MED ORDER — OMEPRAZOLE 40 MG PO CPDR: 40.0000 mg | DELAYED_RELEASE_CAPSULE | Freq: Every day | ORAL | 1 refills | Status: DC

## 2022-08-12 MED ORDER — BLOOD GLUCOSE METER KIT: PACK | 0 refills | Status: DC

## 2022-08-12 MED ORDER — METFORMIN HCL 1000 MG PO TABS: 1000.0000 mg | ORAL_TABLET | Freq: Two times a day (BID) | ORAL | 2 refills | Status: DC

## 2022-08-12 MED ORDER — DOXYCYCLINE HYCLATE 100 MG PO TABS: 100.0000 mg | ORAL_TABLET | Freq: Two times a day (BID) | ORAL | 0 refills | Status: AC

## 2022-08-12 NOTE — Patient Instructions (Addendum)
I appreciate the opportunity to provide care to you today!    Follow up:  3 months  Labs: please stop by the lab during the week to get your blood drawn (CBC, CMP, TSH, Lipid profile, HgA1c, Vit D)  Screening: HIV and Hep C    Please pick up your medications at the pharmacy    Please continue to a heart-healthy diet and increase your physical activities. Try to exercise for 35mns at least three times a week.      It was a pleasure to see you and I look forward to continuing to work together on your health and well-being. Please do not hesitate to call the office if you need care or have questions about your care.   Have a wonderful day and week. With Gratitude, GAlvira MondayMSN, FNP-BC

## 2022-08-12 NOTE — Progress Notes (Unsigned)
Established Patient Office Visit  Subjective:  Patient ID: Shari Collins, female    DOB: 18-Aug-1976  Age: 46 y.o. MRN: [id number]  CC:  Chief Complaint  Patient presents with   Cyst    Has an inflamed cyst on her chin x 5 days. Was draining greenish to clear blood tinged and very sore     HPI Shari Collins is a 46 y.o. female with past medical history of T2DM presents for f/u of  chronic medical conditions. T2DM: reports doing well with trulicity and reports taking metformin 1000 mg BID.  The highest blood sugar is 280, and the lowest blood sugar is 80. She denies the 3ps of diabteses. She checks her blood sugar 3-4 times daily. Dyslipidemia: she reports being out of her cholesterol medication (Lipitor 20) for 3 weeks.  Fenofibrate 145 mg daily She reports only getting the drugs she can afford since she is now being charged for her medicines.   Skin Abscess:   HTN: lisinopril 10 mg Depression and anxiety: reports taking Celexa 40 mg and Klonopine but has been out of her medications for > 6 months. She reports her psychiatrist prescribed her medications.    Past Medical History:  Diagnosis Date   Anxiety    pt states she was diagnosed years ago   Asthma    Bipolar disorder ([place])    pt states she was diagnosed years ago   Depression    Diabetes mellitus    Dysrhythmia    per patient; however cardiac monitor December 2021 without atrial fibrillation. She had isolated PACs and couplets, rare episodes of SVT consistent with atrial tachycardia.  Rare ventricular ectopy with isolated PVCs and couplets.   Hypercholesteremia    Hypertension    Noncompliance    Schizophrenia ([place])    pt states she was diagnosed years ago    Past Surgical History:  Procedure Laterality Date   ANTERIOR CERVICAL DECOMP/DISCECTOMY FUSION N/A 05/06/2021   Procedure: ANTERIOR CERVICAL DECOMPRESSION FUSION CERVICAL FOUR-FIVE, CERVICAL FIVE-SIX;  Surgeon: Dawley, Theodoro Doing, DO;  Location: [facility];   Service: Neurosurgery;  Laterality: N/A;   [place] and 2007   ESOPHAGOGASTRODUODENOSCOPY (EGD) WITH PROPOFOL N/A 01/30/2021   Procedure: ESOPHAGOGASTRODUODENOSCOPY (EGD) WITH PROPOFOL;  Surgeon: Daneil Dolin, MD;  [facility];  Service: Endoscopy;  Laterality: N/A;  12:30pm   MALONEY DILATION N/A 01/30/2021   Procedure: Venia Minks DILATION;  Surgeon: Daneil Dolin, MD;  [facility];  Service: Endoscopy;  Laterality: N/A;   TONSILLECTOMY      Family History  Problem Relation Age of Onset   Hypertension Paternal Grandmother    Hypertension Maternal Grandmother    Diabetes Father    Hypertension Father    Cancer Mother        liver   Heart attack Paternal Grandfather    Diabetes Brother    Colon cancer Neg Hx     Social History   Socioeconomic History   Marital status: Single    Spouse name: Not on file   Number of children: Not on file   Years of education: Not on file   Highest education level: Not on file  Occupational History   Not on file  Tobacco Use   Smoking status: Every Day    Packs/day: 0.50    Years: 20.00    Total pack years: 10.00    Types: Cigarettes  Smokeless tobacco: Never  Vaping Use   Vaping Use: Never used  Substance and Sexual Activity   Alcohol use: Not Currently   Drug use: No   Sexual activity: Yes    Birth control/protection: None  Other Topics Concern   Not on file  Social History Narrative   Not on file   Social Determinants of Health   Financial Resource Strain: Not on file  Food Insecurity: Not on file  Transportation Needs: Not on file  Physical Activity: Not on file  Stress: Not on file  Social Connections: Not on file  Intimate Partner Violence: Not on file    Outpatient Medications Prior to Visit  Medication Sig Dispense Refill   ACCU-CHEK GUIDE test strip USE  STRIP TO CHECK GLUCOSE UP TO 4 TIMES DAILY 100 each 0   atorvastatin  (LIPITOR) 20 MG tablet TAKE 1 Tablet BY MOUTH ONCE EVERY DAY 60 tablet 2   blood glucose meter kit and supplies Dispense based on patient and insurance preference. Use up to four times daily as directed. (FOR ICD-10 E10.9, E11.9). 1 each 0   citalopram (CELEXA) 40 MG tablet Take 1 tablet (40 mg total) by mouth daily. 90 tablet 1   diphenhydrAMINE (BENADRYL) 25 MG tablet Take 25 mg by mouth daily as needed for allergies.     fenofibrate (TRICOR) 145 MG tablet Take 1 tablet (145 mg total) by mouth daily. 90 tablet 2   ibuprofen (ADVIL) 800 MG tablet Take 800 mg by mouth every 8 (eight) hours as needed.     lisinopril (ZESTRIL) 10 MG tablet Take 1 tablet by mouth once daily 90 tablet 2   metFORMIN (GLUCOPHAGE) 1000 MG tablet Take 1 tablet (1,000 mg total) by mouth 2 (two) times daily with a meal. 90 tablet 2   metoprolol tartrate (LOPRESSOR) 50 MG tablet Take 1 tablet (50 mg total) by mouth 2 (two) times daily. 60 tablet 0   omeprazole (PRILOSEC) 40 MG capsule Take 1 capsule by mouth once daily 30 capsule 0   PROVENTIL HFA 108 (90 Base) MCG/ACT inhaler INHALE 2 PUFFS BY MOUTH EVERY 6 HOURS AS NEEDED FOR COUGHING, WHEEZING, OR SHORTNESS OF BREATH 20.1 g 1   triamcinolone cream (KENALOG) 0.1 % APPLY TO THE AFFECTED AREA TWICE DAILY AS NEEDED 60 g 1   Vitamin D, Ergocalciferol, (DRISDOL) 1.25 MG (50000 UNIT) CAPS capsule Take 1 capsule (50,000 Units total) by mouth every 7 (seven) days. 5 capsule 1   methocarbamol (ROBAXIN) 500 MG tablet Take 1 tablet (500 mg total) by mouth 4 (four) times daily. 90 tablet 0   oxyCODONE-acetaminophen (PERCOCET/ROXICET) 5-325 MG tablet Take 1 tablet by mouth every 8 (eight) hours as needed.     No facility-administered medications prior to visit.    Allergies  Allergen Reactions   Bee Venom Anaphylaxis and Swelling   Buspar [Buspirone] Itching and Anxiety    Dizziness, burning sensation, pupils dilated   Latex Itching   Prednisone     Raises blood sugar   Reglan  [Metoclopramide] Hives and Itching   Sulfa Antibiotics Itching and Rash    ROS Review of Systems  Constitutional:  Negative for chills, fatigue and fever.  Eyes:  Negative for visual disturbance.  Respiratory:  Negative for chest tightness and shortness of breath.   Endocrine: Negative for polydipsia, polyphagia and polyuria.  Genitourinary:  Positive for frequency and urgency. Negative for difficulty urinating and dyspareunia.      Objective:    Physical Exam Cardiovascular:  Rate and Rhythm: Normal rate.     Pulses: Normal pulses.     Heart sounds: Normal heart sounds.  Pulmonary:     Effort: Pulmonary effort is normal.     Breath sounds: Normal breath sounds.  Abdominal:     Tenderness: There is no right CVA tenderness or left CVA tenderness.  Lymphadenopathy:     Cervical: No cervical adenopathy.  Neurological:     Mental Status: She is alert.     BP 119/76   Pulse 73   Resp 16   Ht _0  (1.676 m)   Wt 186 lb (84.4 kg)   SpO2 98%   BMI 30.02 kg/m  Wt Readings from Last 3 Encounters:  08/12/22 186 lb (84.4 kg)  04/22/22 190 lb (86.2 kg)  01/09/22 195 lb 3.2 oz (88.5 kg)    Lab Results  Component Value Date   TSH 1.390 04/23/2022   Lab Results  Component Value Date   WBC 12.5 (H) 04/23/2022   HGB 12.7 04/23/2022   HCT 38.4 04/23/2022   MCV 87 04/23/2022   PLT 412 04/23/2022   Lab Results  Component Value Date   NA 137 04/23/2022   K 4.6 04/23/2022   CO2 21 04/23/2022   GLUCOSE 136 (H) 04/23/2022   BUN 11 04/23/2022   CREATININE 0.66 04/23/2022   BILITOT 0.4 04/23/2022   ALKPHOS 78 04/23/2022   AST 17 04/23/2022   ALT 8 04/23/2022   PROT 6.9 04/23/2022   ALBUMIN 4.3 04/23/2022   CALCIUM 9.3 04/23/2022   ANIONGAP 7 08/26/2021   EGFR 110 04/23/2022   Lab Results  Component Value Date   CHOL 146 04/23/2022   Lab Results  Component Value Date   HDL 37 (L) 04/23/2022   Lab Results  Component Value Date   LDLCALC 67 04/23/2022    Lab Results  Component Value Date   TRIG 259 (H) 04/23/2022   Lab Results  Component Value Date   CHOLHDL 3.9 04/23/2022   Lab Results  Component Value Date   HGBA1C 8.5 (H) 04/23/2022      Assessment & Plan:   Problem List Items Addressed This Visit   None  No orders of the defined types were placed in this encounter.   Follow-up: No follow-ups on file.    Alvira Monday, FNP Physical Exam Cardiovascular:     Rate and Rhythm: Normal rate.     Pulses: Normal pulses.     Heart sounds: Normal heart sounds.  Pulmonary:     Effort: Pulmonary effort is normal.     Breath sounds: Normal breath sounds.  Abdominal:     Tenderness: There is no right CVA tenderness or left CVA tenderness.  Lymphadenopathy:     Cervical: No cervical adenopathy.  Neurological:     Mental Status: She is alert.

## 2022-08-13 DIAGNOSIS — L0291 Cutaneous abscess, unspecified: Secondary | ICD-10-CM | POA: Insufficient documentation

## 2022-08-13 DIAGNOSIS — I10 Essential (primary) hypertension: Secondary | ICD-10-CM | POA: Insufficient documentation

## 2022-08-13 NOTE — Assessment & Plan Note (Signed)
Will treat with doxycycline 100 mg twice daily for 7 days

## 2022-08-13 NOTE — Assessment & Plan Note (Signed)
Encouraged to continue taking Celexa 40 mg daily and to follow-up with her psychiatrist as needed

## 2022-08-13 NOTE — Assessment & Plan Note (Signed)
Encouraged to continue taking metformin 1000 mg twice daily Will assess hemoglobin A1c today Lab Results  Component Value Date   HGBA1C 8.5 (H) 04/23/2022

## 2022-08-13 NOTE — Assessment & Plan Note (Signed)
Encouraged to continue taking fenofibrate 145 mg daily and atorvastatin 20 mg daily Will assess lipid panel today Lab Results  Component Value Date   CHOL 146 04/23/2022   HDL 37 (L) 04/23/2022   LDLCALC 67 04/23/2022   TRIG 259 (H) 04/23/2022   CHOLHDL 3.9 04/23/2022

## 2022-08-13 NOTE — Assessment & Plan Note (Signed)
Controlled Encouraged to continue taking lisinopril 10 mg daily Will assess CBC and CMP today BP Readings from Last 3 Encounters:  08/12/22 119/76  04/22/22 138/88  01/10/22 121/85

## 2022-09-03 ENCOUNTER — Other Ambulatory Visit: Payer: Self-pay | Admitting: Family Medicine

## 2022-09-03 DIAGNOSIS — E1165 Type 2 diabetes mellitus with hyperglycemia: Secondary | ICD-10-CM

## 2022-09-03 NOTE — Telephone Encounter (Signed)
Please encouraged the patient to come in for fasting labs before refilling Trulicity 1.5 mg.  She is currently only taking metformin 1000 mg twice daily

## 2022-09-12 LAB — CBC WITH DIFFERENTIAL/PLATELET
Basophils Absolute: 0.1 10*3/uL (ref 0.0–0.2)
Basos: 1 %
EOS (ABSOLUTE): 1 10*3/uL — ABNORMAL HIGH (ref 0.0–0.4)
Eos: 9 %
Hematocrit: 36.2 % (ref 34.0–46.6)
Hemoglobin: 12.1 g/dL (ref 11.1–15.9)
Immature Grans (Abs): 0.1 10*3/uL (ref 0.0–0.1)
Immature Granulocytes: 1 %
Lymphocytes Absolute: 2.6 10*3/uL (ref 0.7–3.1)
Lymphs: 24 %
MCH: 28.9 pg (ref 26.6–33.0)
MCHC: 33.4 g/dL (ref 31.5–35.7)
MCV: 86 fL (ref 79–97)
Monocytes Absolute: 0.6 10*3/uL (ref 0.1–0.9)
Monocytes: 6 %
Neutrophils Absolute: 6.5 10*3/uL (ref 1.4–7.0)
Neutrophils: 59 %
Platelets: 448 10*3/uL (ref 150–450)
RBC: 4.19 x10E6/uL (ref 3.77–5.28)
RDW: 15.5 % — ABNORMAL HIGH (ref 11.7–15.4)
WBC: 10.8 10*3/uL (ref 3.4–10.8)

## 2022-09-12 LAB — CMP14+EGFR
ALT: 9 IU/L (ref 0–32)
AST: 15 IU/L (ref 0–40)
Albumin/Globulin Ratio: 1.6 (ref 1.2–2.2)
Albumin: 4.1 g/dL (ref 3.9–4.9)
Alkaline Phosphatase: 60 IU/L (ref 44–121)
BUN/Creatinine Ratio: 21 (ref 9–23)
BUN: 12 mg/dL (ref 6–24)
Bilirubin Total: 0.3 mg/dL (ref 0.0–1.2)
CO2: 23 mmol/L (ref 20–29)
Calcium: 9.2 mg/dL (ref 8.7–10.2)
Chloride: 101 mmol/L (ref 96–106)
Creatinine, Ser: 0.57 mg/dL (ref 0.57–1.00)
Globulin, Total: 2.5 g/dL (ref 1.5–4.5)
Glucose: 150 mg/dL — ABNORMAL HIGH (ref 70–99)
Potassium: 4.7 mmol/L (ref 3.5–5.2)
Sodium: 137 mmol/L (ref 134–144)
Total Protein: 6.6 g/dL (ref 6.0–8.5)
eGFR: 113 mL/min/{1.73_m2} (ref >59)

## 2022-09-12 LAB — MICROALBUMIN / CREATININE URINE RATIO
Creatinine, Urine: 88.5 mg/dL
Microalb/Creat Ratio: 16 mg/g creat (ref 0–29)
Microalbumin, Urine: 14.3 ug/mL

## 2022-09-12 LAB — LIPID PANEL
Chol/HDL Ratio: 3.5 ratio (ref 0.0–4.4)
Cholesterol, Total: 148 mg/dL (ref 100–199)
HDL: 42 mg/dL (ref >39)
LDL Chol Calc (NIH): 89 mg/dL (ref 0–99)
Triglycerides: 89 mg/dL (ref 0–149)
VLDL Cholesterol Cal: 17 mg/dL (ref 5–40)

## 2022-09-12 LAB — HEPATITIS C ANTIBODY: Hep C Virus Ab: NONREACTIVE

## 2022-09-12 LAB — VITAMIN D 25 HYDROXY (VIT D DEFICIENCY, FRACTURES): Vit D, 25-Hydroxy: 23.3 ng/mL — ABNORMAL LOW (ref 30.0–100.0)

## 2022-09-12 LAB — HEMOGLOBIN A1C
Est. average glucose Bld gHb Est-mCnc: 180 mg/dL
Hgb A1c MFr Bld: 7.9 % — ABNORMAL HIGH (ref 4.8–5.6)

## 2022-09-12 LAB — HIV ANTIBODY (ROUTINE TESTING W REFLEX): HIV Screen 4th Generation wRfx: NONREACTIVE

## 2022-09-12 LAB — TSH+FREE T4
Free T4: 1.35 ng/dL (ref 0.82–1.77)
TSH: 1.82 u[IU]/mL (ref 0.450–4.500)

## 2022-09-14 ENCOUNTER — Other Ambulatory Visit: Payer: Self-pay | Admitting: Family Medicine

## 2022-09-14 DIAGNOSIS — E569 Vitamin deficiency, unspecified: Secondary | ICD-10-CM

## 2022-09-14 DIAGNOSIS — E1165 Type 2 diabetes mellitus with hyperglycemia: Secondary | ICD-10-CM

## 2022-09-14 MED ORDER — VITAMIN D (ERGOCALCIFEROL) 1.25 MG (50000 UNIT) PO CAPS: 50000.0000 [IU] | ORAL_CAPSULE | ORAL | 1 refills | Status: DC

## 2022-09-14 MED ORDER — EMPAGLIFLOZIN 10 MG PO TABS: 10.0000 mg | ORAL_TABLET | Freq: Every day | ORAL | 2 refills | Status: DC

## 2022-09-28 ENCOUNTER — Telehealth: Payer: Self-pay | Admitting: Family Medicine

## 2022-09-28 NOTE — Telephone Encounter (Signed)
Patient called in states that TRULICITY  Is not working for her.  Wants a call back.  Do not see trulicity on med list , does have JARDIENCE

## 2022-09-28 NOTE — Telephone Encounter (Signed)
Tried calling pt back lmtrc.

## 2022-09-28 NOTE — Telephone Encounter (Signed)
Pt returned call and hung up while in the middle of the call.

## 2022-10-06 ENCOUNTER — Telehealth: Payer: Self-pay | Admitting: Family Medicine

## 2022-10-06 ENCOUNTER — Other Ambulatory Visit: Payer: Self-pay | Admitting: Family Medicine

## 2022-10-06 DIAGNOSIS — R7301 Impaired fasting glucose: Secondary | ICD-10-CM

## 2022-10-06 NOTE — Telephone Encounter (Signed)
Spoke to someone call was then disconnected.

## 2022-10-06 NOTE — Telephone Encounter (Signed)
Pt called stating trulisity is not working & is wanting it changed?

## 2022-10-07 MED ORDER — ACCU-CHEK GUIDE VI STRP: ORAL_STRIP | 0 refills | Status: DC

## 2022-10-07 MED ORDER — BLOOD GLUCOSE METER KIT: PACK | 0 refills | Status: AC

## 2022-10-07 NOTE — Telephone Encounter (Signed)
Pt states trulicity is not keeping her blood sugar , this morning reading was almost 200, has been using 3 metformin a day to keep blood sugar down, needs an alternative, made aware pcp is out of the office and will return tomorrow will await a call back tomorrow.

## 2022-10-08 ENCOUNTER — Other Ambulatory Visit: Payer: Self-pay | Admitting: Family Medicine

## 2022-10-08 NOTE — Telephone Encounter (Signed)
Kindly ask the patient to schedule an office visit

## 2022-10-19 ENCOUNTER — Encounter: Payer: Medicaid Other | Admitting: Family Medicine

## 2022-10-19 NOTE — Telephone Encounter (Signed)
Scheduled a video call

## 2022-10-20 NOTE — Progress Notes (Signed)
Erroneous encounter-disregard

## 2022-10-27 ENCOUNTER — Other Ambulatory Visit: Payer: Self-pay | Admitting: Family Medicine

## 2022-10-27 DIAGNOSIS — K219 Gastro-esophageal reflux disease without esophagitis: Secondary | ICD-10-CM

## 2022-10-29 ENCOUNTER — Other Ambulatory Visit: Payer: Self-pay | Admitting: Family Medicine

## 2022-10-29 ENCOUNTER — Other Ambulatory Visit: Payer: Self-pay | Admitting: Internal Medicine

## 2022-10-29 MED ORDER — ACCU-CHEK GUIDE VI STRP: ORAL_STRIP | 0 refills | Status: AC

## 2022-11-13 ENCOUNTER — Ambulatory Visit: Payer: Medicaid Other | Admitting: Family Medicine

## 2022-11-13 ENCOUNTER — Encounter: Payer: Self-pay | Admitting: Family Medicine

## 2022-12-15 ENCOUNTER — Other Ambulatory Visit: Payer: Self-pay | Admitting: Family Medicine

## 2022-12-15 DIAGNOSIS — E1165 Type 2 diabetes mellitus with hyperglycemia: Secondary | ICD-10-CM

## 2022-12-15 DIAGNOSIS — R7301 Impaired fasting glucose: Secondary | ICD-10-CM

## 2022-12-15 DIAGNOSIS — K219 Gastro-esophageal reflux disease without esophagitis: Secondary | ICD-10-CM

## 2023-01-05 ENCOUNTER — Other Ambulatory Visit: Payer: Self-pay | Admitting: Family Medicine

## 2023-01-05 DIAGNOSIS — R7301 Impaired fasting glucose: Secondary | ICD-10-CM

## 2023-01-06 ENCOUNTER — Telehealth: Payer: Self-pay | Admitting: Family Medicine

## 2023-01-06 NOTE — Telephone Encounter (Signed)
Pt called in regard Trulicity.  Shari Collins has not heard anything back ion regard to med , pharm has not received any request to fill med. Pt wants a call back in regard.

## 2023-01-07 ENCOUNTER — Other Ambulatory Visit: Payer: Self-pay

## 2023-01-07 DIAGNOSIS — E1165 Type 2 diabetes mellitus with hyperglycemia: Secondary | ICD-10-CM

## 2023-01-07 NOTE — Telephone Encounter (Signed)
Lmtrc

## 2023-01-10 ENCOUNTER — Other Ambulatory Visit: Payer: Self-pay | Admitting: Family Medicine

## 2023-01-10 DIAGNOSIS — K219 Gastro-esophageal reflux disease without esophagitis: Secondary | ICD-10-CM

## 2023-01-11 NOTE — Telephone Encounter (Signed)
Medication is not on active medications lmtrc.

## 2023-01-12 NOTE — Telephone Encounter (Signed)
Attempted to contact pt unable to reach her.

## 2023-02-01 ENCOUNTER — Emergency Department (HOSPITAL_COMMUNITY)
Admission: EM | Admit: 2023-02-01 | Discharge: 2023-02-02 | Disposition: A | Discharge status: Home / Self Care | Payer: Medicaid Other | Attending: Emergency Medicine | Admitting: Emergency Medicine

## 2023-02-01 ENCOUNTER — Emergency Department (HOSPITAL_COMMUNITY): Payer: Medicaid Other

## 2023-02-01 ENCOUNTER — Other Ambulatory Visit: Payer: Self-pay

## 2023-02-01 ENCOUNTER — Encounter (HOSPITAL_COMMUNITY): Payer: Self-pay | Admitting: Emergency Medicine

## 2023-02-01 DIAGNOSIS — S161XXA Strain of muscle, fascia and tendon at neck level, initial encounter: Secondary | ICD-10-CM | POA: Diagnosis not present

## 2023-02-01 DIAGNOSIS — W108XXA Fall (on) (from) other stairs and steps, initial encounter: Secondary | ICD-10-CM | POA: Diagnosis not present

## 2023-02-01 DIAGNOSIS — S199XXA Unspecified injury of neck, initial encounter: Secondary | ICD-10-CM | POA: Diagnosis present

## 2023-02-01 DIAGNOSIS — W19XXXA Unspecified fall, initial encounter: Secondary | ICD-10-CM

## 2023-02-01 DIAGNOSIS — S0990XA Unspecified injury of head, initial encounter: Secondary | ICD-10-CM | POA: Diagnosis not present

## 2023-02-01 NOTE — ED Triage Notes (Signed)
Pt reports falling down stairs earlier tonight, hitting head. Pt reports headache and neck pain. Denies other injury.

## 2023-02-02 MED ORDER — ACETAMINOPHEN 500 MG PO TABS
1000.0000 mg | ORAL_TABLET | Freq: Once | ORAL | Status: AC
  Administered 2023-02-02: 1000 mg via ORAL
  Filled 2023-02-02: qty 2

## 2023-02-02 NOTE — ED Provider Notes (Signed)
[place] EMERGENCY DEPARTMENT AT [facility]  Provider Note  CSN: [id number] Arrival date & time: 02/01/23 2224  History Chief Complaint  Patient presents with   Fall    Shari Collins is a 47 y.o. female reports she was helping her husband who was having a seizure when she fell down their front steps, around 2pm today. She reports she hit her head and injured her neck. She has had neck soreness and headache since the fall. No LOC. Not on blood thinners. Has had prior neck surgery. She took motrin without improvement at home.    Home Medications Prior to Admission medications   Medication Sig Start Date End Date Taking? Authorizing Provider  atorvastatin (LIPITOR) 20 MG tablet TAKE 1 Tablet BY MOUTH ONCE EVERY DAY 04/22/22   Gilmore Laroche, FNP  blood glucose meter kit and supplies Dispense based on patient and insurance preference. Use up to four times daily as directed. (FOR ICD-10 E10.9, E11.9). 10/07/22   Gilmore Laroche, FNP  citalopram (CELEXA) 40 MG tablet Take 1 tablet (40 mg total) by mouth daily. 04/22/22   Gilmore Laroche, FNP  diphenhydrAMINE (BENADRYL) 25 MG tablet Take 25 mg by mouth daily as needed for allergies.    [provider]  fenofibrate (TRICOR) 145 MG tablet Take 1 tablet (145 mg total) by mouth daily. 04/22/22   Gilmore Laroche, FNP  glucose blood (ACCU-CHEK GUIDE) test strip Use as instructed 10/29/22   Gilmore Laroche, FNP  ibuprofen (ADVIL) 800 MG tablet Take 800 mg by mouth every 8 (eight) hours as needed. 12/17/21   [provider]  JARDIANCE 10 MG TABS tablet TAKE 1 TABLET BY MOUTH ONCE DAILY BEFORE BREAKFAST 12/15/22   Gilmore Laroche, FNP  lisinopril (ZESTRIL) 10 MG tablet Take 1 tablet by mouth once daily 04/22/22   Gilmore Laroche, FNP  metFORMIN (GLUCOPHAGE) 1000 MG tablet TAKE 1 TABLET BY MOUTH TWICE DAILY WITH A MEAL 01/05/23   Gilmore Laroche, FNP  metoprolol tartrate (LOPRESSOR) 50 MG tablet Take 1 tablet (50 mg total) by  mouth 2 (two) times daily. 11/24/21   Jacquelin Hawking, PA-C  omeprazole (PRILOSEC) 40 MG capsule Take 1 capsule by mouth once daily 01/11/23   Gilmore Laroche, FNP  PROVENTIL HFA 108 (90 Base) MCG/ACT inhaler INHALE 2 PUFFS BY MOUTH EVERY 6 HOURS AS NEEDED FOR COUGHING, WHEEZING, OR SHORTNESS OF BREATH 03/26/21   Jacquelin Hawking, PA-C  triamcinolone cream (KENALOG) 0.1 % APPLY TO THE AFFECTED AREA TWICE DAILY AS NEEDED 03/26/21   Jacquelin Hawking, PA-C  Vitamin D, Ergocalciferol, (DRISDOL) 1.25 MG (50000 UNIT) CAPS capsule Take 1 capsule (50,000 Units total) by mouth every 7 (seven) days. 09/14/22   Gilmore Laroche, FNP     Allergies    Bee venom, Buspar [buspirone], Latex, Prednisone, Reglan [metoclopramide], and Sulfa antibiotics   Review of Systems   Review of Systems Please see HPI for pertinent positives and negatives  Physical Exam BP 111/67 (BP Location: Right Arm)   Pulse (!) 51   Temp 98.5 F (36.9 C) (Oral)   Resp 15   Ht 5\' 5"  (1.651 m)   Wt 79.8 kg   LMP 01/25/2023 (Exact Date)   SpO2 96%   BMI 29.29 kg/m   Physical Exam Vitals and nursing note reviewed.  Constitutional:      Appearance: Normal appearance.  HENT:     Head: Normocephalic and atraumatic.     Nose: Nose normal.     Mouth/Throat:  Mouth: Mucous membranes are moist.  Eyes:     Extraocular Movements: Extraocular movements intact.     Conjunctiva/sclera: Conjunctivae normal.  Cardiovascular:     Rate and Rhythm: Normal rate.  Pulmonary:     Effort: Pulmonary effort is normal.     Breath sounds: Normal breath sounds.  Abdominal:     General: Abdomen is flat.     Palpations: Abdomen is soft.     Tenderness: There is no abdominal tenderness.  Musculoskeletal:        General: No swelling. Normal range of motion.     Cervical back: Neck supple. Tenderness (diffuse cervical tenderness, midline and paraspinal) present.  Skin:    General: Skin is warm and dry.  Neurological:     General: No focal  deficit present.     Mental Status: She is alert.  Psychiatric:        Mood and Affect: Mood normal.     ED Results / Procedures / Treatments   EKG None  Procedures Procedures  Medications Ordered in the ED Medications  acetaminophen (TYLENOL) tablet 1,000 mg (1,000 mg Oral Given 02/02/23 0007)    Initial Impression and Plan  Patient with headache and neck pain after a fall about 10 hours ago. Will send for imaging. APAP for pain.   ED Course   Clinical Course as of 02/02/23 0137  Tue Feb 02, 2023  0110 I personally viewed the images from radiology studies and agree with radiologist interpretation: CT neg for acute injury. Patient advised APAP/Motrin for pain. PCP follow up, RTED for any other concerns.   [CS]    Clinical Course User Index [CS] Pollyann Savoy, MD     MDM Rules/Calculators/A&P Medical Decision Making Problems Addressed: Fall, initial encounter: acute illness or injury Injury of head, initial encounter: acute illness or injury Strain of neck muscle, initial encounter: acute illness or injury  Amount and/or Complexity of Data Reviewed Radiology: ordered and independent interpretation performed. Decision-making details documented in ED Course.  Risk OTC drugs.     Final Clinical Impression(s) / ED Diagnoses Final diagnoses:  Fall, initial encounter  Injury of head, initial encounter  Strain of neck muscle, initial encounter    Rx / DC Orders ED Discharge Orders     None        Pollyann Savoy, MD 02/02/23 [phone]

## 2023-02-03 ENCOUNTER — Other Ambulatory Visit: Payer: Self-pay | Admitting: Family Medicine

## 2023-02-03 ENCOUNTER — Telehealth: Payer: Self-pay

## 2023-02-03 DIAGNOSIS — E785 Hyperlipidemia, unspecified: Secondary | ICD-10-CM

## 2023-02-03 NOTE — Transitions of Care (Post Inpatient/ED Visit) (Signed)
   02/03/2023  Name: Shari Collins MRN: [id number] DOB: 09-22-1975  Today's TOC FU Call Status: Today's TOC FU Call Status:: Unsuccessul Call (1st Attempt) Unsuccessful Call (1st Attempt) Date: 02/03/23  Attempted to reach the patient regarding the most recent Inpatient/ED visit.  Follow Up Plan: Additional outreach attempts will be made to reach the patient to complete the Transitions of Care (Post Inpatient/ED visit) call.   Signature Elisha Ponder LPN [facility] AWV Team Direct dial:  [phone]

## 2023-03-09 ENCOUNTER — Encounter: Payer: Self-pay | Admitting: Internal Medicine

## 2023-03-09 ENCOUNTER — Ambulatory Visit (INDEPENDENT_AMBULATORY_CARE_PROVIDER_SITE_OTHER): Payer: Medicaid Other | Admitting: Internal Medicine

## 2023-03-09 VITALS — BP 119/72 | HR 64 | Temp 97.5°F | Ht 65.0 in | Wt 178.8 lb

## 2023-03-09 DIAGNOSIS — R1319 Other dysphagia: Secondary | ICD-10-CM | POA: Diagnosis not present

## 2023-03-09 DIAGNOSIS — K219 Gastro-esophageal reflux disease without esophagitis: Secondary | ICD-10-CM | POA: Diagnosis not present

## 2023-03-09 MED ORDER — OMEPRAZOLE 40 MG PO CPDR
40.0000 mg | DELAYED_RELEASE_CAPSULE | Freq: Every day | ORAL | 11 refills | Status: DC
Start: 2023-03-09 — End: 2023-07-21

## 2023-03-09 NOTE — Patient Instructions (Addendum)
It was nice seeing you again today!  As discussed you have recurrent difficulty swallowing (dysphagia)  After discussion, we both decided to go ahead and proceed with upper endoscopy with dilation as appropriate (ASA 3)  New prescription for omeprazole 40 mg pill dispense 30 with 11 refills.  Take 130 minutes before breakfast  Adjustment of diabetes medications per protocol.

## 2023-03-09 NOTE — Progress Notes (Signed)
Primary Care Physician:  Gilmore Laroche, FNP Primary Gastroenterologist:  Dr. Jena Gauss  Pre-Procedure History & Physical: HPI:  Shari Collins is a 47 y.o. female here for further evaluation of recurrent esophageal dysphagia.  I saw this nice lady back in 2022 for dysphagia.  She underwent an EGD which was normal empirically dilated her esophagus this was associated with resolution of dysphagia.  She underwent cervical spine surgery 3 months after her dilation did very well with that until about 3 months ago when she started having similar symptoms for which she presented back in 2022.  Reflux not so well-controlled she has been off her medication for 2 months because she ran out previously taking omeprazole 40 mg daily was controlling her symptoms fairly well denies abdominal pain melena or rectal bleeding  Past Medical History:  Diagnosis Date   Anxiety    pt states she was diagnosed years ago   Asthma    Bipolar disorder (HCC)    pt states she was diagnosed years ago   Depression    Diabetes mellitus    Dysrhythmia    per patient; however cardiac monitor December 2021 without atrial fibrillation. She had isolated PACs and couplets, rare episodes of SVT consistent with atrial tachycardia.  Rare ventricular ectopy with isolated PVCs and couplets.   Hypercholesteremia    Hypertension    Noncompliance    Schizophrenia (HCC)    pt states she was diagnosed years ago    Past Surgical History:  Procedure Laterality Date   ANTERIOR CERVICAL DECOMP/DISCECTOMY FUSION N/A 05/06/2021   Procedure: ANTERIOR CERVICAL DECOMPRESSION FUSION CERVICAL FOUR-FIVE, CERVICAL FIVE-SIX;  Surgeon: Dawley, Alan Mulder, DO;  Location: [facility] OR;  Service: Neurosurgery;  Laterality: N/A;   CESAREAN SECTION     DILATION AND CURETTAGE OF UTERUS  1994   1994 and 2007   ESOPHAGOGASTRODUODENOSCOPY (EGD) WITH PROPOFOL N/A 01/30/2021   Procedure: ESOPHAGOGASTRODUODENOSCOPY (EGD) WITH PROPOFOL;  Surgeon: Corbin Ade, MD;   Location: [facility] ENDO SUITE;  Service: Endoscopy;  Laterality: N/A;  12:30pm   MALONEY DILATION N/A 01/30/2021   Procedure: Elease Hashimoto DILATION;  Surgeon: Corbin Ade, MD;  Location: [facility] ENDO SUITE;  Service: Endoscopy;  Laterality: N/A;   TONSILLECTOMY      Prior to Admission medications   Medication Sig Start Date End Date Taking? Authorizing Provider  atorvastatin (LIPITOR) 20 MG tablet TAKE 1 Tablet BY MOUTH ONCE EVERY DAY 04/22/22  Yes Gilmore Laroche, FNP  blood glucose meter kit and supplies Dispense based on patient and insurance preference. Use up to four times daily as directed. (FOR ICD-10 E10.9, E11.9). 10/07/22  Yes Gilmore Laroche, FNP  citalopram (CELEXA) 40 MG tablet Take 1 tablet (40 mg total) by mouth daily. 04/22/22  Yes Gilmore Laroche, FNP  diphenhydrAMINE (BENADRYL) 25 MG tablet Take 25 mg by mouth daily as needed for allergies.   Yes [provider]  fenofibrate (TRICOR) 145 MG tablet Take 1 tablet by mouth once daily 02/03/23  Yes Gilmore Laroche, FNP  glucose blood (ACCU-CHEK GUIDE) test strip Use as instructed 10/29/22  Yes Gilmore Laroche, FNP  ibuprofen (ADVIL) 800 MG tablet Take 800 mg by mouth every 8 (eight) hours as needed. 12/17/21  Yes [provider]  JARDIANCE 10 MG TABS tablet TAKE 1 TABLET BY MOUTH ONCE DAILY BEFORE BREAKFAST 12/15/22  Yes Gilmore Laroche, FNP  lisinopril (ZESTRIL) 10 MG tablet Take 1 tablet by mouth once daily 04/22/22  Yes Gilmore Laroche, FNP  metFORMIN (GLUCOPHAGE) 1000 MG  tablet TAKE 1 TABLET BY MOUTH TWICE DAILY WITH A MEAL 01/05/23  Yes Gilmore Laroche, FNP  metoprolol tartrate (LOPRESSOR) 50 MG tablet Take 1 tablet (50 mg total) by mouth 2 (two) times daily. 11/24/21  Yes Jacquelin Hawking, PA-C  omeprazole (PRILOSEC) 40 MG capsule Take 1 capsule by mouth once daily 01/11/23  Yes Zarwolo, Malachi Bonds, FNP  PROVENTIL HFA 108 (90 Base) MCG/ACT inhaler INHALE 2 PUFFS BY MOUTH EVERY 6 HOURS AS NEEDED FOR COUGHING, WHEEZING, OR SHORTNESS OF  BREATH 03/26/21  Yes Jacquelin Hawking, PA-C  triamcinolone cream (KENALOG) 0.1 % APPLY TO THE AFFECTED AREA TWICE DAILY AS NEEDED 03/26/21  Yes Jacquelin Hawking, PA-C  Vitamin D, Ergocalciferol, (DRISDOL) 1.25 MG (50000 UNIT) CAPS capsule Take 1 capsule (50,000 Units total) by mouth every 7 (seven) days. 09/14/22  Yes Gilmore Laroche, FNP    Allergies as of 03/09/2023 - Review Complete 03/09/2023  Allergen Reaction Noted   Bee venom Anaphylaxis and Swelling 07/04/2013   Buspar [buspirone] Itching and Anxiety 08/10/2017   Latex Itching 09/21/2015   Prednisone  01/23/2021   Reglan [metoclopramide] Hives and Itching 04/12/2017   Sulfa antibiotics Itching and Rash 11/22/2011    Family History  Problem Relation Age of Onset   Hypertension Paternal Grandmother    Hypertension Maternal Grandmother    Diabetes Father    Hypertension Father    Cancer Mother        liver   Heart attack Paternal Grandfather    Diabetes Brother    Colon cancer Neg Hx     Social History   Socioeconomic History   Marital status: Single    Spouse name: Not on file   Number of children: Not on file   Years of education: Not on file   Highest education level: Not on file  Occupational History   Not on file  Tobacco Use   Smoking status: Every Day    Packs/day: 0.50    Years: 20.00    Additional pack years: 0.00    Total pack years: 10.00    Types: Cigarettes   Smokeless tobacco: Never  Vaping Use   Vaping Use: Never used  Substance and Sexual Activity   Alcohol use: Not Currently   Drug use: No   Sexual activity: Yes    Birth control/protection: None  Other Topics Concern   Not on file  Social History Narrative   Not on file   Social Determinants of Health   Financial Resource Strain: Not on file  Food Insecurity: Not on file  Transportation Needs: Not on file  Physical Activity: Not on file  Stress: Not on file  Social Connections: Not on file  Intimate Partner Violence: Not on file     Review of Systems: See HPI, otherwise negative ROS  Physical Exam: BP 119/72 (BP Location: Right Arm, Patient Position: Sitting, Cuff Size: Normal)   Pulse 64   Temp (!) 97.5 F (36.4 C) (Oral)   Ht 5\' 5"  (1.651 m)   Wt 178 lb 12.8 oz (81.1 kg)   LMP 02/14/2023 (Exact Date)   SpO2 97%   BMI 29.75 kg/m  General:   Alert, does not no acute distress.   Skin: No jaundice; multiple tattoos.   Neck:  Supple; no masses or thyromegaly. No significant cervical adenopathy.  Well-healed anterior cervical scar Lungs:  Clear throughout to auscultation.   No wheezes, crackles, or rhonchi. No acute distress. Heart:  Regular rate and rhythm; no murmurs, clicks, rubs,  or gallops.  Abdomen: Non-distended, normal bowel sounds.  Soft and nontender without appreciable mass or hepatosplenomegaly.  Impression/Plan: 48 year old lady with recurrent esophageal dysphagia had a normal esophagus 2022.  Responded nicely to dilation at that time.  Subsequently had cervical spine surgery -  did well for over a year until about 3 months ago.  Poorly controlled GERD in large part to coming off her PPI.  Recommendations: Further evaluation of dysphagia warranted.  We talked about going with a barium esophagram versus going directly to EGD.  After some discussion was decided the most expeditious approach we proceed directly with EGD with esophageal dilation is feasible/appropriate.  The risks, benefits, limitations, alternatives and imponderables have been reviewed with the patient. Potential for esophageal dilation, biopsy, etc. have also been reviewed.  Questions have been answered. All parties agreeable.   Diabetic medication will be adjusted per protocol.     Notice: This dictation was prepared with Dragon dictation along with smaller phrase technology. Any transcriptional errors that result from this process are unintentional and may not be corrected upon review.

## 2023-03-16 ENCOUNTER — Other Ambulatory Visit: Payer: Self-pay | Admitting: Family Medicine

## 2023-03-16 DIAGNOSIS — E1165 Type 2 diabetes mellitus with hyperglycemia: Secondary | ICD-10-CM

## 2023-03-16 DIAGNOSIS — F3289 Other specified depressive episodes: Secondary | ICD-10-CM

## 2023-03-23 ENCOUNTER — Telehealth: Payer: Self-pay | Admitting: *Deleted

## 2023-03-23 NOTE — Telephone Encounter (Signed)
LMOVM to call back to schedule EGD/ED with Dr. Jena Gauss, ASA 3. Will need ot hold jardiance x 3 days prior, metformin day of, clear liquids diet x 24 hrs

## 2023-04-04 ENCOUNTER — Emergency Department (HOSPITAL_COMMUNITY)
Admission: EM | Admit: 2023-04-04 | Discharge: 2023-04-04 | Disposition: A | Discharge status: Home / Self Care | Payer: Medicaid Other | Attending: Emergency Medicine | Admitting: Emergency Medicine

## 2023-04-04 ENCOUNTER — Encounter (HOSPITAL_COMMUNITY): Payer: Self-pay | Admitting: Emergency Medicine

## 2023-04-04 ENCOUNTER — Other Ambulatory Visit: Payer: Self-pay

## 2023-04-04 DIAGNOSIS — H9202 Otalgia, left ear: Secondary | ICD-10-CM | POA: Diagnosis present

## 2023-04-04 DIAGNOSIS — H60332 Swimmer's ear, left ear: Secondary | ICD-10-CM | POA: Insufficient documentation

## 2023-04-04 DIAGNOSIS — Z91040 Latex allergy status: Secondary | ICD-10-CM | POA: Diagnosis not present

## 2023-04-04 MED ORDER — CIPROFLOXACIN-DEXAMETHASONE 0.3-0.1 % OT SUSP: 4.0000 [drp] | Freq: Four times a day (QID) | OTIC | 0 refills | Status: DC

## 2023-04-04 NOTE — ED Provider Notes (Signed)
[place] EMERGENCY DEPARTMENT AT [facility] Provider Note   CSN: [id number] Arrival date & time: 04/04/23  1752     History  Chief Complaint  Patient presents with   Otalgia    Shari Collins is a 47 y.o. female.   Otalgia Associated symptoms: no fever    47 year old female complains of left-sided ear pain that started around 2:30 in the morning, she has some pain with touching the ear and laying on it, no changes in hearing, no fevers chills or upper respiratory symptoms.  She states she cleans her ears with Q-tips every day when she gets out of the shower    Home Medications Prior to Admission medications   Medication Sig Start Date End Date Taking? Authorizing Provider  ciprofloxacin-dexamethasone (CIPRODEX) OTIC suspension Place 4 drops into the left ear 4 (four) times daily. 04/04/23  Yes Eber Hong, MD  atorvastatin (LIPITOR) 20 MG tablet TAKE 1 Tablet BY MOUTH ONCE EVERY DAY 04/22/22   Gilmore Laroche, FNP  blood glucose meter kit and supplies Dispense based on patient and insurance preference. Use up to four times daily as directed. (FOR ICD-10 E10.9, E11.9). 10/07/22   Gilmore Laroche, FNP  citalopram (CELEXA) 40 MG tablet Take 1 tablet (40 mg total) by mouth daily. 04/22/22   Gilmore Laroche, FNP  diphenhydrAMINE (BENADRYL) 25 MG tablet Take 25 mg by mouth daily as needed for allergies.    [provider]  fenofibrate (TRICOR) 145 MG tablet Take 1 tablet by mouth once daily 02/03/23   Gilmore Laroche, FNP  glucose blood (ACCU-CHEK GUIDE) test strip Use as instructed 10/29/22   Gilmore Laroche, FNP  ibuprofen (ADVIL) 800 MG tablet Take 800 mg by mouth every 8 (eight) hours as needed. 12/17/21   [provider]  JARDIANCE 10 MG TABS tablet TAKE 1 TABLET BY MOUTH ONCE DAILY BEFORE BREAKFAST 12/15/22   Gilmore Laroche, FNP  lisinopril (ZESTRIL) 10 MG tablet Take 1 tablet by mouth once daily 04/22/22   Gilmore Laroche, FNP  metFORMIN (GLUCOPHAGE)  1000 MG tablet TAKE 1 TABLET BY MOUTH TWICE DAILY WITH A MEAL 01/05/23   Gilmore Laroche, FNP  metoprolol tartrate (LOPRESSOR) 50 MG tablet Take 1 tablet (50 mg total) by mouth 2 (two) times daily. 11/24/21   Jacquelin Hawking, PA-C  omeprazole (PRILOSEC) 40 MG capsule Take 1 capsule (40 mg total) by mouth daily. 03/09/23   Rourk, Gerrit Friends, MD  PROVENTIL HFA 108 [phone] Base) MCG/ACT inhaler INHALE 2 PUFFS BY MOUTH EVERY 6 HOURS AS NEEDED FOR COUGHING, WHEEZING, OR SHORTNESS OF BREATH 03/26/21   Jacquelin Hawking, PA-C  triamcinolone cream (KENALOG) 0.1 % APPLY TO THE AFFECTED AREA TWICE DAILY AS NEEDED 03/26/21   Jacquelin Hawking, PA-C  Vitamin D, Ergocalciferol, (DRISDOL) 1.25 MG (50000 UNIT) CAPS capsule Take 1 capsule (50,000 Units total) by mouth every 7 (seven) days. 09/14/22   Gilmore Laroche, FNP      Allergies    Bee venom, Buspar [buspirone], Latex, Prednisone, Reglan [metoclopramide], and Sulfa antibiotics    Review of Systems   Review of Systems  Constitutional:  Negative for fever.  HENT:  Positive for ear pain.     Physical Exam Updated Vital Signs BP 126/70 (BP Location: Right Arm)   Pulse 63   Temp 97.9 F (36.6 C)   Resp 17   Ht 1.651 m (5\' 5" )   Wt 80.7 kg   LMP 03/28/2023 (Exact Date)   SpO2 97%   BMI 29.62 kg/m  Physical Exam Vitals and nursing note reviewed.  Constitutional:      Appearance: She is well-developed. She is not diaphoretic.  HENT:     Head: Normocephalic and atraumatic.     Ears:     Comments: Right tympanic membrane and external auditory canal as well as the auricle and tragus are nontender and unremarkable, the left side has a normal tympanic membrane but there is tenderness with manipulation of the auricle and tragus and there is some edema of the external auditory canal though I am able to fully visualize the tympanic membrane and there is no significant swelling Eyes:     General:        Right eye: No discharge.        Left eye: No discharge.      Conjunctiva/sclera: Conjunctivae normal.  Pulmonary:     Effort: Pulmonary effort is normal. No respiratory distress.  Skin:    General: Skin is warm and dry.     Findings: No erythema or rash.  Neurological:     Mental Status: She is alert.     Coordination: Coordination normal.     ED Results / Procedures / Treatments   Labs (all labs ordered are listed, but only abnormal results are displayed) Labs Reviewed - No data to display  EKG None  Radiology No results found.  Procedures Procedures    Medications Ordered in ED Medications - No data to display  ED Course/ Medical Decision Making/ A&P                             Medical Decision Making  Otitis externa Normal vitals Well-appearing, instructions given, patient expressed understanding Topical drops        Final Clinical Impression(s) / ED Diagnoses Final diagnoses:  Acute swimmer's ear of left side    Rx / DC Orders ED Discharge Orders          Ordered    ciprofloxacin-dexamethasone (CIPRODEX) OTIC suspension  4 times daily        04/04/23 1832              Eber Hong, MD 04/04/23 [phone]

## 2023-04-04 NOTE — Discharge Instructions (Addendum)
Can definitely be handled at your family doctor or the urgent care in the future  Use the drops as prescribed  Stop using Q-tips so often  See your doctor if not getting any better

## 2023-04-04 NOTE — ED Triage Notes (Signed)
Pt complains of left ear pain that woke her from sleep at 230 this morning. Pt states yellow fluid draining from ear and difficulty hearing. Pt denies using qtip.

## 2023-04-05 ENCOUNTER — Other Ambulatory Visit: Payer: Self-pay | Admitting: Family Medicine

## 2023-04-05 DIAGNOSIS — E1165 Type 2 diabetes mellitus with hyperglycemia: Secondary | ICD-10-CM

## 2023-04-16 NOTE — Telephone Encounter (Signed)
LMOVM to call back 

## 2023-04-28 ENCOUNTER — Other Ambulatory Visit: Payer: Self-pay | Admitting: Family Medicine

## 2023-04-28 DIAGNOSIS — E785 Hyperlipidemia, unspecified: Secondary | ICD-10-CM

## 2023-04-28 DIAGNOSIS — F3289 Other specified depressive episodes: Secondary | ICD-10-CM

## 2023-04-28 DIAGNOSIS — I10 Essential (primary) hypertension: Secondary | ICD-10-CM

## 2023-05-26 ENCOUNTER — Ambulatory Visit: Payer: Medicaid Other | Admitting: Family Medicine

## 2023-05-27 ENCOUNTER — Encounter: Payer: Self-pay | Admitting: Family Medicine

## 2023-06-02 ENCOUNTER — Other Ambulatory Visit: Payer: Self-pay | Admitting: Family Medicine

## 2023-06-02 DIAGNOSIS — E1165 Type 2 diabetes mellitus with hyperglycemia: Secondary | ICD-10-CM

## 2023-06-02 DIAGNOSIS — E785 Hyperlipidemia, unspecified: Secondary | ICD-10-CM

## 2023-06-02 DIAGNOSIS — R7301 Impaired fasting glucose: Secondary | ICD-10-CM

## 2023-06-14 ENCOUNTER — Emergency Department (HOSPITAL_COMMUNITY)
Admission: EM | Admit: 2023-06-14 | Discharge: 2023-06-14 | Disposition: A | Discharge status: Home / Self Care | Payer: Medicaid Other | Attending: Emergency Medicine | Admitting: Emergency Medicine

## 2023-06-14 ENCOUNTER — Other Ambulatory Visit: Payer: Self-pay

## 2023-06-14 ENCOUNTER — Encounter (HOSPITAL_COMMUNITY): Payer: Self-pay | Admitting: *Deleted

## 2023-06-14 DIAGNOSIS — L03211 Cellulitis of face: Secondary | ICD-10-CM | POA: Diagnosis present

## 2023-06-14 DIAGNOSIS — Z91040 Latex allergy status: Secondary | ICD-10-CM | POA: Diagnosis not present

## 2023-06-14 DIAGNOSIS — L089 Local infection of the skin and subcutaneous tissue, unspecified: Secondary | ICD-10-CM

## 2023-06-14 DIAGNOSIS — Z7984 Long term (current) use of oral hypoglycemic drugs: Secondary | ICD-10-CM | POA: Insufficient documentation

## 2023-06-14 DIAGNOSIS — R739 Hyperglycemia, unspecified: Secondary | ICD-10-CM

## 2023-06-14 DIAGNOSIS — E1165 Type 2 diabetes mellitus with hyperglycemia: Secondary | ICD-10-CM | POA: Insufficient documentation

## 2023-06-14 LAB — CBG MONITORING, ED: Glucose-Capillary: 201 mg/dL — ABNORMAL HIGH (ref 70–99)

## 2023-06-14 MED ORDER — IBUPROFEN 400 MG PO TABS
400.0000 mg | ORAL_TABLET | Freq: Once | ORAL | Status: AC
  Administered 2023-06-14: 400 mg via ORAL
  Filled 2023-06-14: qty 1

## 2023-06-14 MED ORDER — IBUPROFEN 600 MG PO TABS: 600.0000 mg | ORAL_TABLET | Freq: Three times a day (TID) | ORAL | 0 refills | Status: DC

## 2023-06-14 MED ORDER — DOXYCYCLINE HYCLATE 100 MG PO CAPS: 100.0000 mg | ORAL_CAPSULE | Freq: Two times a day (BID) | ORAL | 0 refills | Status: DC

## 2023-06-14 MED ORDER — DOXYCYCLINE HYCLATE 100 MG PO TABS
100.0000 mg | ORAL_TABLET | Freq: Once | ORAL | Status: AC
  Administered 2023-06-14: 100 mg via ORAL
  Filled 2023-06-14: qty 1

## 2023-06-14 NOTE — ED Provider Notes (Signed)
[place] EMERGENCY DEPARTMENT AT [facility] Provider Note   CSN: [id number] Arrival date & time: 06/14/23  1117     History  Chief Complaint  Patient presents with   Abscess    Shari Collins is a 47 y.o. female with a history of diet-controlled diabetes, history of multiple abscesses including breast, axilla and groin area, no known history of MRSA presenting with a pimple on her right cheek which has become larger and painful.  This started 5 days ago.  She denies drainage from the site, fevers or chills, no nausea or vomiting.  She denies dental pain or mouth swelling or injury.  Pain does radiate into her right ear, no hearing changes, no ear drainage.  She has had no treatment for her symptoms that have been effective although has tried both ice and heat therapy.  The history is provided by the patient.       Home Medications Prior to Admission medications   Medication Sig Start Date End Date Taking? Authorizing Provider  doxycycline (VIBRAMYCIN) 100 MG capsule Take 1 capsule (100 mg total) by mouth 2 (two) times daily. 06/14/23  Yes Jarrod Bodkins, Raynelle Fanning, PA-C  ibuprofen (ADVIL) 600 MG tablet Take 1 tablet (600 mg total) by mouth 3 (three) times daily. 06/14/23  Yes Costella Schwarz, Raynelle Fanning, PA-C  atorvastatin (LIPITOR) 20 MG tablet TAKE 1 Tablet BY MOUTH ONCE EVERY DAY 04/22/22   Gilmore Laroche, FNP  blood glucose meter kit and supplies Dispense based on patient and insurance preference. Use up to four times daily as directed. (FOR ICD-10 E10.9, E11.9). 10/07/22   Gilmore Laroche, FNP  ciprofloxacin-dexamethasone (CIPRODEX) OTIC suspension Place 4 drops into the left ear 4 (four) times daily. 04/04/23   Eber Hong, MD  citalopram (CELEXA) 40 MG tablet Take 1 tablet (40 mg total) by mouth daily. 04/22/22   Gilmore Laroche, FNP  diphenhydrAMINE (BENADRYL) 25 MG tablet Take 25 mg by mouth daily as needed for allergies.    [provider]  fenofibrate (TRICOR) 145 MG tablet Take 1  tablet by mouth once daily 02/03/23   Gilmore Laroche, FNP  glucose blood (ACCU-CHEK GUIDE) test strip Use as instructed 10/29/22   Gilmore Laroche, FNP  JARDIANCE 10 MG TABS tablet TAKE 1 TABLET BY MOUTH ONCE DAILY BEFORE BREAKFAST 04/05/23   Gilmore Laroche, FNP  lisinopril (ZESTRIL) 10 MG tablet Take 1 tablet by mouth once daily 04/22/22   Gilmore Laroche, FNP  metFORMIN (GLUCOPHAGE) 1000 MG tablet TAKE 1 TABLET BY MOUTH TWICE DAILY WITH A MEAL 01/05/23   Gilmore Laroche, FNP  metoprolol tartrate (LOPRESSOR) 50 MG tablet Take 1 tablet (50 mg total) by mouth 2 (two) times daily. 11/24/21   Jacquelin Hawking, PA-C  omeprazole (PRILOSEC) 40 MG capsule Take 1 capsule (40 mg total) by mouth daily. 03/09/23   Rourk, Gerrit Friends, MD  PROVENTIL HFA [phone] Base) MCG/ACT inhaler INHALE 2 PUFFS BY MOUTH EVERY 6 HOURS AS NEEDED FOR COUGHING, WHEEZING, OR SHORTNESS OF BREATH 03/26/21   Jacquelin Hawking, PA-C  triamcinolone cream (KENALOG) 0.1 % APPLY TO THE AFFECTED AREA TWICE DAILY AS NEEDED 03/26/21   Jacquelin Hawking, PA-C  Vitamin D, Ergocalciferol, (DRISDOL) 1.25 MG (50000 UNIT) CAPS capsule Take 1 capsule (50,000 Units total) by mouth every 7 (seven) days. 09/14/22   Gilmore Laroche, FNP      Allergies    Bee venom, Buspar [buspirone], Latex, Prednisone, Reglan [metoclopramide], and Sulfa antibiotics    Review of Systems   Review of Systems  Constitutional:  Negative for chills and fever.  HENT:  Positive for ear pain and facial swelling. Negative for congestion, ear discharge and sore throat.   Eyes: Negative.   Respiratory:  Negative for chest tightness and shortness of breath.   Cardiovascular:  Negative for chest pain.  Gastrointestinal:  Negative for nausea.  Genitourinary: Negative.   Musculoskeletal:  Negative for arthralgias, joint swelling and neck pain.  Skin:  Positive for color change. Negative for rash and wound.  Neurological:  Negative for dizziness, weakness, light-headedness, numbness and  headaches.  Psychiatric/Behavioral: Negative.      Physical Exam Updated Vital Signs BP 134/84 (BP Location: Right Arm)   Pulse 70   Temp 98.2 F (36.8 C) (Oral)   Resp 16   Ht 5\' 5"  (1.651 m)   Wt 84.8 kg   LMP 06/12/2023   SpO2 98%   BMI 31.12 kg/m  Physical Exam Vitals and nursing note reviewed.  Constitutional:      Appearance: She is well-developed.  HENT:     Head: Atraumatic.     Comments: 1 cm indurated papule right cheek/maxilla region,no red streaking, fluctuance,  no expanding erythema, no fluctuance.  Central scab present,no drainage.    Right Ear: Tympanic membrane and ear canal normal.     Left Ear: Tympanic membrane and ear canal normal.     Mouth/Throat:     Mouth: Mucous membranes are moist.     Dentition: No dental abscesses.  Eyes:     Conjunctiva/sclera: Conjunctivae normal.  Cardiovascular:     Rate and Rhythm: Normal rate and regular rhythm.     Heart sounds: Normal heart sounds.  Pulmonary:     Effort: Pulmonary effort is normal.  Musculoskeletal:        General: Normal range of motion.     Cervical back: Normal range of motion.  Skin:    General: Skin is warm and dry.  Neurological:     Mental Status: She is alert.     ED Results / Procedures / Treatments   Labs (all labs ordered are listed, but only abnormal results are displayed) Labs Reviewed  CBG MONITORING, ED - Abnormal; Notable for the following components:      Result Value   Glucose-Capillary 201 (*)    All other components within normal limits    EKG None  Radiology No results found.  Procedures Procedures    Medications Ordered in ED Medications  doxycycline (VIBRA-TABS) tablet 100 mg (has no administration in time range)  ibuprofen (ADVIL) tablet 400 mg (has no administration in time range)    ED Course/ Medical Decision Making/ A&P                                 Medical Decision Making Patient with a facial skin infection, there is no palpable  fluctuance or drainable facial abscess.  The site is indurated, there is no red streaking or significant facial cellulitis, although the indurated 1 cm area itself is modestly erythematous.  We discussed home treatments, advised discontinuing ice therapy, would use heat but also sparingly as well 10 minutes not more than several times daily.  She was started on doxycycline with first dose given here.  Return precautions were outlined.  There is no exam findings to suggest.  Purulent abscess pocket, she has no dental findings suggesting this is primarily a dental infection.  Final Clinical Impression(s) / ED Diagnoses Final diagnoses:  Facial infection  Hyperglycemia    Rx / DC Orders ED Discharge Orders          Ordered    ibuprofen (ADVIL) 600 MG tablet  3 times daily        06/14/23 1239    doxycycline (VIBRAMYCIN) 100 MG capsule  2 times daily        06/14/23 1239              Burgess Amor, PA-C 06/14/23 1243    Loetta Rough, MD 06/14/23 1545

## 2023-06-14 NOTE — Discharge Instructions (Signed)
Take the entire course of the antibiotics prescribed, you next dose taken this evening.  I do recommend a gentle heating pad for 10 minutes 3-4 times daily which may actually help this infection resolve quicker.  There is no indication that you need to have this lanced today, however if you develop increasing swelling or fluctuance we may need to consider doing this at that time.

## 2023-06-14 NOTE — ED Triage Notes (Signed)
Pt with abscess to right cheek x 5 days.  Pt states she feels a lump to right neck and HA. Denies any fevers or N/V.

## 2023-07-08 ENCOUNTER — Other Ambulatory Visit: Payer: Self-pay

## 2023-07-19 ENCOUNTER — Emergency Department (HOSPITAL_COMMUNITY)
Admission: EM | Admit: 2023-07-19 | Discharge: 2023-07-19 | Disposition: A | Discharge status: Home / Self Care | Payer: Medicaid Other | Attending: Emergency Medicine | Admitting: Emergency Medicine

## 2023-07-19 ENCOUNTER — Other Ambulatory Visit: Payer: Self-pay

## 2023-07-19 ENCOUNTER — Emergency Department (HOSPITAL_COMMUNITY): Payer: Medicaid Other

## 2023-07-19 ENCOUNTER — Encounter (HOSPITAL_COMMUNITY): Payer: Self-pay

## 2023-07-19 DIAGNOSIS — Z7984 Long term (current) use of oral hypoglycemic drugs: Secondary | ICD-10-CM | POA: Insufficient documentation

## 2023-07-19 DIAGNOSIS — Z91040 Latex allergy status: Secondary | ICD-10-CM | POA: Insufficient documentation

## 2023-07-19 DIAGNOSIS — E119 Type 2 diabetes mellitus without complications: Secondary | ICD-10-CM | POA: Insufficient documentation

## 2023-07-19 DIAGNOSIS — I10 Essential (primary) hypertension: Secondary | ICD-10-CM | POA: Diagnosis not present

## 2023-07-19 DIAGNOSIS — Z79899 Other long term (current) drug therapy: Secondary | ICD-10-CM | POA: Insufficient documentation

## 2023-07-19 DIAGNOSIS — M25562 Pain in left knee: Secondary | ICD-10-CM | POA: Diagnosis present

## 2023-07-19 MED ORDER — OXYCODONE-ACETAMINOPHEN 5-325 MG PO TABS
1.0000 | ORAL_TABLET | Freq: Once | ORAL | Status: AC
  Administered 2023-07-19: 1 via ORAL
  Filled 2023-07-19: qty 1

## 2023-07-19 NOTE — ED Provider Notes (Signed)
[place] EMERGENCY DEPARTMENT AT [facility] Provider Note   CSN: [id number] Arrival date & time: 07/19/23  1518     History  Chief Complaint  Patient presents with   Knee Pain    Shari Collins is a 47 y.o. female with a history of schizophrenia, diabetes mellitus, and hypertension who presents to the ED today for knee pain.  Patient reports she was at work when she felt her left knee "pop."  Since then, she has been having pain to the lateral and posterior left knee.  She is still able to ambulate her left leg.  Denies injury or trauma. She has not taken anything for pain prior to arrival. No other concerns or complaints at this time.    Home Medications Prior to Admission medications   Medication Sig Start Date End Date Taking? Authorizing Provider  atorvastatin (LIPITOR) 20 MG tablet TAKE 1 Tablet BY MOUTH ONCE EVERY DAY 04/22/22   Gilmore Laroche, FNP  blood glucose meter kit and supplies Dispense based on patient and insurance preference. Use up to four times daily as directed. (FOR ICD-10 E10.9, E11.9). 10/07/22   Gilmore Laroche, FNP  ciprofloxacin-dexamethasone (CIPRODEX) OTIC suspension Place 4 drops into the left ear 4 (four) times daily. 04/04/23   Eber Hong, MD  citalopram (CELEXA) 40 MG tablet Take 1 tablet (40 mg total) by mouth daily. 04/22/22   Gilmore Laroche, FNP  diphenhydrAMINE (BENADRYL) 25 MG tablet Take 25 mg by mouth daily as needed for allergies.    [provider]  doxycycline (VIBRAMYCIN) 100 MG capsule Take 1 capsule (100 mg total) by mouth 2 (two) times daily. 06/14/23   Burgess Amor, PA-C  fenofibrate (TRICOR) 145 MG tablet Take 1 tablet by mouth once daily 02/03/23   Gilmore Laroche, FNP  glucose blood (ACCU-CHEK GUIDE) test strip Use as instructed 10/29/22   Gilmore Laroche, FNP  ibuprofen (ADVIL) 600 MG tablet Take 1 tablet (600 mg total) by mouth 3 (three) times daily. 06/14/23   Burgess Amor, PA-C  JARDIANCE 10 MG TABS tablet TAKE 1  TABLET BY MOUTH ONCE DAILY BEFORE BREAKFAST 04/05/23   Gilmore Laroche, FNP  lisinopril (ZESTRIL) 10 MG tablet Take 1 tablet by mouth once daily 04/22/22   Gilmore Laroche, FNP  metFORMIN (GLUCOPHAGE) 1000 MG tablet TAKE 1 TABLET BY MOUTH TWICE DAILY WITH A MEAL 01/05/23   Gilmore Laroche, FNP  metoprolol tartrate (LOPRESSOR) 50 MG tablet Take 1 tablet (50 mg total) by mouth 2 (two) times daily. 11/24/21   Jacquelin Hawking, PA-C  omeprazole (PRILOSEC) 40 MG capsule Take 1 capsule (40 mg total) by mouth daily. 03/09/23   Rourk, Gerrit Friends, MD  PROVENTIL HFA 108 [phone] Base) MCG/ACT inhaler INHALE 2 PUFFS BY MOUTH EVERY 6 HOURS AS NEEDED FOR COUGHING, WHEEZING, OR SHORTNESS OF BREATH 03/26/21   Jacquelin Hawking, PA-C  triamcinolone cream (KENALOG) 0.1 % APPLY TO THE AFFECTED AREA TWICE DAILY AS NEEDED 03/26/21   Jacquelin Hawking, PA-C  Vitamin D, Ergocalciferol, (DRISDOL) 1.25 MG (50000 UNIT) CAPS capsule Take 1 capsule (50,000 Units total) by mouth every 7 (seven) days. 09/14/22   Gilmore Laroche, FNP      Allergies    Bee venom, Buspar [buspirone], Latex, Prednisone, Reglan [metoclopramide], and Sulfa antibiotics    Review of Systems   Review of Systems  Musculoskeletal:        Left knee pain  All other systems reviewed and are negative.   Physical Exam Updated Vital Signs BP 126/80  Pulse 78   Temp 97.7 F (36.5 C) (Oral)   Resp 18   Ht 5\' 5"  (1.651 m)   Wt 84.8 kg   SpO2 93%   BMI 31.12 kg/m  Physical Exam Vitals and nursing note reviewed.  Constitutional:      General: She is not in acute distress.    Appearance: Normal appearance.  HENT:     Head: Normocephalic and atraumatic.     Mouth/Throat:     Mouth: Mucous membranes are moist.  Eyes:     Conjunctiva/sclera: Conjunctivae normal.     Pupils: Pupils are equal, round, and reactive to light.  Cardiovascular:     Rate and Rhythm: Normal rate and regular rhythm.     Pulses: Normal pulses.     Heart sounds: Normal heart sounds.   Pulmonary:     Effort: Pulmonary effort is normal.     Breath sounds: Normal breath sounds.  Musculoskeletal:        General: Swelling and tenderness present. Normal range of motion.     Right lower leg: No edema.     Left lower leg: No edema.     Comments: Tenderness to palpation of the left lateral and posterior knee with swelling present only at the anterolateral aspect. No erythema or warmth to touch.  Full range of motion of the left knee appreciated.  Palpable dorsalis pedis pulse.  Skin:    General: Skin is warm and dry.     Findings: No rash.  Neurological:     General: No focal deficit present.     Mental Status: She is alert.     Sensory: No sensory deficit.     Motor: No weakness.  Psychiatric:        Mood and Affect: Mood normal.        Behavior: Behavior normal.    ED Results / Procedures / Treatments   Labs (all labs ordered are listed, but only abnormal results are displayed) Labs Reviewed - No data to display  EKG None  Radiology DG Knee Left Port  Result Date: 07/19/2023 CLINICAL DATA:  Left knee pain.  Felt knee pop at work. EXAM: PORTABLE LEFT KNEE - 1-2 VIEW COMPARISON:  None Available. FINDINGS: Mild lateral compartment joint space narrowing and peripheral osteophytosis. Mild peripheral medial component degenerative osteophytosis without significant joint space narrowing. Severe patellofemoral joint space narrowing with bone-on-bone contact. Overall corticated chronic 3.1 cm loose body within the lateral aspect of the suprapatellar joint space. Minimal joint effusion. There are two adjacent ossicles at the posterior aspect of the medial knee compartment measuring up to 10 mm. No acute fracture is seen. No dislocation. Varicose veins are noted. IMPRESSION: 1. Severe patellofemoral osteoarthritis. 2. Mild lateral compartment osteoarthritis. 3. Chronic 3.1 cm loose body within the lateral aspect of the suprapatellar joint space. Small loose bodies within the  posterior aspect of the medial compartment. Electronically Signed   By: Neita Garnet M.D.   On: 07/19/2023 17:37    Procedures Procedures: not indicated.   Medications Ordered in ED Medications  oxyCODONE-acetaminophen (PERCOCET/ROXICET) 5-325 MG per tablet 1 tablet (1 tablet Oral Given 07/19/23 1729)    ED Course/ Medical Decision Making/ A&P                                 Medical Decision Making Amount and/or Complexity of Data Reviewed Radiology: ordered.  Risk Prescription drug management.  This patient presents to the ED for concern of left knee pain, this involves an extensive number of treatment options, and is a complaint that carries with it a high risk of complications and morbidity.   Differential diagnosis includes: fracture, dislocation, arthritis, ligamentous injury, muscle strain, etc. Low suspicion for septic arthritis - patient able to ambulate normally, full range of motion intact, no erythema, no warmth to touch   Comorbidities  See HPI above   Additional History  Additional history obtained from prior records.   Imaging Studies  I ordered imaging studies including left knee x-ray.  I independently visualized and interpreted imaging which showed: No acute fracture or dislocation.  Severe patellofemoral osteoarthritis.  Mild lateral compartment osteoarthritis.  Chronic 3.1 cm loose body within the lateral aspect of the suprapatellar joint space.  Small loose bodies within the posterior aspect of the medial compartment. I agree with the radiologist interpretation   Problem List / ED Course / Critical Interventions / Medication Management  Left knee pain I ordered medications including: Percocet for pain  Reevaluation of the patient after these medicines showed that the patient improved I have reviewed the patients home medicines and have made adjustments as needed Ace bandage was provided prior to discharge   Social Determinants of  Health  Occupation    Test / Admission - Considered  Discussed findings with patient. She is hemodynamically stable and safe for discharge home. Return precautions provided.       Final Clinical Impression(s) / ED Diagnoses Final diagnoses:  Acute pain of left knee    Rx / DC Orders ED Discharge Orders     None         Maxwell Marion, PA-C 07/19/23 1857    Bethann Berkshire, MD 07/20/23 1259

## 2023-07-19 NOTE — ED Notes (Signed)
Stepmother is ride home Medicated per MAR  8/10

## 2023-07-19 NOTE — ED Notes (Signed)
Ice pack applied Warm blanket given

## 2023-07-19 NOTE — ED Notes (Signed)
Pt able to walk from VT1 to FT 23 Pt stated that her LEFT knee pops in and out It has been years since it has happened Denies any falls or trauma Complains of numbness in her toes  XRAY already completed after triage Pt waiting on provider exam and XRAY results

## 2023-07-19 NOTE — ED Triage Notes (Signed)
Pt to er, pt states that she was at work and felt her knee pop, pt states that she is currently having L knee pain.  Pt ambulatory to triage booth

## 2023-07-19 NOTE — Discharge Instructions (Signed)
As discussed, your imaging showed a chronic loose bone fragment at the lateral left knee but did not show fracture or dislocation.  Please follow-up with orthopedics for further evaluation and management.  Wear the Ace wrap as needed for pain and swelling.  You can alternate between ibuprofen and Tylenol every 4 hours as needed for pain.  Get help right away if: Your knee swells, and the swelling gets worse. You cannot move your knee. You have very bad knee pain that does not get better with pain medicine.

## 2023-07-21 ENCOUNTER — Encounter: Payer: Self-pay | Admitting: Family Medicine

## 2023-07-21 ENCOUNTER — Telehealth (INDEPENDENT_AMBULATORY_CARE_PROVIDER_SITE_OTHER): Payer: Medicaid Other | Admitting: Family Medicine

## 2023-07-21 VITALS — Ht 65.0 in | Wt 189.0 lb

## 2023-07-21 DIAGNOSIS — E1165 Type 2 diabetes mellitus with hyperglycemia: Secondary | ICD-10-CM

## 2023-07-21 DIAGNOSIS — F32A Depression, unspecified: Secondary | ICD-10-CM

## 2023-07-21 DIAGNOSIS — E038 Other specified hypothyroidism: Secondary | ICD-10-CM

## 2023-07-21 DIAGNOSIS — F419 Anxiety disorder, unspecified: Secondary | ICD-10-CM

## 2023-07-21 DIAGNOSIS — E785 Hyperlipidemia, unspecified: Secondary | ICD-10-CM | POA: Diagnosis not present

## 2023-07-21 DIAGNOSIS — R002 Palpitations: Secondary | ICD-10-CM | POA: Diagnosis not present

## 2023-07-21 DIAGNOSIS — E569 Vitamin deficiency, unspecified: Secondary | ICD-10-CM

## 2023-07-21 DIAGNOSIS — Z7984 Long term (current) use of oral hypoglycemic drugs: Secondary | ICD-10-CM

## 2023-07-21 DIAGNOSIS — I10 Essential (primary) hypertension: Secondary | ICD-10-CM

## 2023-07-21 DIAGNOSIS — K219 Gastro-esophageal reflux disease without esophagitis: Secondary | ICD-10-CM

## 2023-07-21 DIAGNOSIS — Z1211 Encounter for screening for malignant neoplasm of colon: Secondary | ICD-10-CM

## 2023-07-21 DIAGNOSIS — R7301 Impaired fasting glucose: Secondary | ICD-10-CM

## 2023-07-21 MED ORDER — METFORMIN HCL 1000 MG PO TABS: 1000.0000 mg | ORAL_TABLET | Freq: Two times a day (BID) | ORAL | 0 refills | Status: DC

## 2023-07-21 MED ORDER — OMEPRAZOLE 40 MG PO CPDR
40.0000 mg | DELAYED_RELEASE_CAPSULE | Freq: Every day | ORAL | 1 refills | Status: AC
Start: 2023-07-21 — End: ?

## 2023-07-21 MED ORDER — EMPAGLIFLOZIN 10 MG PO TABS: 10.0000 mg | ORAL_TABLET | Freq: Every day | ORAL | 0 refills | Status: DC

## 2023-07-21 MED ORDER — VITAMIN D (ERGOCALCIFEROL) 1.25 MG (50000 UNIT) PO CAPS: 50000.0000 [IU] | ORAL_CAPSULE | ORAL | 1 refills | Status: AC

## 2023-07-21 MED ORDER — CITALOPRAM HYDROBROMIDE 40 MG PO TABS: 40.0000 mg | ORAL_TABLET | Freq: Every day | ORAL | 1 refills | Status: AC

## 2023-07-21 MED ORDER — FENOFIBRATE 145 MG PO TABS: 145.0000 mg | ORAL_TABLET | Freq: Every day | ORAL | 0 refills | Status: AC

## 2023-07-21 MED ORDER — ATORVASTATIN CALCIUM 20 MG PO TABS: ORAL_TABLET | ORAL | 2 refills | Status: AC

## 2023-07-21 MED ORDER — METOPROLOL TARTRATE 50 MG PO TABS: 50.0000 mg | ORAL_TABLET | Freq: Two times a day (BID) | ORAL | 0 refills | Status: AC

## 2023-07-21 MED ORDER — LISINOPRIL 10 MG PO TABS: ORAL_TABLET | ORAL | 2 refills | Status: DC

## 2023-07-21 NOTE — Progress Notes (Signed)
Virtual Visit via Video Note  I connected with Shari Collins on 07/21/23 at  2:20 PM EST by a video enabled telemedicine application and verified that I am speaking with the correct person using two identifiers.  Patient Location: Home Provider Location: Office/Clinic  I discussed the limitations, risks, security, and privacy concerns of performing an evaluation and management service by video and the availability of in person appointments. I also discussed with the patient that there may be a patient responsible charge related to this service. The patient expressed understanding and agreed to proceed.  Subjective: PCP: Gilmore Laroche, FNP  Chief Complaint  Patient presents with   Medication Refill    Pt would like medication refills    HPI Palpitations: The patient reports increased palpitations over the last few weeks, noting an increase in heart fluttering. She also mentions heightened anxiety and life stressors. Although she is reluctant to share details, she becomes tearful when thinking about her situation. She denies symptoms of shortness of breath, chest tightness, or chest pain. I reviewed her chart, and she last saw her cardiologist for palpitations in 2022. She reports being out of her medication for the past 2 to 3 months. Will reinstate therapy with metoprolol today. Encouraged to follow up if she experiences symptoms of shortness of breath, chest tightness, orthopnea, lightheadedness, or dizziness with palpitations.  Generalized Anxiety Disorder: The patient reports increased life stressors but is reluctant to discuss specifics. She appears tearful during the video visit. She denies suicidal thoughts or ideation and is willing to speak with a counselor.    ROS: Per HPI  Current Outpatient Medications:    blood glucose meter kit and supplies, Dispense based on patient and insurance preference. Use up to four times daily as directed. (FOR ICD-10 E10.9, E11.9)., Disp: 1  each, Rfl: 0   diphenhydrAMINE (BENADRYL) 25 MG tablet, Take 25 mg by mouth daily as needed for allergies., Disp: , Rfl:    glucose blood (ACCU-CHEK GUIDE) test strip, Use as instructed, Disp: 100 each, Rfl: 0   PROVENTIL HFA 108 (90 Base) MCG/ACT inhaler, INHALE 2 PUFFS BY MOUTH EVERY 6 HOURS AS NEEDED FOR COUGHING, WHEEZING, OR SHORTNESS OF BREATH, Disp: 20.1 g, Rfl: 1   triamcinolone cream (KENALOG) 0.1 %, APPLY TO THE AFFECTED AREA TWICE DAILY AS NEEDED, Disp: 60 g, Rfl: 1   atorvastatin (LIPITOR) 20 MG tablet, TAKE 1 Tablet BY MOUTH ONCE EVERY DAY, Disp: 60 tablet, Rfl: 2   citalopram (CELEXA) 40 MG tablet, Take 1 tablet (40 mg total) by mouth daily., Disp: 90 tablet, Rfl: 1   empagliflozin (JARDIANCE) 10 MG TABS tablet, Take 1 tablet (10 mg total) by mouth daily before breakfast., Disp: 90 tablet, Rfl: 0   fenofibrate (TRICOR) 145 MG tablet, Take 1 tablet (145 mg total) by mouth daily., Disp: 90 tablet, Rfl: 0   lisinopril (ZESTRIL) 10 MG tablet, Take 1 tablet by mouth once daily, Disp: 90 tablet, Rfl: 2   metFORMIN (GLUCOPHAGE) 1000 MG tablet, Take 1 tablet (1,000 mg total) by mouth 2 (two) times daily with a meal., Disp: 180 tablet, Rfl: 0   metoprolol tartrate (LOPRESSOR) 50 MG tablet, Take 1 tablet (50 mg total) by mouth 2 (two) times daily., Disp: 60 tablet, Rfl: 0   omeprazole (PRILOSEC) 40 MG capsule, Take 1 capsule (40 mg total) by mouth daily., Disp: 90 capsule, Rfl: 1   Vitamin D, Ergocalciferol, (DRISDOL) 1.25 MG (50000 UNIT) CAPS capsule, Take 1 capsule (50,000 Units total) by  mouth every 7 (seven) days., Disp: 10 capsule, Rfl: 1  Observations/Objective: Today's Vitals   07/21/23 1351  Weight: 189 lb (85.7 kg)  Height: 5\' 5"  (1.651 m)  PainSc: 7    Physical Exam  Assessment and Plan: Palpitations Assessment & Plan: Will arrange a consult with Dr. Wyline Mood to see if he can possibly see the patient in the next 2 to 3 weeks. Refill metoprolol.     Anxiety and  depression Assessment & Plan: Refill Celexa 40 mg daily. Reviewed nonpharmacological management of anxiety and depression, including mindfulness, meditation, deep breathing exercises, ensuring adequate rest, and maintaining a well-balanced diet. Referral placed to integrated health for cognitive behavioral therapy.  Patient and/or legal guardian verbally consented to [facility] services about presenting concerns and psychiatric consultation as appropriate.  The services will be billed as appropriate for the patient   Orders: -     Citalopram Hydrobromide; Take 1 tablet (40 mg total) by mouth daily.  Dispense: 90 tablet; Refill: 1 -     Amb ref to Integrated Behavioral Health  Hyperlipidemia, unspecified hyperlipidemia type Assessment & Plan: Encouraged to continue taking fenofibrate 145 mg daily and atorvastatin 20 mg daily Refill sent Lab Results  Component Value Date   CHOL 148 09/10/2022   HDL 42 09/10/2022   LDLCALC 89 09/10/2022   TRIG 89 09/10/2022   CHOLHDL 3.5 09/10/2022     Orders: -     Atorvastatin Calcium; TAKE 1 Tablet BY MOUTH ONCE EVERY DAY  Dispense: 60 tablet; Refill: 2 -     Fenofibrate; Take 1 tablet (145 mg total) by mouth daily.  Dispense: 90 tablet; Refill: 0 -     Lipid panel -     CMP14+EGFR -     CBC with Differential/Platelet  Type 2 diabetes mellitus with hyperglycemia, unspecified whether long term insulin use (HCC) Assessment & Plan: refilled metformin 1000 mg twice daily  encouraged to decrease her intake of high sugar foods and beverages with increase physical activity  Lab Results  Component Value Date   HGBA1C 7.9 (H) 09/10/2022     Orders: -     Empagliflozin; Take 1 tablet (10 mg total) by mouth daily before breakfast.  Dispense: 90 tablet; Refill: 0 -     Hemoglobin A1c  Primary hypertension Assessment & Plan: Refilled sent BP Readings from Last 3 Encounters:  07/19/23 126/80  06/15/23 (!) 147/97   06/14/23 134/84     Orders: -     Lisinopril; Take 1 tablet by mouth once daily  Dispense: 90 tablet; Refill: 2  Gastroesophageal reflux disease, unspecified whether esophagitis present Assessment & Plan: Medications refilled GERD diet encouraged  Orders: -     Omeprazole; Take 1 capsule (40 mg total) by mouth daily.  Dispense: 90 capsule; Refill: 1  IFG (impaired fasting glucose) -     metFORMIN HCl; Take 1 tablet (1,000 mg total) by mouth 2 (two) times daily with a meal.  Dispense: 180 tablet; Refill: 0  Vitamin deficiency -     Vitamin D (Ergocalciferol); Take 1 capsule (50,000 Units total) by mouth every 7 (seven) days.  Dispense: 10 capsule; Refill: 1 -     VITAMIN D 25 Hydroxy (Vit-D Deficiency, Fractures)  Colon cancer screening -     Cologuard  TSH (thyroid-stimulating hormone deficiency) -     TSH + free T4  Other orders -     Metoprolol Tartrate; Take 1 tablet (50 mg total) by mouth 2 (two)  times daily.  Dispense: 60 tablet; Refill: 0  Note: This chart has been completed using Engineer, civil (consulting) software, and while attempts have been made to ensure accuracy, certain words and phrases may not be transcribed as intended.    Follow Up Instructions: Return in about 3 months (around 10/21/2023).   I discussed the assessment and treatment plan with the patient. The patient was provided an opportunity to ask questions, and all were answered. The patient agreed with the plan and demonstrated an understanding of the instructions.   The patient was advised to call back or seek an in-person evaluation if the symptoms worsen or if the condition fails to improve as anticipated.  The above assessment and management plan was discussed with the patient. The patient verbalized understanding of and has agreed to the management plan.   Gilmore Laroche, FNP

## 2023-07-21 NOTE — Assessment & Plan Note (Signed)
Will arrange a consult with Dr. Wyline Mood to see if he can possibly see the patient in the next 2 to 3 weeks. Refill metoprolol.

## 2023-07-21 NOTE — Assessment & Plan Note (Addendum)
Refill Celexa 40 mg daily. Reviewed nonpharmacological management of anxiety and depression, including mindfulness, meditation, deep breathing exercises, ensuring adequate rest, and maintaining a well-balanced diet. Referral placed to integrated health for cognitive behavioral therapy.  Patient and/or legal guardian verbally consented to [facility] services about presenting concerns and psychiatric consultation as appropriate.  The services will be billed as appropriate for the patient

## 2023-07-21 NOTE — Assessment & Plan Note (Signed)
Encouraged to continue taking fenofibrate 145 mg daily and atorvastatin 20 mg daily Refill sent Lab Results  Component Value Date   CHOL 148 09/10/2022   HDL 42 09/10/2022   LDLCALC 89 09/10/2022   TRIG 89 09/10/2022   CHOLHDL 3.5 09/10/2022

## 2023-07-21 NOTE — Assessment & Plan Note (Signed)
refilled metformin 1000 mg twice daily  encouraged to decrease her intake of high sugar foods and beverages with increase physical activity  Lab Results  Component Value Date   HGBA1C 7.9 (H) 09/10/2022

## 2023-07-21 NOTE — Assessment & Plan Note (Signed)
Refilled sent BP Readings from Last 3 Encounters:  07/19/23 126/80  06/15/23 (!) 147/97  06/14/23 134/84

## 2023-07-21 NOTE — Assessment & Plan Note (Addendum)
Medications refilled GERD diet encouraged

## 2023-08-10 ENCOUNTER — Ambulatory Visit: Payer: Medicaid Other | Admitting: Cardiology

## 2023-08-16 ENCOUNTER — Telehealth: Payer: Self-pay | Admitting: Licensed Clinical Social Worker

## 2023-08-16 NOTE — Telephone Encounter (Signed)
[facility] contacted patient on this date to provide [facility] intro and to schedule [facility] appointment. Appointment scheduled for 08/23/2023.

## 2023-08-23 ENCOUNTER — Ambulatory Visit (INDEPENDENT_AMBULATORY_CARE_PROVIDER_SITE_OTHER): Payer: PRIVATE HEALTH INSURANCE | Admitting: Licensed Clinical Social Worker

## 2023-08-23 DIAGNOSIS — F4323 Adjustment disorder with mixed anxiety and depressed mood: Secondary | ICD-10-CM | POA: Diagnosis not present

## 2023-08-23 NOTE — BH Specialist Note (Unsigned)
Integrated Behavioral Health via Telemedicine Visit  08/25/2023 Shari Collins [id number]  Number of Integrated Behavioral Health Clinician visits: 1- Initial Visit  Session Start time: 0945   Session End time: 1030  Total time in minutes: 45   Referring Provider: PCP Patient/Family location: At home  Shari Collins Provider location: Remote Office All persons participating in visit: Patient and [facility] Types of Service: Individual psychotherapy  I connected with Shari Collins and/or Shari Collins's patient via  Telephone or Video Enabled Telemedicine Application  (Video is Caregility application) and verified that I am speaking with the correct person using two identifiers. Discussed confidentiality: Yes   I discussed the limitations of telemedicine and the availability of in person appointments.  Discussed there is a possibility of technology failure and discussed alternative modes of communication if that failure occurs.  I discussed that engaging in this telemedicine visit, they consent to the provision of behavioral healthcare and the services will be billed under their insurance.  Patient and/or legal guardian expressed understanding and consented to Telemedicine visit: Yes   Presenting Concerns: Patient and/or family reports the following symptoms/concerns: Increased depression and anxiety symptoms.  Duration of problem: Years; Severity of problem: moderate  Patient and/or Family's Strengths/Protective Factors: Concrete supports in place (healthy food, safe environments, etc.), Physical Health (exercise, healthy diet, medication compliance, etc.), and Caregiver has knowledge of parenting & child development  Goals Addressed: Patient will:  Reduce symptoms of: anxiety and depression   Increase knowledge and/or ability of: coping skills, healthy habits, and self-management skills   Demonstrate ability to: Increase healthy adjustment to current life circumstances and Increase  adequate support systems for patient/family  Progress towards Goals: Ongoing  Interventions: Interventions utilized:  Mindfulness or Management consultant, Supportive Counseling, Psychoeducation and/or Health Education, and Supportive Reflection Standardized Assessments completed: Not Needed  Patient and/or Family Response: Patient attended today's virtual session and reported ongoing struggles with depression and anxiety, as well as difficulties managing emotional triggers and life stressors.  Patient shared a history of depression and anxiety dating back to early life, including previous self-harm behaviors and suicide attempts. She described feeling frequently depressed and angry, with triggers such as others attitudes and daily stressors. She previously received mental health services at [facility] in [place] and [place].  Patient reported taking Celexa 40mg , which reduces her episodes of crying but has not significantly improved her anxiety. She continues to experience panic attacks. She expressed a desire to reconnect with music, though she noted that certain songs elicit sadness or anger. Patient was open to creating a playlist of favorite songs as a coping strategy.  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 She agreed to complete an application referral on the DSS website due to transportation challenges.    Assessment: Patient currently experiencing persistent depression and anxiety, marked by frequent emotional triggers such as life stressors and others attitudes. Despite taking Celexa, she continues to struggle with anxiety and panic attacks, and feels her current medications offers partial relief. She also faces financial stress, limited resources, and seeks ways to reconnect with positive coping strategies like music.    Patient may benefit from continued support from  integrated behavioral health to support healthy adjustment.  Plan: Follow up with behavioral health clinician on : 09/07/23 Behavioral recommendations: Lynee is encouraged to apply for foodstamps at DSS, make a playlist of your favorite songs to reduce and  mange stressors. Kortnee is encouraged to utilize relaxation strategies such as deep breathing and muscle relaxation to manage ongoing anxiety and panic attacks. Also follow up with your PCP regarding medications.  Referral(s): Integrated [employer] (In Clinic)  I discussed the assessment and treatment plan with the patient and/or parent/guardian. They were provided an opportunity to ask questions and all were answered. They agreed with the plan and demonstrated an understanding of the instructions.   They were advised to call back or seek an in-person evaluation if the symptoms worsen or if the condition fails to improve as anticipated.  Gloris Shiroma Cruzita Lederer, LCSWA

## 2023-09-07 ENCOUNTER — Ambulatory Visit: Payer: PRIVATE HEALTH INSURANCE | Admitting: Licensed Clinical Social Worker

## 2023-09-07 NOTE — BH Specialist Note (Signed)
[facility] sent virtual link to patient and provided a phone call on this date. A VM was left.

## 2023-09-09 NOTE — Progress Notes (Deleted)
  Cardiology Office Note:  .   Date:  09/09/2023  ID:  Shari Collins, DOB 05-27-1976, MRN [id number] PCP: Zarwolo, Gloria, FNP  [facility] Providers Cardiologist:  Alvan Carrier, MD { Click to update primary MD,subspecialty MD or APP then REFRESH:1}   History of Present Illness: .   Shari Collins is a 48 y.o. female  who saw Dr. Alvan for the first time 08/09/2020 with palpitations lasting a few seconds and associated with shortness of breath.  She was drinking 1 cup of coffee and 4 Mountain Dew's daily.  7-day monitor showed PACs, PVCs and SVT 12-second run was the longest consistent with atrial tachycardia.  Patient had video visit with PCP 07/2023 complaining of palpitations with increased life stressors. Metoprolol  restarted.  ROS: ***  Studies Reviewed: SABRA         Prior CV Studies: {Select studies to display:26339}  Study Highlights  5 days 19 hours monitor Min HR 49, Max HR 150, Avg HR 68 There were patient triggered events but no symptoms reported Rare supraventricular ectopy in the form of isolated PACs and couplets.Rare episodes of SVT lasting up to 12 seconds. SVT episodes consistent with atrial tachycardia Rare ventricular ectopy in the form of isolated PVCs and couplets    Risk Assessment/Calculations:   {Does this patient have ATRIAL FIBRILLATION?[phone]} No BP recorded.  {Refresh Note OR Click here to enter BP  :1}***       Physical Exam:   VS:  There were no vitals taken for this visit.   Wt Readings from Last 3 Encounters:  07/21/23 189 lb (85.7 kg)  07/19/23 187 lb (84.8 kg)  06/14/23 187 lb (84.8 kg)    GEN: Well nourished, well developed in no acute distress NECK: No JVD; No carotid bruits CARDIAC: ***RRR, no murmurs, rubs, gallops RESPIRATORY:  Clear to auscultation without rales, wheezing or rhonchi  ABDOMEN: Soft, non-tender, non-distended EXTREMITIES:  No edema; No deformity   ASSESSMENT AND PLAN: .    Palpitations in setting  of life stressors.prior 7 day monitor showed rare episodes of SVT     {Are you ordering a CV Procedure (e.g. stress test, cath, DCCV, TEE, etc)?   Press F2        :[id number]}  Dispo: ***  Signed, Olivia Pavy, PA-C

## 2023-09-14 ENCOUNTER — Encounter: Payer: Self-pay | Admitting: Physician Assistant

## 2023-09-14 ENCOUNTER — Ambulatory Visit: Payer: Medicaid Other | Attending: Cardiology | Admitting: Physician Assistant

## 2023-09-15 ENCOUNTER — Encounter: Payer: Self-pay | Admitting: Physician Assistant

## 2023-11-03 ENCOUNTER — Other Ambulatory Visit: Payer: Self-pay | Admitting: Family Medicine

## 2023-11-03 DIAGNOSIS — I10 Essential (primary) hypertension: Secondary | ICD-10-CM

## 2023-11-03 DIAGNOSIS — R7301 Impaired fasting glucose: Secondary | ICD-10-CM

## 2023-11-03 DIAGNOSIS — E1165 Type 2 diabetes mellitus with hyperglycemia: Secondary | ICD-10-CM

## 2023-11-03 MED ORDER — EMPAGLIFLOZIN 10 MG PO TABS: 10.0000 mg | ORAL_TABLET | Freq: Every day | ORAL | 0 refills | Status: AC

## 2023-11-03 MED ORDER — LISINOPRIL 10 MG PO TABS: ORAL_TABLET | ORAL | 2 refills | Status: AC

## 2023-11-03 MED ORDER — METFORMIN HCL 1000 MG PO TABS: 1000.0000 mg | ORAL_TABLET | Freq: Two times a day (BID) | ORAL | 0 refills | Status: AC

## 2023-11-03 NOTE — Telephone Encounter (Signed)
 Copied from CRM [id number]. Topic: Clinical - Medication Refill >> Nov 03, 2023 11:22 AM Antony Haste wrote: Most Recent Primary Care Visit:  Provider: Gilmore Laroche  Department: [facility]  Visit Type: MYCHART VIDEO VISIT  Date: 07/21/2023  Medication: metFORMIN (GLUCOPHAGE) 1000 MG tablet empagliflozin (JARDIANCE) 10 MG TABS tablet lisinopril (ZESTRIL) 10 MG tablet   Has the patient contacted their pharmacy? No (Agent: If no, request that the patient contact the pharmacy for the refill. If patient does not wish to contact the pharmacy document the reason why and proceed with request.) (Agent: If yes, when and what did the pharmacy advise?)  Is this the correct pharmacy for this prescription? Yes If no, delete pharmacy and type the correct one.  This is the patient's preferred pharmacy:   [facility] [address], [place] - [address] [place] [address] Phone: [phone] Fax: [phone]   Has the prescription been filled recently? No  Is the patient out of the medication? Yes  Has the patient been seen for an appointment in the last year OR does the patient have an upcoming appointment? No  Can we respond through MyChart? No, callback preferred.  Agent: Please be advised that Rx refills may take up to 3 business days. We ask that you follow-up with your pharmacy.

## 2024-03-13 ENCOUNTER — Other Ambulatory Visit: Payer: Self-pay | Admitting: Family Medicine

## 2024-03-13 DIAGNOSIS — E785 Hyperlipidemia, unspecified: Secondary | ICD-10-CM

## 2024-03-13 DIAGNOSIS — I10 Essential (primary) hypertension: Secondary | ICD-10-CM

## 2024-03-13 DIAGNOSIS — R7301 Impaired fasting glucose: Secondary | ICD-10-CM

## 2024-03-13 DIAGNOSIS — E569 Vitamin deficiency, unspecified: Secondary | ICD-10-CM

## 2024-03-13 DIAGNOSIS — F419 Anxiety disorder, unspecified: Secondary | ICD-10-CM

## 2024-03-13 DIAGNOSIS — E1165 Type 2 diabetes mellitus with hyperglycemia: Secondary | ICD-10-CM

## 2024-03-13 DIAGNOSIS — K219 Gastro-esophageal reflux disease without esophagitis: Secondary | ICD-10-CM

## 2024-03-13 NOTE — Telephone Encounter (Signed)
 Copied from CRM [id number]. Topic: Clinical - Medication Refill >> Mar 13, 2024 11:22 AM Shari Collins wrote: Medication: Patient states she needs refills on all medications:  atorvastatin  (LIPITOR) 20 MG tablet  blood glucose meter kit and supplies  citalopram  (CELEXA ) 40 MG tablet  empagliflozin  (JARDIANCE ) 10 MG TABS tablet  fenofibrate  (TRICOR ) 145 MG tablet  glucose blood (ACCU-CHEK GUIDE) test strip  lisinopril  (ZESTRIL ) 10 MG tablet  metFORMIN  (GLUCOPHAGE ) 1000 MG tablet  metoprolol  tartrate (LOPRESSOR ) 50 MG tablet  omeprazole  (PRILOSEC) 40 MG capsule  Vitamin D , Ergocalciferol , (DRISDOL ) 1.25 MG (50000 UNIT) CAPS capsule   Has the patient contacted their pharmacy? No, per patient states all bottles states there are no refills left (Agent: If no, request that the patient contact the pharmacy for the refill. If patient does not wish to contact the pharmacy document the reason why and proceed with request.) (Agent: If yes, when and what did the pharmacy advise?)  This is the patient's preferred pharmacy:   [facility] [address] [place] - [address] [place] [address] Phone: [phone] Fax: [phone]  Is this the correct pharmacy for this prescription? Yes If no, delete pharmacy and type the correct one.   Has the prescription been filled recently? No  Is the patient out of the medication? Yes  Has the patient been seen for an appointment in the last year OR does the patient have an upcoming appointment? Yes, last seen via mychart visit on 07-21-23  Can we respond through MyChart? No  Agent: Please be advised that Rx refills may take up to 3 business days. We ask that you follow-up with your pharmacy.
